# Patient Record
Sex: Male | Born: 1955
Health system: Southern US, Community
[De-identification: ages and names within clinical notes are randomized; demographics above are authoritative.]

## PROBLEM LIST (undated history)

## (undated) DIAGNOSIS — E7800 Pure hypercholesterolemia, unspecified: Secondary | ICD-10-CM

## (undated) DIAGNOSIS — R42 Dizziness and giddiness: Secondary | ICD-10-CM

## (undated) DIAGNOSIS — I10 Essential (primary) hypertension: Secondary | ICD-10-CM

---

## 2004-05-22 ENCOUNTER — Encounter: Admission: RE | Admit: 2004-05-22 | Discharge: 2004-05-22 | Payer: Self-pay | Admitting: Allergy and Immunology

## 2009-12-30 ENCOUNTER — Encounter: Admission: RE | Admit: 2009-12-30 | Discharge: 2009-12-30 | Payer: Self-pay | Admitting: Family Medicine

## 2011-06-21 ENCOUNTER — Other Ambulatory Visit: Payer: Self-pay | Admitting: Orthopaedic Surgery

## 2011-06-21 DIAGNOSIS — M25562 Pain in left knee: Secondary | ICD-10-CM

## 2011-06-26 ENCOUNTER — Ambulatory Visit
Admission: RE | Admit: 2011-06-26 | Discharge: 2011-06-26 | Disposition: A | Discharge status: Home / Self Care | Payer: Managed Care, Other (non HMO) | Source: Ambulatory Visit | Attending: Orthopaedic Surgery | Admitting: Orthopaedic Surgery

## 2011-06-26 DIAGNOSIS — M25562 Pain in left knee: Secondary | ICD-10-CM

## 2013-03-28 ENCOUNTER — Emergency Department (HOSPITAL_COMMUNITY): Payer: BC Managed Care – PPO

## 2013-03-28 ENCOUNTER — Emergency Department (HOSPITAL_COMMUNITY)
Admission: EM | Admit: 2013-03-28 | Discharge: 2013-03-28 | Disposition: A | Discharge status: Home / Self Care | Payer: BC Managed Care – PPO | Attending: Emergency Medicine | Admitting: Emergency Medicine

## 2013-03-28 ENCOUNTER — Encounter (HOSPITAL_COMMUNITY): Payer: Self-pay | Admitting: *Deleted

## 2013-03-28 DIAGNOSIS — I69998 Other sequelae following unspecified cerebrovascular disease: Secondary | ICD-10-CM | POA: Insufficient documentation

## 2013-03-28 DIAGNOSIS — Z79899 Other long term (current) drug therapy: Secondary | ICD-10-CM | POA: Insufficient documentation

## 2013-03-28 DIAGNOSIS — Z7982 Long term (current) use of aspirin: Secondary | ICD-10-CM | POA: Insufficient documentation

## 2013-03-28 DIAGNOSIS — I10 Essential (primary) hypertension: Secondary | ICD-10-CM | POA: Insufficient documentation

## 2013-03-28 DIAGNOSIS — R42 Dizziness and giddiness: Secondary | ICD-10-CM | POA: Insufficient documentation

## 2013-03-28 DIAGNOSIS — E7800 Pure hypercholesterolemia, unspecified: Secondary | ICD-10-CM | POA: Insufficient documentation

## 2013-03-28 HISTORY — DX: Essential (primary) hypertension: I10

## 2013-03-28 HISTORY — DX: Pure hypercholesterolemia, unspecified: E78.00

## 2013-03-28 LAB — URINALYSIS, ROUTINE W REFLEX MICROSCOPIC
Bilirubin Urine: NEGATIVE
Ketones, ur: NEGATIVE mg/dL
Nitrite: NEGATIVE

## 2013-03-28 LAB — CBC WITH DIFFERENTIAL/PLATELET
Basophils Absolute: 0.1 10*3/uL (ref 0.0–0.1)
Eosinophils Relative: 3 % (ref 0–5)
HCT: 37.7 % — ABNORMAL LOW (ref 39.0–52.0)
Hemoglobin: 13.5 g/dL (ref 13.0–17.0)
Lymphocytes Relative: 12 % (ref 12–46)
Lymphs Abs: 1.4 10*3/uL (ref 0.7–4.0)
MCHC: 35.8 g/dL (ref 30.0–36.0)
MCV: 85.1 fL (ref 78.0–100.0)
Monocytes Absolute: 0.8 10*3/uL (ref 0.1–1.0)
Monocytes Relative: 6 % (ref 3–12)
Neutro Abs: 9.3 10*3/uL — ABNORMAL HIGH (ref 1.7–7.7)
Neutrophils Relative %: 78 % — ABNORMAL HIGH (ref 43–77)
Platelets: 251 10*3/uL (ref 150–400)

## 2013-03-28 LAB — GLUCOSE, CAPILLARY: Glucose-Capillary: 133 mg/dL — ABNORMAL HIGH (ref 70–99)

## 2013-03-28 LAB — POCT I-STAT, CHEM 8
BUN: 21 mg/dL (ref 6–23)
Chloride: 102 mEq/L (ref 96–112)
Potassium: 3.1 mEq/L — ABNORMAL LOW (ref 3.5–5.1)
Sodium: 141 mEq/L (ref 135–145)

## 2013-03-28 LAB — URINE MICROSCOPIC-ADD ON

## 2013-03-28 NOTE — ED Notes (Signed)
Pt escorted to discharge window. Pt verbalized understanding discharge instructions. In no acute distress.  

## 2013-03-28 NOTE — Discharge Instructions (Signed)

## 2013-03-28 NOTE — ED Provider Notes (Signed)
History     CSN: [id number]  Arrival date & time 03/28/13  1245   First MD Initiated Contact with Patient 03/28/13 1516      Chief Complaint  Patient presents with  . Nausea  . not feeling well     (Consider location/radiation/quality/duration/timing/severity/associated sxs/prior treatment) The history is provided by the patient.   patient states that he was eating earlier today and began to feel "dizzy" he states he felt as if he was drunk. He states he then became nauseous and vomited. No chest pain. No lightheadedness. He states the nausea is much improved. He states he got up to walk and felt unsteady. No fevers. During his history of effusion. No difficulty seen. No numbness or weakness. He has not had episodes like this before. He states he was doing fine earlier today. He has history of hypertension high cholesterol. He has not had episodes like this before. Patient states that he vomited once.  Past Medical History  Diagnosis Date  . Hypertension   . High cholesterol     History reviewed. No pertinent past surgical history.  History reviewed. No pertinent family history.  History  Substance Use Topics  . Smoking status: Never Smoker   . Smokeless tobacco: Not on file  . Alcohol Use: Yes     Comment: socially      Review of Systems  Constitutional: Negative for activity change and appetite change.  HENT: Negative for neck stiffness.   Eyes: Negative for pain.  Respiratory: Negative for chest tightness and shortness of breath.   Cardiovascular: Negative for chest pain and leg swelling.  Gastrointestinal: Positive for nausea and vomiting. Negative for abdominal pain and diarrhea.  Genitourinary: Negative for flank pain.  Musculoskeletal: Negative for back pain.  Skin: Negative for rash.  Neurological: Positive for dizziness. Negative for weakness, numbness and headaches.  Psychiatric/Behavioral: Negative for behavioral problems.    Allergies   Penicillins  Home Medications   Current Outpatient Rx  Name  Route  Sig  Dispense  Refill  . aspirin 81 MG tablet   Oral   Take 81 mg by mouth daily.         . fenofibrate (TRICOR) 145 MG tablet   Oral   Take 145 mg by mouth daily.         Marland Kitchen lisinopril (PRINIVIL,ZESTRIL) 5 MG tablet   Oral   Take 5 mg by mouth daily.         Marland Kitchen zolpidem (AMBIEN) 5 MG tablet   Oral   Take 2.5 mg by mouth at bedtime as needed for sleep.           BP 167/95  Pulse 72  Temp(Src) 97.8 F (36.6 C) (Oral)  Resp 20  Ht 5\' 11"  (1.803 m)  Wt 238 lb (107.956 kg)  BMI 33.21 kg/m2  SpO2 100%  Physical Exam  Nursing note and vitals reviewed. Constitutional: He is oriented to person, place, and time. He appears well-developed and well-nourished.  HENT:  Head: Normocephalic and atraumatic.  Eyes: EOM are normal. Pupils are equal, round, and reactive to light.  Neck: Normal range of motion. Neck supple.  Cardiovascular: Normal rate, regular rhythm and normal heart sounds.   No murmur heard. Pulmonary/Chest: Effort normal and breath sounds normal.  Abdominal: Soft. Bowel sounds are normal. He exhibits no distension and no mass. There is no tenderness. There is no rebound and no guarding.  Musculoskeletal: Normal range of motion. He exhibits no edema.  Neurological:  He is alert and oriented to person, place, and time. No cranial nerve deficit. Coordination normal.  Extraocular movements intact. No nystagmus. No dizziness with rotating head. Finger-nose intact bilaterally. No Romberg. No pronator drift. He is able to ambulate normally.  Skin: Skin is warm and dry.  Psychiatric: He has a normal mood and affect.    ED Course  Procedures (including critical care time)  Labs Reviewed  GLUCOSE, CAPILLARY - Abnormal; Notable for the following:    Glucose-Capillary 133 (*)    All other components within normal limits  CBC WITH DIFFERENTIAL - Abnormal; Notable for the following:    WBC 11.9  (*)    HCT 37.7 (*)    Neutrophils Relative % 78 (*)    Neutro Abs 9.3 (*)    All other components within normal limits  URINALYSIS, ROUTINE W REFLEX MICROSCOPIC - Abnormal; Notable for the following:    Protein, ur 100 (*)    Leukocytes, UA TRACE (*)    All other components within normal limits  POCT I-STAT, CHEM 8 - Abnormal; Notable for the following:    Potassium 3.1 (*)    Glucose, Bld 121 (*)    All other components within normal limits  URINE MICROSCOPIC-ADD ON   Ct Head Wo Contrast  03/28/2013   *RADIOLOGY REPORT*  Clinical Data: Nausea.  Hypertension.  CT HEAD WITHOUT CONTRAST  Technique:  Contiguous axial images were obtained from the base of the skull through the vertex without contrast.  Comparison: None.  Findings: There is no evidence of acute intracranial abnormality including infarction, hemorrhage, mass lesion, mass effect, midline shift or abnormal extra-axial fluid collection.  No hydrocephalus or pneumocephalus.  The calvarium is intact.  Small mucous retention cyst or polyp in a posterior left ethmoid air cell is noted.  IMPRESSION: No acute finding.   Original Report Authenticated By: Holley Dexter, M.D.     1. Vertigo     Date: 03/28/2013  Rate: 58  Rhythm: normal sinus rhythm  QRS Axis: normal  Intervals: normal  ST/T Wave abnormalities: normal  Conduction Disutrbances:none  Narrative Interpretation: Q waves anteriorly.   Old EKG Reviewed: unchanged     MDM  Patient presents with dizziness. Normal exam now. States he felt drunk. Later states that he had a ringing in his ears. EKg and labs reassuring. Ct negative. Posterior stroke felt less likely. Will d/c with PCP follow up.   Juliet Rude. Rubin Payor, MD 03/28/13 [phone]

## 2013-03-28 NOTE — ED Notes (Signed)
Pt reports since 0800 he has been nauseas, clammy, and not feeling well. Reports he is diabetic but not at the point that he needs to take medications. Denies pain.

## 2013-08-21 ENCOUNTER — Emergency Department (HOSPITAL_COMMUNITY)
Admission: EM | Admit: 2013-08-21 | Discharge: 2013-08-21 | Disposition: A | Discharge status: Home / Self Care | Payer: BC Managed Care – PPO | Attending: Emergency Medicine | Admitting: Emergency Medicine

## 2013-08-21 DIAGNOSIS — Y9389 Activity, other specified: Secondary | ICD-10-CM | POA: Insufficient documentation

## 2013-08-21 DIAGNOSIS — I10 Essential (primary) hypertension: Secondary | ICD-10-CM | POA: Insufficient documentation

## 2013-08-21 DIAGNOSIS — E7800 Pure hypercholesterolemia, unspecified: Secondary | ICD-10-CM | POA: Insufficient documentation

## 2013-08-21 DIAGNOSIS — Z7982 Long term (current) use of aspirin: Secondary | ICD-10-CM | POA: Insufficient documentation

## 2013-08-21 DIAGNOSIS — W260XXA Contact with knife, initial encounter: Secondary | ICD-10-CM | POA: Insufficient documentation

## 2013-08-21 DIAGNOSIS — S61219A Laceration without foreign body of unspecified finger without damage to nail, initial encounter: Secondary | ICD-10-CM

## 2013-08-21 DIAGNOSIS — Z880 Allergy status to penicillin: Secondary | ICD-10-CM | POA: Insufficient documentation

## 2013-08-21 DIAGNOSIS — S61209A Unspecified open wound of unspecified finger without damage to nail, initial encounter: Secondary | ICD-10-CM | POA: Insufficient documentation

## 2013-08-21 DIAGNOSIS — Z79899 Other long term (current) drug therapy: Secondary | ICD-10-CM | POA: Insufficient documentation

## 2013-08-21 DIAGNOSIS — Y929 Unspecified place or not applicable: Secondary | ICD-10-CM | POA: Insufficient documentation

## 2013-08-21 NOTE — ED Notes (Signed)
Pt states he cut his L index finger skinning a deer. Pt states he has a lac to knuckle on L index finger. Bleeding controlled. Pt states he is up to date on his tetanus status.

## 2013-08-21 NOTE — ED Provider Notes (Signed)
CSN: [id number]     Arrival date & time 08/21/13  2058 History   First MD Initiated Contact with Patient 08/21/13 2112    This chart was scribed for Earley Favor NP, a non-physician practitioner working with Gavin Pound. Oletta Lamas, MD by Lewanda Rife, ED Scribe. This patient was seen in room WTR8/WTR8 and the patient's care was started at 9:44 PM     Chief Complaint  Patient presents with  . Extremity Laceration   (Consider location/radiation/quality/duration/timing/severity/associated sxs/prior Treatment) The history is provided by the patient. No language interpreter was used.   HPI Comments: Jonathan Fowler is a 57 y.o. male who presents to the Emergency Department complaining of posterior proximal left finger 1 cm laceration onset PTA with a skinning knife while hunting and skinning a deer. Reports associated constant mild pain to site. Denies any aggravating or alleviating factors. Denies associated other injuries. Reports tetanus is up to date.    Past Medical History  Diagnosis Date  . Hypertension   . High cholesterol    No past surgical history on file. No family history on file. History  Substance Use Topics  . Smoking status: Never Smoker   . Smokeless tobacco: Not on file  . Alcohol Use: Yes     Comment: socially    Review of Systems  Skin: Positive for wound.  All other systems reviewed and are negative.  A complete 10 system review of systems was obtained and all systems are negative except as noted in the HPI and PMHx.     Allergies  Penicillins  Home Medications   Current Outpatient Rx  Name  Route  Sig  Dispense  Refill  . aspirin 81 MG tablet   Oral   Take 81 mg by mouth every evening.          . fenofibrate (TRICOR) 145 MG tablet   Oral   Take 145 mg by mouth every evening.          Marland Kitchen lisinopril (PRINIVIL,ZESTRIL) 5 MG tablet   Oral   Take 5 mg by mouth every evening.          . potassium chloride (K-DUR) 10 MEQ tablet   Oral    Take 10 mEq by mouth 2 (two) times daily.         Marland Kitchen zolpidem (AMBIEN) 5 MG tablet   Oral   Take 2.5 mg by mouth at bedtime as needed for sleep.          BP 163/87  Pulse 80  Temp(Src) 99.1 F (37.3 C) (Oral)  Resp 16  SpO2 98% Physical Exam  Nursing note and vitals reviewed. Constitutional: He is oriented to person, place, and time. He appears well-developed and well-nourished. No distress.  HENT:  Head: Normocephalic and atraumatic.  Eyes: EOM are normal.  Neck: Neck supple. No tracheal deviation present.  Cardiovascular: Normal rate.   Pulmonary/Chest: Effort normal. No respiratory distress.  Musculoskeletal: Normal range of motion.  Neurological: He is alert and oriented to person, place, and time.  Skin: Skin is warm and dry.  Diagonal laceration along proximal left index  Psychiatric: He has a normal mood and affect. His behavior is normal.    ED Course  Procedures (including critical care time) COORDINATION OF CARE:  Nursing notes reviewed. Vital signs reviewed. Initial pt interview and examination performed.   9:44 PM-Discussed treatment plan with pt at bedside, which includes laceration repair. Pt agrees with plan.  LACERATION REPAIR Performed by: Earley Favor  NP Consent: Verbal consent obtained. Risks and benefits: risks, benefits and alternatives were discussed Patient identity confirmed: provided demographic data Time out performed prior to procedure Prepped and Draped in normal sterile fashion Wound explored Laceration Location: posterior proximal left index finger  Laceration Length: 1 cm No Foreign Bodies seen or palpated Anesthesia: local infiltration Local anesthetic: lidocaine 2% without epinephrine Anesthetic total: 1 ml Irrigation method: syringe Amount of cleaning: standard Skin closure: 5-0 Prolene Number of sutures or staples: 3 sutures Technique: simple interrupted Patient tolerance: Patient tolerated the procedure well with no  immediate complications.  Treatment plan initiated:Medications - No data to display   Initial diagnostic testing ordered.    Labs Review Labs Reviewed - No data to display Imaging Review No results found.  EKG Interpretation   None       MDM   1. Laceration of finger, initial encounter    Laceration if finger  I personally performed the services described in this documentation, which was scribed in my presence. The recorded information has been reviewed and is accurate.   Arman Filter, NP 08/21/13 2144

## 2013-08-22 NOTE — ED Provider Notes (Signed)
Medical screening examination/treatment/procedure(s) were performed by non-physician practitioner and as supervising physician I was immediately available for consultation/collaboration.  EKG Interpretation   None         Gavin Pound. Oletta Lamas, MD 08/22/13 8119

## 2015-07-04 ENCOUNTER — Other Ambulatory Visit (HOSPITAL_COMMUNITY): Payer: Self-pay | Admitting: Nephrology

## 2015-07-05 ENCOUNTER — Other Ambulatory Visit (HOSPITAL_COMMUNITY): Payer: Self-pay | Admitting: Nephrology

## 2015-07-05 DIAGNOSIS — E269 Hyperaldosteronism, unspecified: Secondary | ICD-10-CM

## 2015-07-12 ENCOUNTER — Ambulatory Visit (HOSPITAL_COMMUNITY): Payer: Self-pay

## 2015-07-21 ENCOUNTER — Ambulatory Visit (HOSPITAL_COMMUNITY): Payer: BLUE CROSS/BLUE SHIELD

## 2015-07-28 ENCOUNTER — Ambulatory Visit (HOSPITAL_COMMUNITY)
Admission: RE | Admit: 2015-07-28 | Discharge: 2015-07-28 | Disposition: A | Discharge status: Home / Self Care | Payer: BLUE CROSS/BLUE SHIELD | Source: Ambulatory Visit | Attending: Nephrology | Admitting: Nephrology

## 2015-07-28 DIAGNOSIS — K5730 Diverticulosis of large intestine without perforation or abscess without bleeding: Secondary | ICD-10-CM | POA: Diagnosis not present

## 2015-07-28 DIAGNOSIS — R109 Unspecified abdominal pain: Secondary | ICD-10-CM | POA: Insufficient documentation

## 2015-07-28 DIAGNOSIS — I70203 Unspecified atherosclerosis of native arteries of extremities, bilateral legs: Secondary | ICD-10-CM | POA: Insufficient documentation

## 2015-07-28 DIAGNOSIS — I2510 Atherosclerotic heart disease of native coronary artery without angina pectoris: Secondary | ICD-10-CM | POA: Diagnosis not present

## 2015-07-28 DIAGNOSIS — M5136 Other intervertebral disc degeneration, lumbar region: Secondary | ICD-10-CM | POA: Insufficient documentation

## 2015-07-28 DIAGNOSIS — K429 Umbilical hernia without obstruction or gangrene: Secondary | ICD-10-CM | POA: Diagnosis not present

## 2015-07-28 DIAGNOSIS — N400 Enlarged prostate without lower urinary tract symptoms: Secondary | ICD-10-CM | POA: Diagnosis not present

## 2015-07-28 DIAGNOSIS — E269 Hyperaldosteronism, unspecified: Secondary | ICD-10-CM

## 2015-07-28 DIAGNOSIS — E279 Disorder of adrenal gland, unspecified: Secondary | ICD-10-CM | POA: Diagnosis not present

## 2016-05-22 ENCOUNTER — Other Ambulatory Visit: Payer: Self-pay | Admitting: Orthopedic Surgery

## 2016-05-22 DIAGNOSIS — M25532 Pain in left wrist: Secondary | ICD-10-CM

## 2016-05-29 ENCOUNTER — Ambulatory Visit
Admission: RE | Admit: 2016-05-29 | Discharge: 2016-05-29 | Disposition: A | Discharge status: Home / Self Care | Payer: BLUE CROSS/BLUE SHIELD | Source: Ambulatory Visit | Attending: Orthopedic Surgery | Admitting: Orthopedic Surgery

## 2016-05-29 DIAGNOSIS — M25532 Pain in left wrist: Secondary | ICD-10-CM

## 2016-10-31 DIAGNOSIS — R6889 Other general symptoms and signs: Secondary | ICD-10-CM | POA: Diagnosis not present

## 2016-10-31 DIAGNOSIS — R5383 Other fatigue: Secondary | ICD-10-CM | POA: Diagnosis not present

## 2016-10-31 DIAGNOSIS — R69 Illness, unspecified: Secondary | ICD-10-CM | POA: Diagnosis not present

## 2016-11-04 DIAGNOSIS — R69 Illness, unspecified: Secondary | ICD-10-CM | POA: Diagnosis not present

## 2016-11-04 DIAGNOSIS — R030 Elevated blood-pressure reading, without diagnosis of hypertension: Secondary | ICD-10-CM | POA: Diagnosis not present

## 2016-11-18 DIAGNOSIS — I10 Essential (primary) hypertension: Secondary | ICD-10-CM | POA: Diagnosis not present

## 2016-11-18 DIAGNOSIS — E782 Mixed hyperlipidemia: Secondary | ICD-10-CM | POA: Diagnosis not present

## 2016-11-18 DIAGNOSIS — E119 Type 2 diabetes mellitus without complications: Secondary | ICD-10-CM | POA: Diagnosis not present

## 2016-11-18 DIAGNOSIS — E269 Hyperaldosteronism, unspecified: Secondary | ICD-10-CM | POA: Diagnosis not present

## 2017-01-13 DIAGNOSIS — E269 Hyperaldosteronism, unspecified: Secondary | ICD-10-CM | POA: Diagnosis not present

## 2017-01-13 DIAGNOSIS — R809 Proteinuria, unspecified: Secondary | ICD-10-CM | POA: Diagnosis not present

## 2017-01-13 DIAGNOSIS — I152 Hypertension secondary to endocrine disorders: Secondary | ICD-10-CM | POA: Diagnosis not present

## 2017-04-28 DIAGNOSIS — L237 Allergic contact dermatitis due to plants, except food: Secondary | ICD-10-CM | POA: Diagnosis not present

## 2017-04-28 DIAGNOSIS — I10 Essential (primary) hypertension: Secondary | ICD-10-CM | POA: Diagnosis not present

## 2017-05-17 DIAGNOSIS — R21 Rash and other nonspecific skin eruption: Secondary | ICD-10-CM | POA: Diagnosis not present

## 2017-05-23 DIAGNOSIS — R809 Proteinuria, unspecified: Secondary | ICD-10-CM | POA: Diagnosis not present

## 2017-05-23 DIAGNOSIS — E269 Hyperaldosteronism, unspecified: Secondary | ICD-10-CM | POA: Diagnosis not present

## 2017-05-23 DIAGNOSIS — I152 Hypertension secondary to endocrine disorders: Secondary | ICD-10-CM | POA: Diagnosis not present

## 2017-06-13 DIAGNOSIS — E269 Hyperaldosteronism, unspecified: Secondary | ICD-10-CM | POA: Diagnosis not present

## 2017-06-23 DIAGNOSIS — E782 Mixed hyperlipidemia: Secondary | ICD-10-CM | POA: Diagnosis not present

## 2017-06-23 DIAGNOSIS — E119 Type 2 diabetes mellitus without complications: Secondary | ICD-10-CM | POA: Diagnosis not present

## 2017-06-23 DIAGNOSIS — Z23 Encounter for immunization: Secondary | ICD-10-CM | POA: Diagnosis not present

## 2017-06-23 DIAGNOSIS — I10 Essential (primary) hypertension: Secondary | ICD-10-CM | POA: Diagnosis not present

## 2017-08-11 ENCOUNTER — Ambulatory Visit (INDEPENDENT_AMBULATORY_CARE_PROVIDER_SITE_OTHER): Payer: 59

## 2017-08-11 ENCOUNTER — Encounter (INDEPENDENT_AMBULATORY_CARE_PROVIDER_SITE_OTHER): Payer: Self-pay | Admitting: Physician Assistant

## 2017-08-11 ENCOUNTER — Ambulatory Visit (INDEPENDENT_AMBULATORY_CARE_PROVIDER_SITE_OTHER): Payer: 59 | Admitting: Physician Assistant

## 2017-08-11 DIAGNOSIS — M7022 Olecranon bursitis, left elbow: Secondary | ICD-10-CM

## 2017-08-11 DIAGNOSIS — M25522 Pain in left elbow: Secondary | ICD-10-CM | POA: Diagnosis not present

## 2017-08-11 MED ORDER — METHYLPREDNISOLONE ACETATE 40 MG/ML IJ SUSP
40.0000 mg | INTRAMUSCULAR | Status: AC | PRN
  Administered 2017-08-11: 40 mg

## 2017-08-11 MED ORDER — LIDOCAINE HCL 1 % IJ SOLN
0.3000 mL | INTRAMUSCULAR | Status: AC | PRN
  Administered 2017-08-11: .3 mL

## 2017-08-11 NOTE — Progress Notes (Signed)
Office Visit Note   Patient: Jonathan Fowler           Date of Birth: [birth year]           MRN: [id number] Visit Date: 08/11/2017              Requested by: No referring provider defined for this encounter. PCP: No primary care provider on file.   Assessment & Plan: Visit Diagnoses:  1. Pain in left elbow     Plan: Left elbow is wrapped with an Ace bandage post aspiration injection.  We will leave this in fact until this evening and then remove it.  I talked with him about keeping pressure off the elbow.  He will follow-up with us in 2 weeks to check his progress lack of.  However he states that he will cancel appointment if he is doing well.  Questions were encouraged and answered by myself and Dr. Magnus IvanBlackman.  Follow-Up Instructions: No Follow-up on file.   Orders:  Orders Placed This Encounter  Procedures  . Hand/Upper Extremity Injection/Arthrocentesis  . XR Elbow 2 Views Left   No orders of the defined types were placed in this encounter.     Procedures: Hand/UE Inj Date/Time: 08/11/2017 1:53 PM Performed by: Kirtland BouchardLARK, Vernessa Likes W Authorized by: Kirtland BouchardLARK, Neleh Muldoon W   Consent Given by:  Patient Site marked: the procedure site was marked   Indications:  Joint swelling Condition comment:  Olecranon bursitis Needle Size:  25 G Approach:  Dorsal Medications:  40 mg methylPREDNISolone acetate 40 MG/ML; 0.3 mL lidocaine 1 % Aspirate amount (mL):  35 Aspirate:  Blood-tinged Patient tolerance:  Patient tolerated the procedure well with no immediate complications     Clinical Data: No additional findings.   Subjective: Elbow swelling  HPI Patient is a 61 year old male who comes in today with left elbow swelling for the past 2-3 weeks.  He is having no pain.  Had no fevers chills.  He does state that he hit the elbow on a dumpster.  No other injury.  Had no treatment for this. Review of Systems No fevers, chills, shortness of breath OR chest pain.  Objective: Vital  Signs: There were no vitals taken for this visit.  Physical Exam  Constitutional: He is oriented to person, place, and time. He appears well-developed and well-nourished. No distress.  Pulmonary/Chest: Effort normal.  Neurological: He is alert and oriented to person, place, and time.  Skin: He is not diaphoretic.  Psychiatric: He has a normal mood and affect.    Ortho Exam Left elbow olecranon bursitis.  No significant erythema positive edema.  Good range of motion the elbow and forearm without pain.  No rashes skin lesions ulcerations erythema.  No expressible purulence. Specialty Comments:  No specialty comments available.  Imaging: Xr Elbow 2 Views Left  Result Date: 08/11/2017 Left elbow AP lateral views: Elbow is well located.  No arthritic changes.  No evidence of avulsion or acute fracture.  No bony abnormalities otherwise.    PMFS History: There are no active problems to display for this patient.  Past Medical History:  Diagnosis Date  . High cholesterol   . Hypertension     No family history on file.  No past surgical history on file. Social History   Occupational History  . Not on file.   Social History Main Topics  . Smoking status: Never Smoker  . Smokeless tobacco: Not on file  . Alcohol use Yes  Comment: socially  . Drug use: No  . Sexual activity: Not on file

## 2017-08-25 ENCOUNTER — Ambulatory Visit (INDEPENDENT_AMBULATORY_CARE_PROVIDER_SITE_OTHER): Payer: 59 | Admitting: Orthopaedic Surgery

## 2017-08-25 ENCOUNTER — Encounter (INDEPENDENT_AMBULATORY_CARE_PROVIDER_SITE_OTHER): Payer: Self-pay | Admitting: Orthopaedic Surgery

## 2017-08-25 DIAGNOSIS — M7022 Olecranon bursitis, left elbow: Secondary | ICD-10-CM | POA: Insufficient documentation

## 2017-08-25 NOTE — Progress Notes (Signed)
The patient is following up for his left elbow olecranon bursitis.  At his last visit we drained about 35 cc of fluid off the left elbow area and place a steroid in it.  He has had some reaccumulation but overall is doing well.  On examination of his elbow there is no evidence of infection.  His elbow range of motion is full.  There is still some fluid in the olecranon bursa area but is not is much as before.  I was able to place an 18-gauge needle and aspirate only about 5 cc of bloody fluid from the olecranon area and express more from it as well.  This completely decompressed this area.  He will follow-up as needed however if he has a reaccumulation of fluid in the left elbow we can see him back and drain it one more time with placing 1 more steroid injection if needed.  All questions concerns were answered and addressed.

## 2017-10-29 ENCOUNTER — Ambulatory Visit (INDEPENDENT_AMBULATORY_CARE_PROVIDER_SITE_OTHER): Payer: 59 | Admitting: Physician Assistant

## 2017-10-29 ENCOUNTER — Ambulatory Visit (INDEPENDENT_AMBULATORY_CARE_PROVIDER_SITE_OTHER): Payer: 59

## 2017-10-29 ENCOUNTER — Encounter (INDEPENDENT_AMBULATORY_CARE_PROVIDER_SITE_OTHER): Payer: Self-pay | Admitting: Physician Assistant

## 2017-10-29 DIAGNOSIS — M25562 Pain in left knee: Secondary | ICD-10-CM | POA: Diagnosis not present

## 2017-10-29 DIAGNOSIS — G8929 Other chronic pain: Secondary | ICD-10-CM | POA: Diagnosis not present

## 2017-10-29 MED ORDER — METHYLPREDNISOLONE ACETATE 40 MG/ML IJ SUSP
40.0000 mg | INTRAMUSCULAR | Status: AC | PRN
  Administered 2017-10-29: 40 mg via INTRA_ARTICULAR

## 2017-10-29 MED ORDER — LIDOCAINE HCL 1 % IJ SOLN
5.0000 mL | INTRAMUSCULAR | Status: AC | PRN
  Administered 2017-10-29: 5 mL

## 2017-10-29 NOTE — Progress Notes (Signed)
Office Visit Note   Patient: Jonathan Fowler           Date of Birth: 1956/07/08           MRN: [id number] Visit Date: 10/29/2017              Requested by: No referring provider defined for this encounter. PCP: Jonathan Fowler, [place], MD   Assessment & Plan: Visit Diagnoses:  1. Chronic pain of left knee     Plan: Discussed with him knee friendly exercises.  Demonstrated some quad strengthening exercises and had him redemonstrate days.  Discussed over-the-counter anti-inflammatories and natural anti-inflammatories.  He will continue his Aleve and glucosamine.  Also discussed with him supplemental injections and handout was given on supplemental injections.  He will follow-up on as-needed basis pain persist or becomes worse.  Questions encouraged and answered at length.  Follow-Up Instructions: Return if symptoms worsen or fail to improve.   Orders:  Orders Placed This Encounter  Procedures  . Large Joint Inj  . XR KNEE 3 VIEW LEFT   No orders of the defined types were placed in this encounter.     Procedures: Large Joint Inj: L knee on 10/29/2017 8:08 PM Indications: pain Details: 22 G 1.5 in needle, anterolateral approach  Arthrogram: No  Medications: 40 mg methylPREDNISolone acetate 40 MG/ML; 5 mL lidocaine 1 % Aspirate: 12 mL yellow Outcome: tolerated well, no immediate complications Procedure, treatment alternatives, risks and benefits explained, specific risks discussed. Consent was given by the patient. Immediately prior to procedure a time out was called to verify the correct patient, procedure, equipment, support staff and site/side marked as required. Patient was prepped and draped in the usual sterile fashion.       Clinical Data: No additional findings.   Subjective: Chief Complaint  Patient presents with  . Left Knee - Pain    HPI Jonathan Fowler 62 year old male is well-known to Dr. Magnus Fowler service comes in today due to left knee pain secondary to  fall onto the knee.  Now he is having knee pain especially with kneeling down.  He notes some giving way .  Denies painful popping, locking catching.  He is tried Aleve with some relief.  States the knee feels stiff. Review of Systems No fevers chills chest pain shortness of breath.  Objective: Vital Signs: There were no vitals taken for this visit.  Physical Exam  Constitutional: He is oriented to person, place, and time. He appears well-developed and well-nourished. No distress.  Pulmonary/Chest: Effort normal.  Neurological: He is alert and oriented to person, place, and time.  Skin: He is not diaphoretic.  Psychiatric: He has a normal mood and affect.    Ortho Exam Bilateral knees no instability valgus varus stressing.  Is able to do straight leg raise bilaterally.  McMurray's is negative bilaterally.  Left knee with tenderness along medial joint line.  Slight effusion left knee.  Right knee with slight tenderness along medial joint line. Specialty Comments:  No specialty comments available.  Imaging: Xr Knee 3 View Left  Result Date: 10/29/2017 3 views left knee: No acute fractures.  Near bone-on-bone medial compartment.  Mild lateral compartmental changes.  Moderate patellofemoral changes.  No other bony abnormalities.  Slight lateralization of the patella on the sunrise view.    PMFS History: Patient Active Problem List   Diagnosis Date Noted  . Olecranon bursitis of left elbow 08/25/2017   Past Medical History:  Diagnosis Date  . High cholesterol   .  Hypertension     History reviewed. No pertinent family history.  History reviewed. No pertinent surgical history. Social History   Occupational History  . Not on file  Tobacco Use  . Smoking status: Never Smoker  . Smokeless tobacco: Never Used  Substance and Sexual Activity  . Alcohol use: Yes    Comment: socially  . Drug use: No  . Sexual activity: Not on file

## 2017-11-01 DIAGNOSIS — L237 Allergic contact dermatitis due to plants, except food: Secondary | ICD-10-CM | POA: Diagnosis not present

## 2017-11-06 DIAGNOSIS — I152 Hypertension secondary to endocrine disorders: Secondary | ICD-10-CM | POA: Diagnosis not present

## 2017-11-06 DIAGNOSIS — E1121 Type 2 diabetes mellitus with diabetic nephropathy: Secondary | ICD-10-CM | POA: Diagnosis not present

## 2017-11-06 DIAGNOSIS — N2581 Secondary hyperparathyroidism of renal origin: Secondary | ICD-10-CM | POA: Diagnosis not present

## 2017-11-12 DIAGNOSIS — I152 Hypertension secondary to endocrine disorders: Secondary | ICD-10-CM | POA: Diagnosis not present

## 2017-11-12 DIAGNOSIS — N2581 Secondary hyperparathyroidism of renal origin: Secondary | ICD-10-CM | POA: Diagnosis not present

## 2017-11-12 DIAGNOSIS — R809 Proteinuria, unspecified: Secondary | ICD-10-CM | POA: Diagnosis not present

## 2018-01-16 DIAGNOSIS — L255 Unspecified contact dermatitis due to plants, except food: Secondary | ICD-10-CM | POA: Diagnosis not present

## 2018-04-02 DIAGNOSIS — R809 Proteinuria, unspecified: Secondary | ICD-10-CM | POA: Diagnosis not present

## 2018-04-02 DIAGNOSIS — I152 Hypertension secondary to endocrine disorders: Secondary | ICD-10-CM | POA: Diagnosis not present

## 2018-04-06 DIAGNOSIS — R809 Proteinuria, unspecified: Secondary | ICD-10-CM | POA: Diagnosis not present

## 2018-04-06 DIAGNOSIS — N2581 Secondary hyperparathyroidism of renal origin: Secondary | ICD-10-CM | POA: Diagnosis not present

## 2018-04-06 DIAGNOSIS — I152 Hypertension secondary to endocrine disorders: Secondary | ICD-10-CM | POA: Diagnosis not present

## 2018-05-06 DIAGNOSIS — L255 Unspecified contact dermatitis due to plants, except food: Secondary | ICD-10-CM | POA: Diagnosis not present

## 2018-05-06 DIAGNOSIS — I10 Essential (primary) hypertension: Secondary | ICD-10-CM | POA: Diagnosis not present

## 2018-05-14 ENCOUNTER — Emergency Department (HOSPITAL_COMMUNITY)
Admission: EM | Admit: 2018-05-14 | Discharge: 2018-05-14 | Disposition: A | Discharge status: Home / Self Care | Payer: 59 | Attending: Emergency Medicine | Admitting: Emergency Medicine

## 2018-05-14 ENCOUNTER — Emergency Department (HOSPITAL_COMMUNITY): Payer: 59

## 2018-05-14 ENCOUNTER — Encounter (HOSPITAL_COMMUNITY): Payer: Self-pay | Admitting: Emergency Medicine

## 2018-05-14 DIAGNOSIS — I10 Essential (primary) hypertension: Secondary | ICD-10-CM | POA: Insufficient documentation

## 2018-05-14 DIAGNOSIS — Z7982 Long term (current) use of aspirin: Secondary | ICD-10-CM | POA: Insufficient documentation

## 2018-05-14 DIAGNOSIS — R42 Dizziness and giddiness: Secondary | ICD-10-CM | POA: Diagnosis present

## 2018-05-14 DIAGNOSIS — R51 Headache: Secondary | ICD-10-CM | POA: Insufficient documentation

## 2018-05-14 DIAGNOSIS — Z79899 Other long term (current) drug therapy: Secondary | ICD-10-CM | POA: Insufficient documentation

## 2018-05-14 HISTORY — DX: Dizziness and giddiness: R42

## 2018-05-14 LAB — CBC
HCT: 43.6 % (ref 39.0–52.0)
Hemoglobin: 14.6 g/dL (ref 13.0–17.0)
MCH: 30.2 pg (ref 26.0–34.0)
MCHC: 33.5 g/dL (ref 30.0–36.0)
MCV: 90.3 fL (ref 78.0–100.0)
PLATELETS: 218 10*3/uL (ref 150–400)
RBC: 4.83 MIL/uL (ref 4.22–5.81)
RDW: 12.1 % (ref 11.5–15.5)
WBC: 7.7 10*3/uL (ref 4.0–10.5)

## 2018-05-14 LAB — BASIC METABOLIC PANEL
Anion gap: 9 (ref 5–15)
BUN: 16 mg/dL (ref 8–23)
CALCIUM: 9.3 mg/dL (ref 8.9–10.3)
CO2: 26 mmol/L (ref 22–32)
CREATININE: 1 mg/dL (ref 0.61–1.24)
Chloride: 104 mmol/L (ref 98–111)
GFR calc non Af Amer: >60 mL/min (ref >60)
GLUCOSE: 131 mg/dL — AB (ref 70–99)
Potassium: 3.6 mmol/L (ref 3.5–5.1)
Sodium: 139 mmol/L (ref 135–145)

## 2018-05-14 LAB — URINALYSIS, ROUTINE W REFLEX MICROSCOPIC
Bacteria, UA: NONE SEEN
Bilirubin Urine: NEGATIVE
GLUCOSE, UA: NEGATIVE mg/dL
Hgb urine dipstick: NEGATIVE
Ketones, ur: NEGATIVE mg/dL
Nitrite: NEGATIVE
PROTEIN: 30 mg/dL — AB
Specific Gravity, Urine: 1.011 (ref 1.005–1.030)
pH: 5 (ref 5.0–8.0)

## 2018-05-14 LAB — TROPONIN I

## 2018-05-14 LAB — CBG MONITORING, ED: Glucose-Capillary: 99 mg/dL (ref 70–99)

## 2018-05-14 MED ORDER — ACETAMINOPHEN 325 MG PO TABS
650.0000 mg | ORAL_TABLET | Freq: Once | ORAL | Status: AC
  Administered 2018-05-14: 650 mg via ORAL
  Filled 2018-05-14: qty 2

## 2018-05-14 MED ORDER — METOCLOPRAMIDE HCL 5 MG/ML IJ SOLN
5.0000 mg | Freq: Once | INTRAMUSCULAR | Status: AC
  Administered 2018-05-14: 5 mg via INTRAMUSCULAR
  Filled 2018-05-14: qty 2

## 2018-05-14 MED ORDER — ONDANSETRON 4 MG PO TBDP: 4.0000 mg | ORAL_TABLET | Freq: Three times a day (TID) | ORAL | 0 refills | Status: DC | PRN

## 2018-05-14 MED ORDER — MECLIZINE HCL 25 MG PO TABS: 25.0000 mg | ORAL_TABLET | Freq: Three times a day (TID) | ORAL | 0 refills | Status: DC | PRN

## 2018-05-14 MED ORDER — DIPHENHYDRAMINE HCL 50 MG/ML IJ SOLN
25.0000 mg | Freq: Once | INTRAMUSCULAR | Status: AC
  Administered 2018-05-14: 25 mg via INTRAMUSCULAR
  Filled 2018-05-14: qty 1

## 2018-05-14 NOTE — ED Triage Notes (Signed)
Pt reports for the last week he has felt like he had no energy. Pt states when he woke up this morning and stood up he felt dizzy and off balance. Pt took bp at home and was 170's sp came to ED. Pt ambulatory no trouble walking and no weakness or numbness. Pt also has headache 3/10.

## 2018-05-14 NOTE — ED Notes (Signed)
Pt ambulated with no issues. Pt reports no dizziness,.

## 2018-05-14 NOTE — ED Notes (Signed)
Pt alert and oriented in NAD. Pt verbalized understanding of discharge instructions. 

## 2018-05-14 NOTE — Discharge Instructions (Signed)
Take the prescriptions as directed. Continue to take your usual medications as previously directed.  Call your regular medical doctor tomorrow to schedule a follow up appointment within the next 3 days. Call the ENT doctor tomorrow to schedule a follow up appointment within the next week.  Return to the Emergency Department immediately sooner if worsening.

## 2018-05-14 NOTE — ED Notes (Signed)
Requested pain medication for pt headache from MD. Awaiting orders.

## 2018-05-14 NOTE — ED Notes (Signed)
Patient transported to CT 

## 2018-05-14 NOTE — ED Provider Notes (Signed)
[facility] EMERGENCY DEPARTMENT Provider Note   CSN: [id number] Arrival date & time: 05/14/18  0945     History   Chief Complaint Chief Complaint  Patient presents with  . Dizziness    HPI Jonathan Fowler is a 62 y.o. male.  HPI  Pt was seen at 1330. Per pt and his wife, c/o sudden onset and resolution of one episode of "dizziness" that occurred when he woke up this morning approximately 7am. Pt states he woke up, stood up and felt "dizzy" and "off balance." Pt describes the dizziness as "everything was moving."  Pt states his symptoms have resolved by arrival to ED. Pt states he only has a dull aching vertex headache. Endorses hx of vertigo.  Denies headache was sudden or maximal in onset or at any time.  Denies CP/palpitations, no SOB/cough, no abd pain, no N/V/D, no focal motor weakness, no tingling/numbness in extremities, no visual changes, no facial droop, no slurred speech, no fevers, no rash, no neck or back pain.     Past Medical History:  Diagnosis Date  . High cholesterol   . Hypertension   . Vertigo     Patient Active Problem List   Diagnosis Date Noted  . Olecranon bursitis of left elbow 08/25/2017    No past surgical history on file.      Home Medications    Prior to Admission medications   Medication Sig Start Date End Date Taking? Authorizing Provider  amLODipine (NORVASC) 5 MG tablet Take 5 mg by mouth daily.    [provider]  aspirin 81 MG tablet Take 81 mg by mouth every evening.     [provider]  fenofibrate (TRICOR) 145 MG tablet Take 145 mg by mouth every evening.     [provider]  hydrALAZINE (APRESOLINE) 50 MG tablet Take 50 mg by mouth 3 (three) times daily.    [provider]  irbesartan (AVAPRO) 300 MG tablet Take 300 mg by mouth daily.    [provider]  lisinopril (PRINIVIL,ZESTRIL) 5 MG tablet Take 5 mg by mouth every evening.     [provider]    valsartan (DIOVAN) 160 MG tablet Take 160 mg by mouth daily.    [provider]  zolpidem (AMBIEN) 5 MG tablet Take 2.5 mg by mouth at bedtime as needed for sleep.    [provider]    Family History No family history on file.  Social History Social History   Tobacco Use  . Smoking status: Never Smoker  . Smokeless tobacco: Never Used  Substance Use Topics  . Alcohol use: Yes    Comment: socially  . Drug use: No     Allergies   Penicillins   Review of Systems Review of Systems ROS: Statement: All systems negative except as marked or noted in the HPI; Constitutional: Negative for fever and chills. ; ; Eyes: Negative for eye pain, redness and discharge. ; ; ENMT: Negative for ear pain, hoarseness, nasal congestion, sinus pressure and sore throat. ; ; Cardiovascular: Negative for chest pain, palpitations, diaphoresis, dyspnea and peripheral edema. ; ; Respiratory: Negative for cough, wheezing and stridor. ; ; Gastrointestinal: Negative for nausea, vomiting, diarrhea, abdominal pain, blood in stool, hematemesis, jaundice and rectal bleeding. . ; ; Genitourinary: Negative for dysuria, flank pain and hematuria. ; ; Musculoskeletal: Negative for back pain and neck pain. Negative for swelling and trauma.; ; Skin: Negative for pruritus, rash, abrasions, blisters, bruising and skin  lesion.; ; Neuro: +vertigo, headache. Negative for lightheadedness and neck stiffness. Negative for weakness, altered level of consciousness, altered mental status, extremity weakness, paresthesias, involuntary movement, seizure and syncope.       Physical Exam Updated Vital Signs BP 138/89   Pulse 66   Temp 98 F (36.7 C) (Oral)   Resp (!) 21   SpO2 97%    Patient Vitals for the past 24 hrs:  BP Temp Temp src Pulse Resp SpO2  05/14/18 1630 138/83 - - (!) 55 15 95 %  05/14/18 1615 (!) 146/86 - - 61 15 95 %  05/14/18 1600 (!) 150/79 - - (!) 54 17 96 %  05/14/18 1545 (!) 147/86 - - 64  17 95 %  05/14/18 1530 138/85 - - 60 15 96 %  05/14/18 1515 136/83 - - (!) 59 16 97 %  05/14/18 1400 (!) 152/82 - - (!) 58 20 96 %  05/14/18 1345 138/89 - - 66 (!) 21 97 %  05/14/18 1330 128/88 - - 63 14 98 %  05/14/18 1315 (!) 144/82 - - (!) 53 15 96 %  05/14/18 1300 (!) 150/89 - - (!) 55 18 100 %  05/14/18 1230 (!) 146/91 - - 66 18 99 %  05/14/18 1215 (!) 163/96 - - - 17 -  05/14/18 1200 (!) 151/91 - - 68 20 98 %  05/14/18 1151 (!) 143/84 - - 60 (!) 21 98 %  05/14/18 1012 (!) 146/96 - - - - -  05/14/18 1011 - 98 F (36.7 C) Oral 79 16 98 %    Physical Exam 1335: Physical examination:  Nursing notes reviewed; Vital signs and O2 SAT reviewed;  Constitutional: Well developed, Well nourished, Well hydrated, In no acute distress; Head:  Normocephalic, atraumatic; Eyes: EOMI, PERRL, No scleral icterus; ENMT: TM's clear bilat. +edemetous nasal turbinates bilat with clear rhinorrhea. Mouth and pharynx normal, Mucous membranes moist; Neck: Supple, Full range of motion, No lymphadenopathy. No meningeal signs.; Cardiovascular: Regular rate and rhythm, No gallop; Respiratory: Breath sounds clear & equal bilaterally, No wheezes.  Speaking full sentences with ease, Normal respiratory effort/excursion; Chest: Nontender, Movement normal; Abdomen: Soft, Nontender, Nondistended, Normal bowel sounds; Genitourinary: No CVA tenderness; Extremities: Peripheral pulses normal, No tenderness, No edema, No calf edema or asymmetry.; Neuro: AA&Ox3, Major CN grossly intact. Speech clear.  No facial droop. +right horizontal end gaze fatigable nystagmus. Grips equal. Strength 5/5 equal bilat UE's and LE's.  DTR 2/4 equal bilat UE's and LE's.  No gross sensory deficits.  Normal cerebellar testing bilat UE's (finger-nose) and LE's (heel-shin)..; Skin: Color normal, Warm, Dry.   ED Treatments / Results  Labs (all labs ordered are listed, but only abnormal results are displayed)   EKG EKG  Interpretation  Date/Time:  Thursday May 14 2018 09:50:46 EDT Ventricular Rate:  78 PR Interval:    QRS Duration: 144 QT Interval:  422 QTC Calculation: 481 R Axis:   -24 Text Interpretation:  Wide QRS rhythm with occasional Premature ventricular complexes Left ventricular hypertrophy with QRS widening and repolarization abnormality Cannot rule out Septal infarct , age undetermined Abnormal ECG Confirmed by Melene PlanFloyd, Dan [phone]), editor Josephine IgoBelcher, Jessica ([phone]) on 05/14/2018 1:36:29 PM     EKG Interpretation  Date/Time:  Thursday May 14 2018 14:27:55 EDT Ventricular Rate:  59 PR Interval:    QRS Duration: 149 QT Interval:  458 QTC Calculation: 454 R Axis:   -44 Text Interpretation:  Sinus rhythm Left bundle branch block When compared  with ECG of 03/28/2013 Non-specific intra-ventricular conduction delay is now Left bundle branch block Otherwise no significant change Confirmed by [name] [phone]) on 05/14/2018 2:34:01 PM         Radiology   Procedures Procedures (including critical care time)  Medications Ordered in ED Medications  acetaminophen (TYLENOL) tablet 650 mg (650 mg Oral Given 05/14/18 1350)  metoCLOPramide (REGLAN) injection 5 mg (5 mg Intramuscular Given 05/14/18 1353)  diphenhydrAMINE (BENADRYL) injection 25 mg (25 mg Intramuscular Given 05/14/18 1351)     Initial Impression / Assessment and Plan / ED Course  I have reviewed the triage vital signs and the nursing notes.  Pertinent labs & imaging results that were available during my care of the patient were reviewed by me and considered in my medical decision making (see chart for details).  MDM Reviewed: previous chart, nursing note and vitals Reviewed previous: labs and ECG Interpretation: labs, ECG and CT scan   Results for orders placed or performed during the hospital encounter of 05/14/18  Basic metabolic panel  Result Value Ref Range   Sodium 139 135 - 145 mmol/L   Potassium 3.6 3.5 - 5.1  mmol/L   Chloride 104 98 - 111 mmol/L   CO2 26 22 - 32 mmol/L   Glucose, Bld 131 (H) 70 - 99 mg/dL   BUN 16 8 - 23 mg/dL   Creatinine, Ser 6.04 0.61 - 1.24 mg/dL   Calcium 9.3 8.9 - 54.0 mg/dL   GFR calc non Af Amer >60 >60 mL/min   GFR calc Af Amer >60 >60 mL/min   Anion gap 9 5 - 15  CBC  Result Value Ref Range   WBC 7.7 4.0 - 10.5 K/uL   RBC 4.83 4.22 - 5.81 MIL/uL   Hemoglobin 14.6 13.0 - 17.0 g/dL   HCT 98.1 19.1 - 47.8 %   MCV 90.3 78.0 - 100.0 fL   MCH 30.2 26.0 - 34.0 pg   MCHC 33.5 30.0 - 36.0 g/dL   RDW 29.5 62.1 - 30.8 %   Platelets 218 150 - 400 K/uL  Urinalysis, Routine w reflex microscopic  Result Value Ref Range   Color, Urine YELLOW YELLOW   APPearance CLEAR CLEAR   Specific Gravity, Urine 1.011 1.005 - 1.030   pH 5.0 5.0 - 8.0   Glucose, UA NEGATIVE NEGATIVE mg/dL   Hgb urine dipstick NEGATIVE NEGATIVE   Bilirubin Urine NEGATIVE NEGATIVE   Ketones, ur NEGATIVE NEGATIVE mg/dL   Protein, ur 30 (A) NEGATIVE mg/dL   Nitrite NEGATIVE NEGATIVE   Leukocytes, UA SMALL (A) NEGATIVE   RBC / HPF 0-5 0 - 5 RBC/hpf   WBC, UA 0-5 0 - 5 WBC/hpf   Bacteria, UA NONE SEEN NONE SEEN   Squamous Epithelial / LPF 0-5 0 - 5   Mucus PRESENT   Troponin I  Result Value Ref Range   Troponin I <0.03 <0.03 ng/mL  CBG monitoring, ED  Result Value Ref Range   Glucose-Capillary 99 70 - 99 mg/dL   Ct Head Wo Contrast Result Date: 05/14/2018 CLINICAL DATA:  Vertigo and hypertension EXAM: CT HEAD WITHOUT CONTRAST TECHNIQUE: Contiguous axial images were obtained from the base of the skull through the vertex without intravenous contrast. COMPARISON:  03/28/2013 FINDINGS: Brain: There is no mass, hemorrhage or extra-axial collection. The size and configuration of the ventricles and extra-axial CSF spaces are normal. There is no acute or chronic infarction. The brain parenchyma is normal. Vascular: No abnormal hyperdensity of the major  intracranial arteries or dural venous sinuses. No  intracranial atherosclerosis. Skull: The visualized skull base, calvarium and extracranial soft tissues are normal. Sinuses/Orbits: No fluid levels or advanced mucosal thickening of the visualized paranasal sinuses. Unchanged left ethmoid retention cyst. No mastoid or middle ear effusion. The orbits are normal. IMPRESSION: Normal aging brain. Electronically Signed   By: Deatra Robinson M.D.   On: 05/14/2018 16:01    1655:  Pt has tol PO well while in the ED without N/V.  Abd remains benign, resps easy, neuro exam remains intact/unchanged, VSS. Pt has ambulated with steady gait, easy resps. States he feels better and wants to go home now.  EKG with previous IVCD, now LBBB. Troponin is negative. Pt continues to deny CP/palpitations/SOB now or at any time recently. Pt does have hx HTN; likely needs echo.  Informed pt and wife; will f/u with PMD vs Cards MD. Pt would like to go home now. Dx and testing d/w pt and family.  Questions answered.  Verb understanding, agreeable to d/c home with outpt f/u.     Final Clinical Impressions(s) / ED Diagnoses   Final diagnoses:  None    ED Discharge Orders    None       [name], DO 05/17/18 1520

## 2018-06-22 DIAGNOSIS — Z125 Encounter for screening for malignant neoplasm of prostate: Secondary | ICD-10-CM | POA: Diagnosis not present

## 2018-06-22 DIAGNOSIS — E119 Type 2 diabetes mellitus without complications: Secondary | ICD-10-CM | POA: Diagnosis not present

## 2018-06-22 DIAGNOSIS — Z0000 Encounter for general adult medical examination without abnormal findings: Secondary | ICD-10-CM | POA: Diagnosis not present

## 2018-06-22 DIAGNOSIS — Z23 Encounter for immunization: Secondary | ICD-10-CM | POA: Diagnosis not present

## 2018-08-24 DIAGNOSIS — Z23 Encounter for immunization: Secondary | ICD-10-CM | POA: Diagnosis not present

## 2018-10-02 DIAGNOSIS — Z125 Encounter for screening for malignant neoplasm of prostate: Secondary | ICD-10-CM | POA: Diagnosis not present

## 2018-10-02 DIAGNOSIS — R7303 Prediabetes: Secondary | ICD-10-CM | POA: Diagnosis not present

## 2018-11-11 DIAGNOSIS — N2581 Secondary hyperparathyroidism of renal origin: Secondary | ICD-10-CM | POA: Diagnosis not present

## 2018-11-11 DIAGNOSIS — E1121 Type 2 diabetes mellitus with diabetic nephropathy: Secondary | ICD-10-CM | POA: Diagnosis not present

## 2018-11-11 DIAGNOSIS — I152 Hypertension secondary to endocrine disorders: Secondary | ICD-10-CM | POA: Diagnosis not present

## 2018-11-16 DIAGNOSIS — I152 Hypertension secondary to endocrine disorders: Secondary | ICD-10-CM | POA: Diagnosis not present

## 2018-11-16 DIAGNOSIS — G4733 Obstructive sleep apnea (adult) (pediatric): Secondary | ICD-10-CM | POA: Diagnosis not present

## 2018-11-24 DIAGNOSIS — L718 Other rosacea: Secondary | ICD-10-CM | POA: Diagnosis not present

## 2018-11-24 DIAGNOSIS — L812 Freckles: Secondary | ICD-10-CM | POA: Diagnosis not present

## 2018-11-24 DIAGNOSIS — L821 Other seborrheic keratosis: Secondary | ICD-10-CM | POA: Diagnosis not present

## 2018-11-26 DIAGNOSIS — I152 Hypertension secondary to endocrine disorders: Secondary | ICD-10-CM | POA: Diagnosis not present

## 2018-12-21 DIAGNOSIS — I10 Essential (primary) hypertension: Secondary | ICD-10-CM | POA: Diagnosis not present

## 2018-12-21 DIAGNOSIS — E119 Type 2 diabetes mellitus without complications: Secondary | ICD-10-CM | POA: Diagnosis not present

## 2020-04-19 ENCOUNTER — Ambulatory Visit: Payer: 59 | Admitting: Orthopaedic Surgery

## 2020-07-11 IMAGING — CT CT HEAD W/O CM
4 series · 16 of 47 positions shown, 18 images · non-contrast
Comparison: 03/28/2013

CLINICAL DATA: Vertigo and hypertension

EXAM:
CT HEAD WITHOUT CONTRAST
TECHNIQUE: Contiguous axial images were obtained from the base of the skull
through the vertex without intravenous contrast.

[Series 3: head without · axial · non-contrast · 0.44mm/px · z∈[-38,+82]mm · 7 of 33 slices shown, 9 images]
[im 5/33  brain]
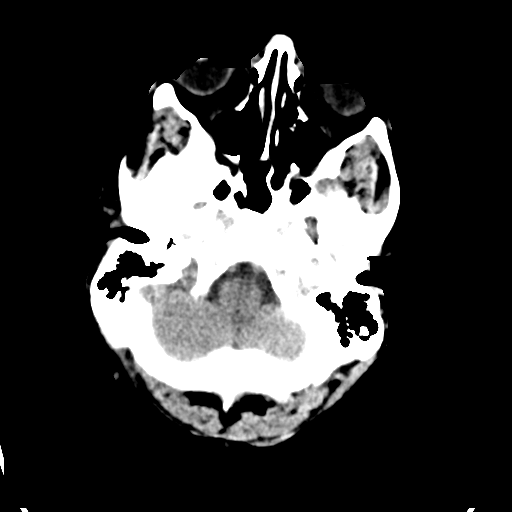
[im 5/33  bone]
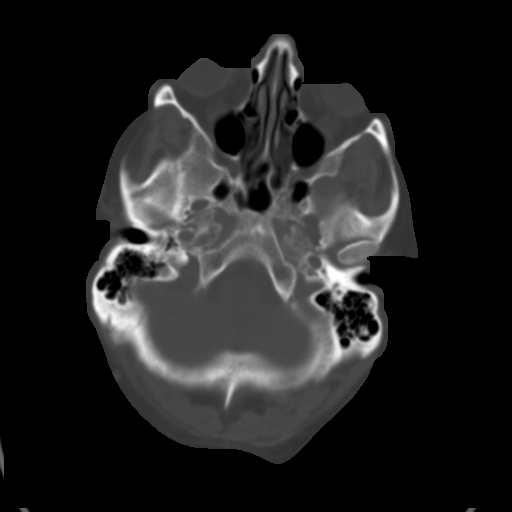
[im 9/33  brain]
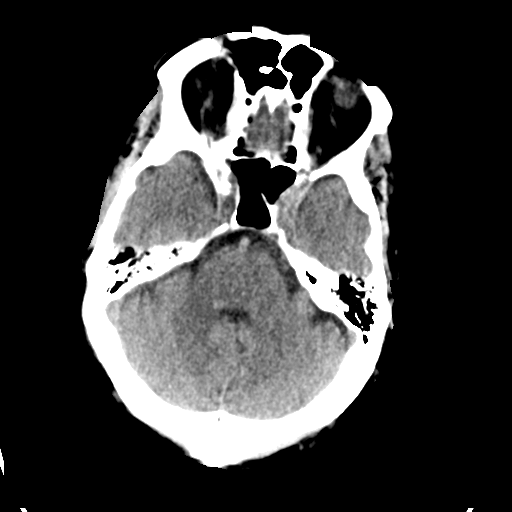
[im 13/33  brain]
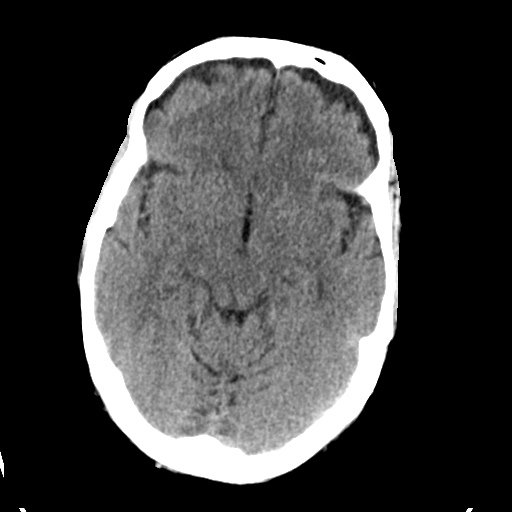
[im 17/33  brain]
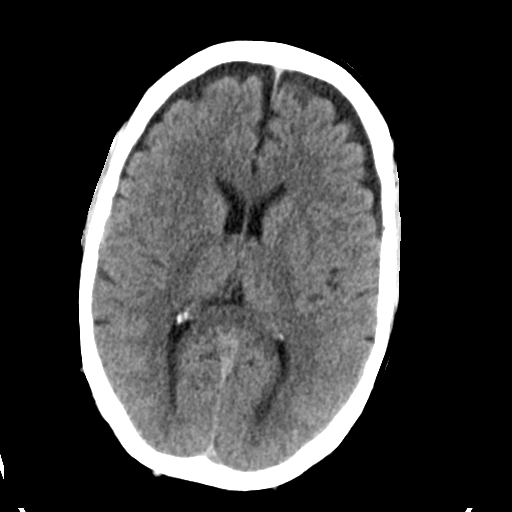
[im 21/33  brain]
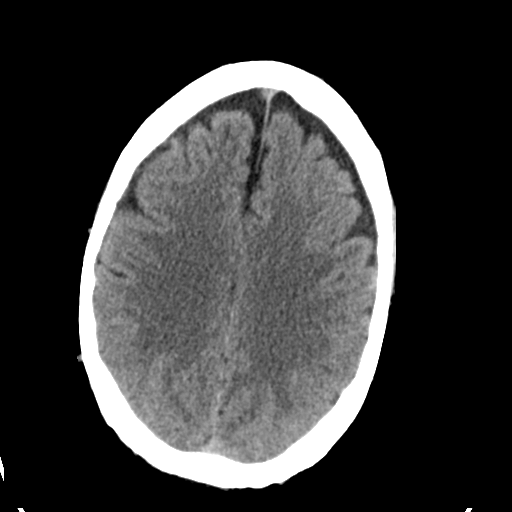
[im 21/33  bone]
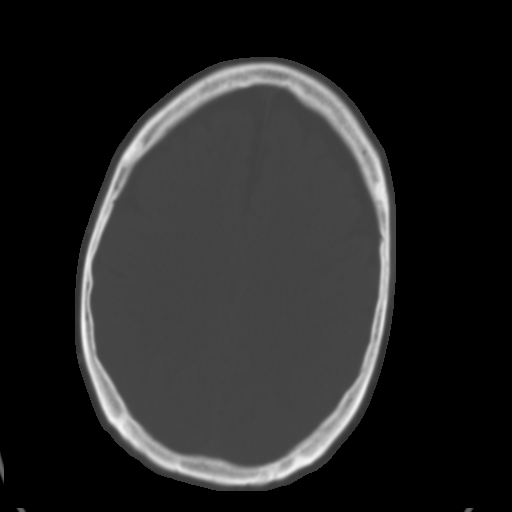
[im 25/33  brain]
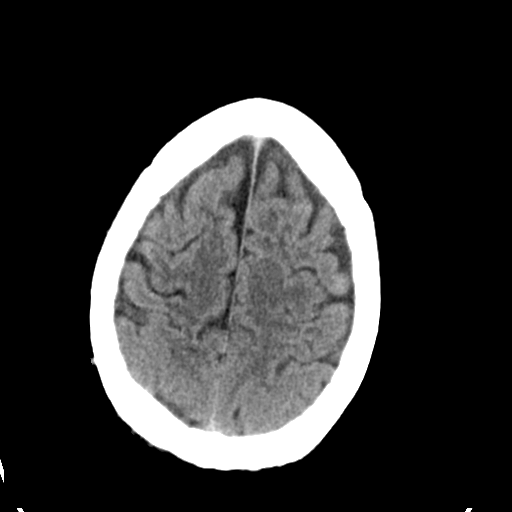
[im 29/33  brain]
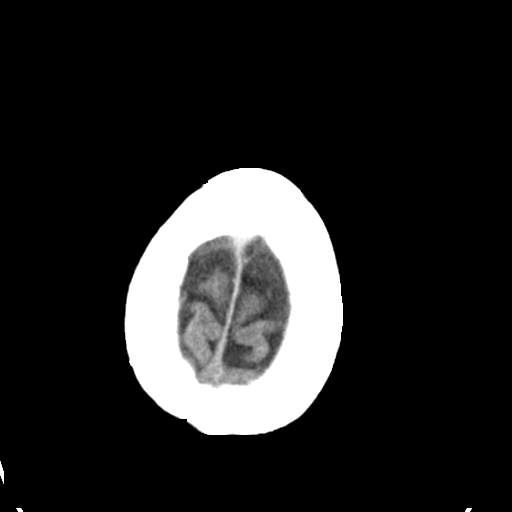

[Series 4: head bone · axial · 0.44mm/px · z∈[-42,-10]mm · 3 of 81 slices shown]
[im 9/81  bone]
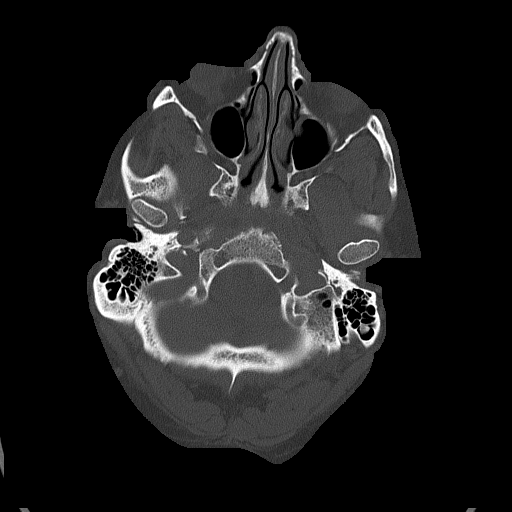
[im 17/81  bone]
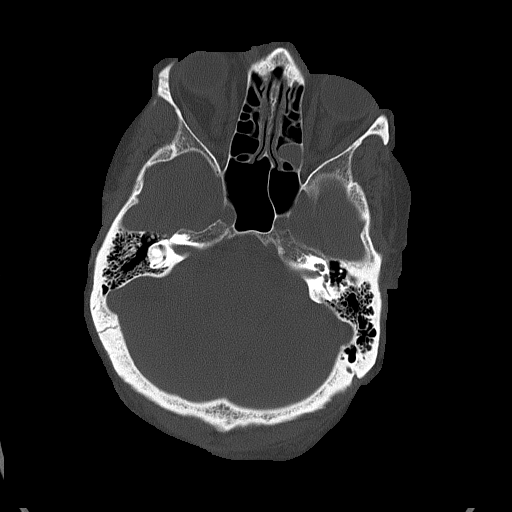
[im 25/81  bone]
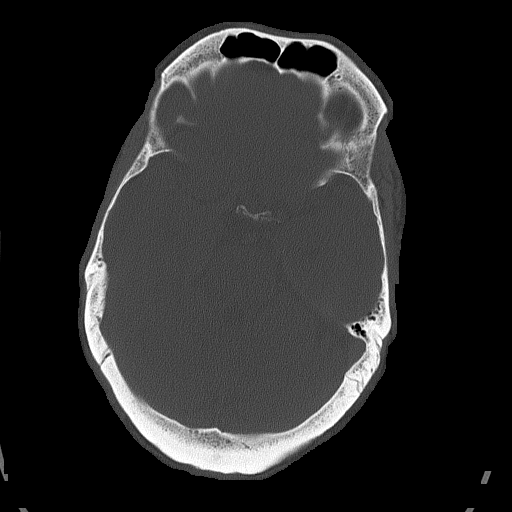

[Series 5: head without cor · coronal · non-contrast · 0.34mm/px · 3 of 70 slices shown]
[im 24/70  brain]
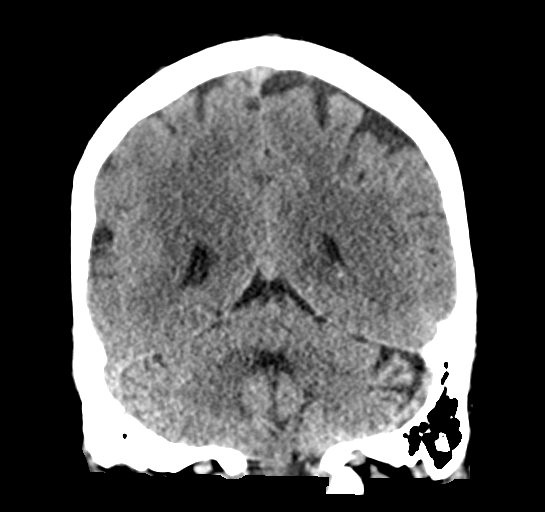
[im 31/70  brain]
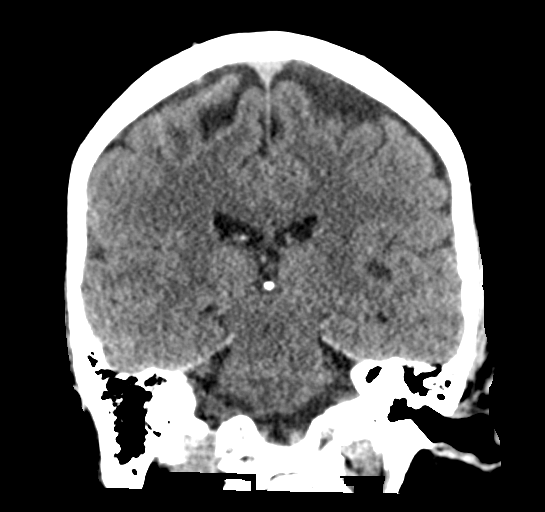
[im 39/70  brain]
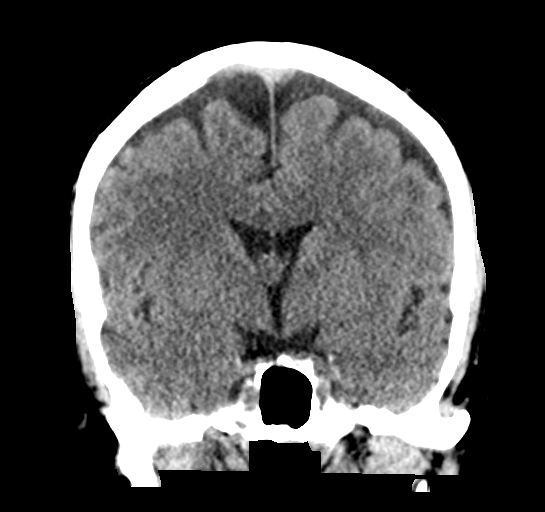

[Series 6: head without sag · sagittal · non-contrast · 0.33mm/px · 3 of 67 slices shown]
[im 23/67  brain]
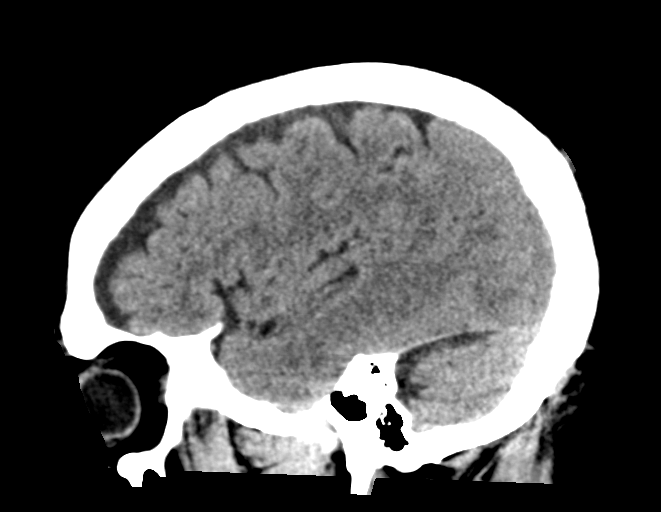
[im 34/67  brain]
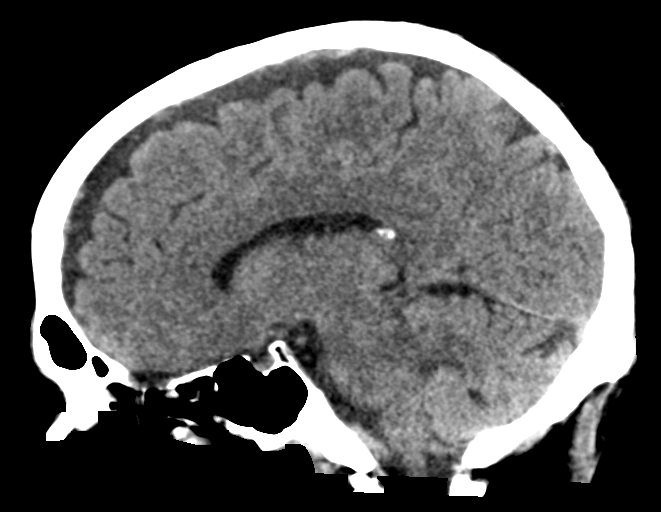
[im 45/67  brain]
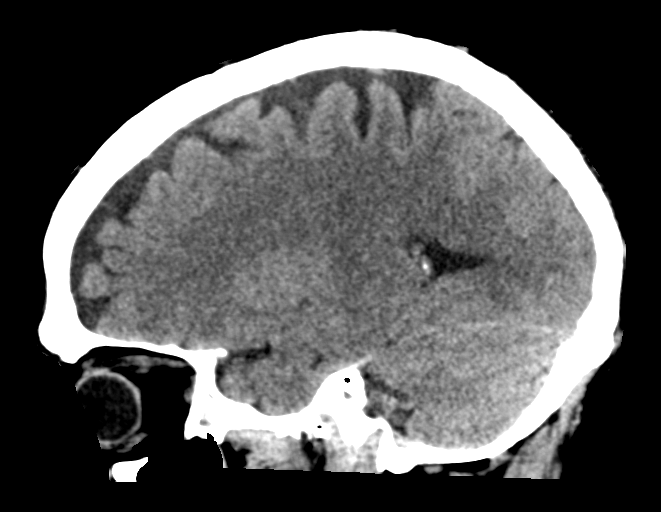

[16 of 47 positions shown; findings below may reference images not displayed]

FINDINGS: Brain: There is no mass, hemorrhage or extra-axial collection. The
size and configuration of the ventricles and extra-axial CSF spaces
are normal. There is no acute or chronic infarction. The brain
parenchyma is normal.

Vascular: No abnormal hyperdensity of the major intracranial
arteries or dural venous sinuses. No intracranial atherosclerosis.

Skull: The visualized skull base, calvarium and extracranial soft
tissues are normal.

Sinuses/Orbits: No fluid levels or advanced mucosal thickening of
the visualized paranasal sinuses. Unchanged left ethmoid retention
cyst. No mastoid or middle ear effusion. The orbits are normal.
IMPRESSION: Normal aging brain.

## 2020-09-06 ENCOUNTER — Other Ambulatory Visit: Payer: Self-pay | Admitting: Adult Health

## 2020-09-06 ENCOUNTER — Other Ambulatory Visit (HOSPITAL_COMMUNITY): Payer: Self-pay

## 2020-09-06 ENCOUNTER — Encounter: Payer: Self-pay | Admitting: Adult Health

## 2020-09-06 DIAGNOSIS — U071 COVID-19: Secondary | ICD-10-CM

## 2020-09-06 NOTE — Progress Notes (Signed)
I connected by phone with [name] on 09/06/2020 at 3:48 PM to discuss the potential use of a new treatment for mild to moderate COVID-19 viral infection in non-hospitalized patients.  This patient is a 64 y.o. male that meets the FDA criteria for Emergency Use Authorization of COVID monoclonal antibody casirivimab/imdevimab, bamlanivimab/eteseviamb, or sotrovimab.  Has a (+) direct SARS-CoV-2 viral test result  Has mild or moderate COVID-19   Is NOT hospitalized due to COVID-19  Is within 10 days of symptom onset  Has at least one of the high risk factor(s) for progression to severe COVID-19 and/or hospitalization as defined in EUA.  Specific high risk criteria : BMI > 25   Sx onset 09/02/2020   I have spoken and communicated the following to the patient or parent/caregiver regarding COVID monoclonal antibody treatment:  1. FDA has authorized the emergency use for the treatment of mild to moderate COVID-19 in adults and pediatric patients with positive results of direct SARS-CoV-2 viral testing who are 86 years of age and older weighing at least 40 kg, and who are at high risk for progressing to severe COVID-19 and/or hospitalization.  2. The significant known and potential risks and benefits of COVID monoclonal antibody, and the extent to which such potential risks and benefits are unknown.  3. Information on available alternative treatments and the risks and benefits of those alternatives, including clinical trials.  4. Patients treated with COVID monoclonal antibody should continue to self-isolate and use infection control measures (e.g., wear mask, isolate, social distance, avoid sharing personal items, clean and disinfect "high touch" surfaces, and frequent handwashing) according to CDC guidelines.   5. The patient or parent/caregiver has the option to accept or refuse COVID monoclonal antibody treatment. 6. Cost reviewed  After reviewing this information with the  patient, the patient has agreed to receive one of the available covid 19 monoclonal antibodies and will be provided an appropriate fact sheet prior to infusion. Noreene Filbert, NP 09/06/2020 3:48 PM

## 2020-09-08 ENCOUNTER — Ambulatory Visit (HOSPITAL_COMMUNITY)
Admission: RE | Admit: 2020-09-08 | Discharge: 2020-09-08 | Disposition: A | Discharge status: Home / Self Care | Payer: 59 | Source: Ambulatory Visit | Attending: Pulmonary Disease | Admitting: Pulmonary Disease

## 2020-09-08 DIAGNOSIS — U071 COVID-19: Secondary | ICD-10-CM

## 2020-09-08 MED ORDER — FAMOTIDINE IN NACL 20-0.9 MG/50ML-% IV SOLN: 20.0000 mg | Freq: Once | INTRAVENOUS | Status: DC | PRN

## 2020-09-08 MED ORDER — EPINEPHRINE 0.3 MG/0.3ML IJ SOAJ: 0.3000 mg | Freq: Once | INTRAMUSCULAR | Status: DC | PRN

## 2020-09-08 MED ORDER — ALBUTEROL SULFATE HFA 108 (90 BASE) MCG/ACT IN AERS: 2.0000 | INHALATION_SPRAY | Freq: Once | RESPIRATORY_TRACT | Status: DC | PRN

## 2020-09-08 MED ORDER — DIPHENHYDRAMINE HCL 50 MG/ML IJ SOLN: 50.0000 mg | Freq: Once | INTRAMUSCULAR | Status: DC | PRN

## 2020-09-08 MED ORDER — METHYLPREDNISOLONE SODIUM SUCC 125 MG IJ SOLR: 125.0000 mg | Freq: Once | INTRAMUSCULAR | Status: DC | PRN

## 2020-09-08 MED ORDER — SODIUM CHLORIDE 0.9 % IV SOLN: INTRAVENOUS | Status: DC | PRN

## 2020-09-08 MED ORDER — SOTROVIMAB 500 MG/8ML IV SOLN
500.0000 mg | Freq: Once | INTRAVENOUS | Status: AC
  Administered 2020-09-08: 500 mg via INTRAVENOUS

## 2020-09-08 NOTE — Progress Notes (Signed)
Patient reviewed Fact Sheet for Patients, Parents, and Caregivers for Emergency Use Authorization (EUA) of Sotrovimab for the Treatment of Coronavirus. Patient also reviewed and is agreeable to the estimated cost of treatment. Patient is agreeable to proceed.   

## 2020-09-08 NOTE — Progress Notes (Signed)
Diagnosis: COVID-19  Physician: Dr. Patrick Wright  Procedure: Covid Infusion Clinic Med: Sotrovimab infusion - Provided patient with sotrovimab fact sheet for patients, parents, and caregivers prior to infusion.   Complications: No immediate complications noted  Discharge: Discharged home    

## 2020-09-08 NOTE — Discharge Instructions (Signed)

## 2021-08-27 ENCOUNTER — Encounter: Payer: Self-pay | Admitting: Physician Assistant

## 2021-08-27 ENCOUNTER — Ambulatory Visit: Payer: 59 | Admitting: Physician Assistant

## 2021-08-27 ENCOUNTER — Ambulatory Visit: Payer: Self-pay

## 2021-08-27 DIAGNOSIS — M1712 Unilateral primary osteoarthritis, left knee: Secondary | ICD-10-CM

## 2021-08-27 DIAGNOSIS — M1711 Unilateral primary osteoarthritis, right knee: Secondary | ICD-10-CM

## 2021-08-27 NOTE — Progress Notes (Signed)
Office Visit Note   Patient: Jonathan Fowler           Date of Birth: 03-Jun-1956           MRN: [id number] Visit Date: 08/27/2021              Requested by: Darrow Bussing, MD [address] [place],  [place] [address] PCP: Darrow Bussing, MD   Assessment & Plan: Visit Diagnoses:  1. Primary osteoarthritis of right knee   2. Primary osteoarthritis of left knee     Plan: Given the fact the patient had recent flulike symptoms and pneumonia would not recommend cortisone injection today.  After the next 1 to 2 weeks would be willing to give him a cortisone injection in his left knee if he wants to.  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  Continue his naproxen.  Also he can begin using Voltaren gel up to 4 g 4 times daily on both knees.  Follow-Up Instructions: Return if symptoms worsen or fail to improve.   Orders:  Orders Placed This Encounter  Procedures   XR Knee 1-2 Views Left   XR Knee 1-2 Views Right   No orders of the defined types were placed in this encounter.     Procedures: No procedures performed   Clinical Data: No additional findings.   Subjective: Chief Complaint  Patient presents with   Left Knee - Pain   Right Knee - Pain    HPI Mr. Bourne returns today for bilateral knee pain.  His last seen in 2019 was given a cortisone injection for his left arthritic knee.  He is having increased worsening pain in the left knee over the last 3 to 4 months.  Feels like is gotten a little bit better since he began taking naproxen.  He has had no new falls or injuries.  He has no significant pain in the right knee.  Review of Systems Denies any fever or chills currently.  However reports flulike symptoms within the last week and pneumonia.   Objective: Vital Signs: There were no vitals taken for this visit.  Physical Exam General: Well-developed well-nourished male in no acute distress. Psych: Alert and oriented x3 Ortho Exam Left knee  good range of motion left knee nontender along medial lateral joint line.  No instability valgus varus stressing.  No abnormal warmth erythema or effusion left knee.  Slight patellofemoral crepitus. Specialty Comments:  No specialty comments available.  Imaging: XR Knee 1-2 Views Left  Result Date: 08/27/2021 Left knee 3 views: Bone-on-bone medial compartment with moderate to moderately severe patellofemoral arthritic changes.  Mild lateral compartmental changes with periarticular spurring off the lateral compartment.  Knee is well located.  No acute fractures.  XR Knee 1-2 Views Right  Result Date: 08/27/2021 Right knee: 2 view shows mild to moderate patellofemoral and medial compartmental narrowing.  No acute fractures.  Knee is well located.    PMFS History: Patient Active Problem List   Diagnosis Date Noted   Olecranon bursitis of left elbow 08/25/2017   Past Medical History:  Diagnosis Date   High cholesterol    Hypertension    Vertigo     History reviewed. No pertinent family history.  History reviewed. No pertinent surgical history. Social History   Occupational History   Not on file  Tobacco Use   Smoking status: Never   Smokeless tobacco: Never  Substance and Sexual Activity   Alcohol use: Yes  Comment: socially   Drug use: No   Sexual activity: Not on file

## 2021-09-10 ENCOUNTER — Ambulatory Visit: Payer: 59 | Admitting: Physician Assistant

## 2022-06-13 ENCOUNTER — Emergency Department (HOSPITAL_COMMUNITY)
Admission: EM | Admit: 2022-06-13 | Discharge: 2022-06-14 | Disposition: A | Discharge status: Home / Self Care | Payer: 59 | Attending: Emergency Medicine | Admitting: Emergency Medicine

## 2022-06-13 ENCOUNTER — Other Ambulatory Visit: Payer: Self-pay

## 2022-06-13 ENCOUNTER — Encounter (HOSPITAL_COMMUNITY): Payer: Self-pay | Admitting: Emergency Medicine

## 2022-06-13 DIAGNOSIS — I10 Essential (primary) hypertension: Secondary | ICD-10-CM | POA: Diagnosis not present

## 2022-06-13 DIAGNOSIS — R339 Retention of urine, unspecified: Secondary | ICD-10-CM | POA: Insufficient documentation

## 2022-06-13 DIAGNOSIS — Z7982 Long term (current) use of aspirin: Secondary | ICD-10-CM | POA: Diagnosis not present

## 2022-06-13 DIAGNOSIS — Z79899 Other long term (current) drug therapy: Secondary | ICD-10-CM | POA: Diagnosis not present

## 2022-06-13 LAB — URINALYSIS, ROUTINE W REFLEX MICROSCOPIC
Bilirubin Urine: NEGATIVE
Glucose, UA: NEGATIVE mg/dL
Ketones, ur: NEGATIVE mg/dL
Leukocytes,Ua: NEGATIVE
Nitrite: NEGATIVE
Protein, ur: 100 mg/dL — AB
Specific Gravity, Urine: 1.012 (ref 1.005–1.030)
pH: 5 (ref 5.0–8.0)

## 2022-06-13 NOTE — Discharge Instructions (Signed)
Follow up with urology-- I would call the office in the morning and let them know foley had to be put back in.  They will let you know the next steps.

## 2022-06-13 NOTE — ED Triage Notes (Signed)
Patient reports bladder pressure/urinary retention today after removing in-dwelling urinary catheter today at urologist clinic .

## 2022-06-13 NOTE — ED Provider Notes (Signed)
[facility] EMERGENCY DEPARTMENT Provider Note   CSN: [id number] Arrival date & time: 06/13/22  2143     History  Chief Complaint  Patient presents with   Urinary Retention     Jonathan Fowler is a Jonathan y.o. male.  The history is provided by the patient and medical records.   Jonathan y.o. M with hx of HTN, HLP, presenting to the ED with urinary retention.  States he was seen at ER in [place] over the weekend for same, had catheter placed and started on flomax.  Was seen in urology clinic today here locally for follow-up, had catheter removed around 11am.  Some dribbling of urine since that time but no real urinary stream.  States he is "in agony" right now.  Denies fever, chills, sweats, nausea, vomiting.  He denies known hx of BPH or other prostate issues.  Home Medications Prior to Admission medications   Medication Sig Start Date End Date Taking? Authorizing Provider  aspirin 81 MG tablet Take 81 mg by mouth every evening.     [provider]  atorvastatin (LIPITOR) 10 MG tablet Take 10 mg by mouth daily. 05/07/18   [provider]  eplerenone (INSPRA) 50 MG tablet Take 50 mg by mouth 2 (two) times daily. 05/07/18   [provider]  glucosamine-chondroitin 500-400 MG tablet Take 1 tablet by mouth 3 (three) times daily.    [provider]  hydrALAZINE (APRESOLINE) 50 MG tablet Take 50 mg by mouth 3 (three) times daily.    [provider]  irbesartan (AVAPRO) 300 MG tablet Take 300 mg by mouth daily.    [provider]  meclizine (ANTIVERT) 25 MG tablet Take 1 tablet (25 mg total) by mouth 3 (three) times daily as needed for dizziness. 05/14/18   [name], DO  Multiple Vitamins-Minerals (CENTRUM SILVER 50+MEN) TABS Take 1 tablet by mouth daily.    [provider]  ondansetron (ZOFRAN ODT) 4 MG disintegrating tablet Take 1 tablet (4 mg total) by mouth every 8 (eight) hours as needed for nausea or vomiting.  05/14/18   [name], DO      Allergies    Penicillins    Review of Systems   Review of Systems  Genitourinary:  Positive for difficulty urinating.  All other systems reviewed and are negative.   Physical Exam Updated Vital Signs BP (!) 176/82   Pulse 79   Temp 98.3 F (36.8 C) (Oral)   Resp (!) 22   SpO2 94%   Physical Exam Vitals and nursing note reviewed.  Constitutional:      Appearance: He is well-developed.     Comments: Uncomfortable appearing  HENT:     Head: Normocephalic and atraumatic.  Eyes:     Conjunctiva/sclera: Conjunctivae normal.     Pupils: Pupils are equal, round, and reactive to light.  Cardiovascular:     Rate and Rhythm: Normal rate and regular rhythm.     Heart sounds: Normal heart sounds.  Pulmonary:     Effort: Pulmonary effort is normal.     Breath sounds: Normal breath sounds.  Abdominal:     General: Bowel sounds are normal.     Palpations: Abdomen is soft.     Comments: Fullness over bladder  Musculoskeletal:        General: Normal range of motion.     Cervical back: Normal range of motion.  Skin:    General: Skin is warm and dry.  Neurological:  Mental Status: He is alert and oriented to person, place, and time.     ED Results / Procedures / Treatments   Labs (all labs ordered are listed, but only abnormal results are displayed) Labs Reviewed  URINALYSIS, ROUTINE W REFLEX MICROSCOPIC - Abnormal; Notable for the following components:      Result Value   Hgb urine dipstick SMALL (*)    Protein, ur 100 (*)    Bacteria, UA RARE (*)    All other components within normal limits  URINE CULTURE    EKG None  Radiology No results found.  Procedures Procedures    Medications Ordered in ED Medications - No data to display  ED Course/ Medical Decision Making/ A&P                           Medical Decision Making Amount and/or Complexity of Data Reviewed Labs: ordered.   Jonathan y.o. M here with urinary  retention.  Seen for same in [place] over the weekend and had foley placed which was removed today at 11am in urology office.  No meaningful urine output since that time, some small dribbles.  Patient appears uncomfortable, does have fullness over the bladder on exam.  Foley re-inserted, drained out >1100cc urine.  Patient much more comfortable at this time.  UA without signs of infection (rare bacteria), culture has been sent.  Will be d/c to follow-up with urology once again-- I have asked Jonathan Fowler to call in the morning and advise them that foley had to be re-inserted.  Can return here for new concerns.  Final Clinical Impression(s) / ED Diagnoses Final diagnoses:  Urinary retention    Rx / DC Orders ED Discharge Orders     None         Garlon Hatchet, PA-C 06/13/22 2352    Gerhard Munch, MD 06/14/22 0008

## 2022-06-15 LAB — URINE CULTURE: Culture: NO GROWTH

## 2022-10-18 DIAGNOSIS — C61 Malignant neoplasm of prostate: Secondary | ICD-10-CM | POA: Diagnosis not present

## 2022-10-18 DIAGNOSIS — N401 Enlarged prostate with lower urinary tract symptoms: Secondary | ICD-10-CM | POA: Diagnosis not present

## 2022-10-18 DIAGNOSIS — R338 Other retention of urine: Secondary | ICD-10-CM | POA: Diagnosis not present

## 2022-11-06 DIAGNOSIS — R338 Other retention of urine: Secondary | ICD-10-CM | POA: Diagnosis not present

## 2022-11-06 DIAGNOSIS — N401 Enlarged prostate with lower urinary tract symptoms: Secondary | ICD-10-CM | POA: Diagnosis not present

## 2022-12-10 DIAGNOSIS — B961 Klebsiella pneumoniae [K. pneumoniae] as the cause of diseases classified elsewhere: Secondary | ICD-10-CM | POA: Diagnosis not present

## 2022-12-10 DIAGNOSIS — N390 Urinary tract infection, site not specified: Secondary | ICD-10-CM | POA: Diagnosis not present

## 2022-12-10 DIAGNOSIS — R338 Other retention of urine: Secondary | ICD-10-CM | POA: Diagnosis not present

## 2022-12-16 DIAGNOSIS — I10 Essential (primary) hypertension: Secondary | ICD-10-CM | POA: Diagnosis not present

## 2022-12-16 DIAGNOSIS — Z0000 Encounter for general adult medical examination without abnormal findings: Secondary | ICD-10-CM | POA: Diagnosis not present

## 2022-12-16 DIAGNOSIS — R7309 Other abnormal glucose: Secondary | ICD-10-CM | POA: Diagnosis not present

## 2022-12-16 DIAGNOSIS — E269 Hyperaldosteronism, unspecified: Secondary | ICD-10-CM | POA: Diagnosis not present

## 2022-12-16 DIAGNOSIS — N138 Other obstructive and reflux uropathy: Secondary | ICD-10-CM | POA: Diagnosis not present

## 2022-12-16 DIAGNOSIS — E876 Hypokalemia: Secondary | ICD-10-CM | POA: Diagnosis not present

## 2022-12-24 ENCOUNTER — Ambulatory Visit (INDEPENDENT_AMBULATORY_CARE_PROVIDER_SITE_OTHER): Payer: BLUE CROSS/BLUE SHIELD | Admitting: Physician Assistant

## 2022-12-24 ENCOUNTER — Encounter: Payer: Self-pay | Admitting: Physician Assistant

## 2022-12-24 VITALS — Ht 71.5 in | Wt 248.6 lb

## 2022-12-24 DIAGNOSIS — M1712 Unilateral primary osteoarthritis, left knee: Secondary | ICD-10-CM | POA: Diagnosis not present

## 2022-12-24 DIAGNOSIS — M1711 Unilateral primary osteoarthritis, right knee: Secondary | ICD-10-CM

## 2022-12-24 DIAGNOSIS — M170 Bilateral primary osteoarthritis of knee: Secondary | ICD-10-CM | POA: Diagnosis not present

## 2022-12-24 MED ORDER — LIDOCAINE HCL 1 % IJ SOLN
3.0000 mL | INTRAMUSCULAR | Status: AC | PRN
  Administered 2022-12-24: 3 mL

## 2022-12-24 MED ORDER — METHYLPREDNISOLONE ACETATE 40 MG/ML IJ SUSP
40.0000 mg | INTRAMUSCULAR | Status: AC | PRN
  Administered 2022-12-24: 40 mg via INTRA_ARTICULAR

## 2022-12-24 NOTE — Progress Notes (Signed)
HPI: Jonathan Fowler comes in today for bilateral knee pain left greater than right.  He was last seen November 2022.  Since that time he has had prostate surgery in January states recovered from that and is doing well.  He has bilateral knee pain that he tolerates with the use of Voltaren gel and over-the-counter oral NSAIDs.  He is thinking about possibly having surgery at some point in time but it is not sure that he is ready for surgery at this point in time.  He has had no injuries to either knee.  Prior radiographs of his knees show bone-on-bone medial compartment left knee with moderate to moderately severe patellofemoral arthritic changes.  Mild lateral compartmental changes with periarticular spurring.  The right knee moderate patellofemoral and medial compartmental narrowing.  He has had cortisone injections in the past.  Negative for fevers.  Chills.  He is nondiabetic.  Review of systems: See HPI otherwise negative or noncontributory.  Physical exam: General well-developed well-nourished male no acute distress mood and affect appropriate. Bilateral knees: No abnormal warmth erythema or effusion.  Good range of motion both knees.  Both knees with patellofemoral crepitus.  No instability valgus varus stressing of either knee.  Impression: Bilateral knee osteoarthritis  Plan: After talking with him for a long period of time he is really not interested in knee replacement at this point in time it is not to the point that it is affecting his quality of life.  He would like to try cortisone injection in left knee today.  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  Knee friendly exercises discussed.  Will see him back in 2 weeks to see what type of relief he got with the knee injection on the left.  Questions were encouraged and answered at length.      Procedure Note  Patient: Jonathan Fowler             Date of Birth: 1955-12-19           MRN: [id number]             Visit Date:  12/24/2022  Procedures: Visit Diagnoses:  1. Primary osteoarthritis of left knee   2. Primary osteoarthritis of right knee     Large Joint Inj: L knee on 12/24/2022 6:58 PM Indications: pain Details: 22 G 1.5 in needle, anterolateral approach  Arthrogram: No  Medications: 3 mL lidocaine 1 %; 40 mg methylPREDNISolone acetate 40 MG/ML Outcome: tolerated well, no immediate complications Procedure, treatment alternatives, risks and benefits explained, specific risks discussed. Consent was given by the patient. Immediately prior to procedure a time out was called to verify the correct patient, procedure, equipment, support staff and site/side marked as required. Patient was prepped and draped in the usual sterile fashion.

## 2022-12-30 DIAGNOSIS — E876 Hypokalemia: Secondary | ICD-10-CM | POA: Diagnosis not present

## 2023-01-08 ENCOUNTER — Ambulatory Visit: Payer: BC Managed Care – PPO | Admitting: Orthopaedic Surgery

## 2023-01-08 ENCOUNTER — Encounter: Payer: Self-pay | Admitting: Orthopaedic Surgery

## 2023-01-08 DIAGNOSIS — M1712 Unilateral primary osteoarthritis, left knee: Secondary | ICD-10-CM | POA: Diagnosis not present

## 2023-01-08 DIAGNOSIS — G8929 Other chronic pain: Secondary | ICD-10-CM

## 2023-01-08 DIAGNOSIS — M25562 Pain in left knee: Secondary | ICD-10-CM

## 2023-01-08 NOTE — Progress Notes (Signed)
The patient has known and well-documented osteoarthritis of both his knees with the left much worse than the right.  He is 67 years old and he has a history of both his knees having arthroscopic surgery with meniscal debridements and partial meniscectomies.  He came in 2 weeks ago due to acute flareup of left knee pain and was considering potentially proceeding with knee replacement.  However he had a steroid injection in his knee for the first time in a very long time and that is helped him greatly he has no concerns right now.  He is a young and active 67 year old.  He has had prostate surgery in January which helped greatly.  On exam both knees have slight varus malalignment but excellent range of motion overall.  We talked about the possibility of hyaluronic acid in the future and I gave him a handout about this.  Right now we will stick with conservative treatment since his symptoms are minimal and he is not having any significant issues.  If his knees do worsen he knows to let us know.  All questions concerns were answered and addressed.

## 2023-01-21 DIAGNOSIS — E876 Hypokalemia: Secondary | ICD-10-CM | POA: Diagnosis not present

## 2023-02-05 DIAGNOSIS — E559 Vitamin D deficiency, unspecified: Secondary | ICD-10-CM | POA: Diagnosis not present

## 2023-02-05 DIAGNOSIS — R809 Proteinuria, unspecified: Secondary | ICD-10-CM | POA: Diagnosis not present

## 2023-02-18 ENCOUNTER — Other Ambulatory Visit: Payer: Self-pay | Admitting: Orthopaedic Surgery

## 2023-02-18 ENCOUNTER — Ambulatory Visit
Admission: RE | Admit: 2023-02-18 | Discharge: 2023-02-18 | Disposition: A | Discharge status: Home / Self Care | Payer: BC Managed Care – PPO | Source: Ambulatory Visit | Attending: Orthopaedic Surgery | Admitting: Orthopaedic Surgery

## 2023-02-18 DIAGNOSIS — W19XXXA Unspecified fall, initial encounter: Secondary | ICD-10-CM | POA: Diagnosis not present

## 2023-02-18 DIAGNOSIS — Z01818 Encounter for other preprocedural examination: Secondary | ICD-10-CM

## 2023-02-18 DIAGNOSIS — S46011A Strain of muscle(s) and tendon(s) of the rotator cuff of right shoulder, initial encounter: Secondary | ICD-10-CM | POA: Diagnosis not present

## 2023-02-18 DIAGNOSIS — M25511 Pain in right shoulder: Secondary | ICD-10-CM | POA: Diagnosis not present

## 2023-02-24 ENCOUNTER — Other Ambulatory Visit: Payer: Self-pay | Admitting: Urology

## 2023-02-24 DIAGNOSIS — C61 Malignant neoplasm of prostate: Secondary | ICD-10-CM

## 2023-02-26 DIAGNOSIS — E269 Hyperaldosteronism, unspecified: Secondary | ICD-10-CM | POA: Diagnosis not present

## 2023-02-26 DIAGNOSIS — R7301 Impaired fasting glucose: Secondary | ICD-10-CM | POA: Diagnosis not present

## 2023-02-26 DIAGNOSIS — E876 Hypokalemia: Secondary | ICD-10-CM | POA: Diagnosis not present

## 2023-02-26 DIAGNOSIS — Z01818 Encounter for other preprocedural examination: Secondary | ICD-10-CM | POA: Diagnosis not present

## 2023-02-26 DIAGNOSIS — I10 Essential (primary) hypertension: Secondary | ICD-10-CM | POA: Diagnosis not present

## 2023-03-25 ENCOUNTER — Encounter: Payer: Self-pay | Admitting: Urology

## 2023-03-26 ENCOUNTER — Ambulatory Visit
Admission: RE | Admit: 2023-03-26 | Discharge: 2023-03-26 | Disposition: A | Discharge status: Home / Self Care | Payer: BC Managed Care – PPO | Source: Ambulatory Visit | Attending: Urology | Admitting: Urology

## 2023-03-26 DIAGNOSIS — C61 Malignant neoplasm of prostate: Secondary | ICD-10-CM | POA: Diagnosis not present

## 2023-03-26 MED ORDER — GADOPICLENOL 0.5 MMOL/ML IV SOLN
10.0000 mL | Freq: Once | INTRAVENOUS | Status: AC | PRN
  Administered 2023-03-26: 10 mL via INTRAVENOUS

## 2023-04-01 DIAGNOSIS — N400 Enlarged prostate without lower urinary tract symptoms: Secondary | ICD-10-CM | POA: Diagnosis not present

## 2023-04-08 DIAGNOSIS — C61 Malignant neoplasm of prostate: Secondary | ICD-10-CM | POA: Diagnosis not present

## 2023-04-08 DIAGNOSIS — N400 Enlarged prostate without lower urinary tract symptoms: Secondary | ICD-10-CM | POA: Diagnosis not present

## 2023-04-09 ENCOUNTER — Other Ambulatory Visit: Payer: BC Managed Care – PPO

## 2023-04-16 DIAGNOSIS — M659 Synovitis and tenosynovitis, unspecified: Secondary | ICD-10-CM | POA: Diagnosis not present

## 2023-04-16 DIAGNOSIS — M19011 Primary osteoarthritis, right shoulder: Secondary | ICD-10-CM | POA: Diagnosis not present

## 2023-04-16 DIAGNOSIS — G8918 Other acute postprocedural pain: Secondary | ICD-10-CM | POA: Diagnosis not present

## 2023-04-16 DIAGNOSIS — M75121 Complete rotator cuff tear or rupture of right shoulder, not specified as traumatic: Secondary | ICD-10-CM | POA: Diagnosis not present

## 2023-04-16 DIAGNOSIS — M25711 Osteophyte, right shoulder: Secondary | ICD-10-CM | POA: Diagnosis not present

## 2023-04-18 DIAGNOSIS — M19011 Primary osteoarthritis, right shoulder: Secondary | ICD-10-CM | POA: Diagnosis not present

## 2023-04-18 DIAGNOSIS — M6281 Muscle weakness (generalized): Secondary | ICD-10-CM | POA: Diagnosis not present

## 2023-04-18 DIAGNOSIS — M25611 Stiffness of right shoulder, not elsewhere classified: Secondary | ICD-10-CM | POA: Diagnosis not present

## 2023-04-22 DIAGNOSIS — M19011 Primary osteoarthritis, right shoulder: Secondary | ICD-10-CM | POA: Diagnosis not present

## 2023-04-22 DIAGNOSIS — M25611 Stiffness of right shoulder, not elsewhere classified: Secondary | ICD-10-CM | POA: Diagnosis not present

## 2023-04-22 DIAGNOSIS — M6281 Muscle weakness (generalized): Secondary | ICD-10-CM | POA: Diagnosis not present

## 2023-04-24 DIAGNOSIS — M25611 Stiffness of right shoulder, not elsewhere classified: Secondary | ICD-10-CM | POA: Diagnosis not present

## 2023-04-24 DIAGNOSIS — M6281 Muscle weakness (generalized): Secondary | ICD-10-CM | POA: Diagnosis not present

## 2023-04-24 DIAGNOSIS — M19011 Primary osteoarthritis, right shoulder: Secondary | ICD-10-CM | POA: Diagnosis not present

## 2023-04-29 DIAGNOSIS — M19011 Primary osteoarthritis, right shoulder: Secondary | ICD-10-CM | POA: Diagnosis not present

## 2023-04-29 DIAGNOSIS — M25611 Stiffness of right shoulder, not elsewhere classified: Secondary | ICD-10-CM | POA: Diagnosis not present

## 2023-04-29 DIAGNOSIS — M6281 Muscle weakness (generalized): Secondary | ICD-10-CM | POA: Diagnosis not present

## 2023-05-05 DIAGNOSIS — M19011 Primary osteoarthritis, right shoulder: Secondary | ICD-10-CM | POA: Diagnosis not present

## 2023-05-05 DIAGNOSIS — M6281 Muscle weakness (generalized): Secondary | ICD-10-CM | POA: Diagnosis not present

## 2023-05-05 DIAGNOSIS — M25611 Stiffness of right shoulder, not elsewhere classified: Secondary | ICD-10-CM | POA: Diagnosis not present

## 2023-05-16 DIAGNOSIS — M19011 Primary osteoarthritis, right shoulder: Secondary | ICD-10-CM | POA: Diagnosis not present

## 2023-05-16 DIAGNOSIS — M6281 Muscle weakness (generalized): Secondary | ICD-10-CM | POA: Diagnosis not present

## 2023-05-16 DIAGNOSIS — M25611 Stiffness of right shoulder, not elsewhere classified: Secondary | ICD-10-CM | POA: Diagnosis not present

## 2023-05-20 DIAGNOSIS — M6281 Muscle weakness (generalized): Secondary | ICD-10-CM | POA: Diagnosis not present

## 2023-05-20 DIAGNOSIS — M19011 Primary osteoarthritis, right shoulder: Secondary | ICD-10-CM | POA: Diagnosis not present

## 2023-05-20 DIAGNOSIS — M25611 Stiffness of right shoulder, not elsewhere classified: Secondary | ICD-10-CM | POA: Diagnosis not present

## 2023-05-23 DIAGNOSIS — M19011 Primary osteoarthritis, right shoulder: Secondary | ICD-10-CM | POA: Diagnosis not present

## 2023-05-23 DIAGNOSIS — M25611 Stiffness of right shoulder, not elsewhere classified: Secondary | ICD-10-CM | POA: Diagnosis not present

## 2023-05-23 DIAGNOSIS — M6281 Muscle weakness (generalized): Secondary | ICD-10-CM | POA: Diagnosis not present

## 2023-05-27 DIAGNOSIS — M25611 Stiffness of right shoulder, not elsewhere classified: Secondary | ICD-10-CM | POA: Diagnosis not present

## 2023-05-27 DIAGNOSIS — M19011 Primary osteoarthritis, right shoulder: Secondary | ICD-10-CM | POA: Diagnosis not present

## 2023-05-27 DIAGNOSIS — M6281 Muscle weakness (generalized): Secondary | ICD-10-CM | POA: Diagnosis not present

## 2023-05-30 DIAGNOSIS — M19011 Primary osteoarthritis, right shoulder: Secondary | ICD-10-CM | POA: Diagnosis not present

## 2023-05-30 DIAGNOSIS — M25611 Stiffness of right shoulder, not elsewhere classified: Secondary | ICD-10-CM | POA: Diagnosis not present

## 2023-05-30 DIAGNOSIS — M6281 Muscle weakness (generalized): Secondary | ICD-10-CM | POA: Diagnosis not present

## 2023-06-02 DIAGNOSIS — M25611 Stiffness of right shoulder, not elsewhere classified: Secondary | ICD-10-CM | POA: Diagnosis not present

## 2023-06-02 DIAGNOSIS — M19011 Primary osteoarthritis, right shoulder: Secondary | ICD-10-CM | POA: Diagnosis not present

## 2023-06-02 DIAGNOSIS — M6281 Muscle weakness (generalized): Secondary | ICD-10-CM | POA: Diagnosis not present

## 2023-06-06 DIAGNOSIS — M6281 Muscle weakness (generalized): Secondary | ICD-10-CM | POA: Diagnosis not present

## 2023-06-06 DIAGNOSIS — M25611 Stiffness of right shoulder, not elsewhere classified: Secondary | ICD-10-CM | POA: Diagnosis not present

## 2023-06-06 DIAGNOSIS — M19011 Primary osteoarthritis, right shoulder: Secondary | ICD-10-CM | POA: Diagnosis not present

## 2023-06-10 DIAGNOSIS — M19011 Primary osteoarthritis, right shoulder: Secondary | ICD-10-CM | POA: Diagnosis not present

## 2023-06-13 DIAGNOSIS — M6281 Muscle weakness (generalized): Secondary | ICD-10-CM | POA: Diagnosis not present

## 2023-06-13 DIAGNOSIS — M19011 Primary osteoarthritis, right shoulder: Secondary | ICD-10-CM | POA: Diagnosis not present

## 2023-06-13 DIAGNOSIS — M25611 Stiffness of right shoulder, not elsewhere classified: Secondary | ICD-10-CM | POA: Diagnosis not present

## 2023-06-18 DIAGNOSIS — M25611 Stiffness of right shoulder, not elsewhere classified: Secondary | ICD-10-CM | POA: Diagnosis not present

## 2023-06-18 DIAGNOSIS — M6281 Muscle weakness (generalized): Secondary | ICD-10-CM | POA: Diagnosis not present

## 2023-06-18 DIAGNOSIS — M19011 Primary osteoarthritis, right shoulder: Secondary | ICD-10-CM | POA: Diagnosis not present

## 2023-06-24 DIAGNOSIS — M6281 Muscle weakness (generalized): Secondary | ICD-10-CM | POA: Diagnosis not present

## 2023-06-24 DIAGNOSIS — M19011 Primary osteoarthritis, right shoulder: Secondary | ICD-10-CM | POA: Diagnosis not present

## 2023-06-24 DIAGNOSIS — M25611 Stiffness of right shoulder, not elsewhere classified: Secondary | ICD-10-CM | POA: Diagnosis not present

## 2023-06-30 DIAGNOSIS — M25611 Stiffness of right shoulder, not elsewhere classified: Secondary | ICD-10-CM | POA: Diagnosis not present

## 2023-06-30 DIAGNOSIS — M6281 Muscle weakness (generalized): Secondary | ICD-10-CM | POA: Diagnosis not present

## 2023-06-30 DIAGNOSIS — M19011 Primary osteoarthritis, right shoulder: Secondary | ICD-10-CM | POA: Diagnosis not present

## 2023-07-07 DIAGNOSIS — M19011 Primary osteoarthritis, right shoulder: Secondary | ICD-10-CM | POA: Diagnosis not present

## 2023-07-07 DIAGNOSIS — M6281 Muscle weakness (generalized): Secondary | ICD-10-CM | POA: Diagnosis not present

## 2023-07-07 DIAGNOSIS — M25611 Stiffness of right shoulder, not elsewhere classified: Secondary | ICD-10-CM | POA: Diagnosis not present

## 2023-07-11 DIAGNOSIS — M19011 Primary osteoarthritis, right shoulder: Secondary | ICD-10-CM | POA: Diagnosis not present

## 2023-07-14 DIAGNOSIS — R7303 Prediabetes: Secondary | ICD-10-CM | POA: Diagnosis not present

## 2023-07-14 DIAGNOSIS — E876 Hypokalemia: Secondary | ICD-10-CM | POA: Diagnosis not present

## 2023-07-14 DIAGNOSIS — Z23 Encounter for immunization: Secondary | ICD-10-CM | POA: Diagnosis not present

## 2023-07-14 DIAGNOSIS — E269 Hyperaldosteronism, unspecified: Secondary | ICD-10-CM | POA: Diagnosis not present

## 2023-07-14 DIAGNOSIS — I10 Essential (primary) hypertension: Secondary | ICD-10-CM | POA: Diagnosis not present

## 2023-07-18 DIAGNOSIS — M25611 Stiffness of right shoulder, not elsewhere classified: Secondary | ICD-10-CM | POA: Diagnosis not present

## 2023-07-18 DIAGNOSIS — M19011 Primary osteoarthritis, right shoulder: Secondary | ICD-10-CM | POA: Diagnosis not present

## 2023-07-18 DIAGNOSIS — M6281 Muscle weakness (generalized): Secondary | ICD-10-CM | POA: Diagnosis not present

## 2023-07-21 ENCOUNTER — Encounter: Payer: Self-pay | Admitting: Podiatry

## 2023-07-21 ENCOUNTER — Ambulatory Visit: Payer: BC Managed Care – PPO | Admitting: Podiatry

## 2023-07-21 DIAGNOSIS — S90221A Contusion of right lesser toe(s) with damage to nail, initial encounter: Secondary | ICD-10-CM

## 2023-07-21 DIAGNOSIS — B351 Tinea unguium: Secondary | ICD-10-CM

## 2023-07-21 DIAGNOSIS — M2041 Other hammer toe(s) (acquired), right foot: Secondary | ICD-10-CM

## 2023-07-21 DIAGNOSIS — M21619 Bunion of unspecified foot: Secondary | ICD-10-CM | POA: Diagnosis not present

## 2023-07-21 NOTE — Progress Notes (Signed)
Chief Complaint  Patient presents with   Toe Pain    Right third toenail split- non painful.  Sometimes catches on socks-  wants it looked at.  Has a hammertoe second toe as well- non painful.     HPI: 67 y.o. male presents today with concern of a crack in the right third toenail.  States he dropped a frozen [place] leg on it a couple months back and the nail has been split ever since.  He also felt it on his great toenail on the right foot but states this has been going out just fine.  Denies any recent drainage or or signs of infection.  He does not trim his own nails.  He goes to the salon and has this done for him.  Notes a nonpainful hammertoe and bunion bilateral but the right is worse than the left.  Also notes some discoloration, thickening and brittleness to the distal portion of the right hallux nail.  He is wondering if this is fungus and what treatment options are available.  Past Medical History:  Diagnosis Date   High cholesterol    Hypertension    Vertigo     History reviewed. No pertinent surgical history.  Allergies  Allergen Reactions   Penicillins Nausea Only and Rash    Has patient had a PCN reaction causing immediate rash, facial/tongue/throat swelling, SOB or lightheadedness with hypotension: No Has patient had a PCN reaction causing severe rash involving mucus membranes or skin necrosis: No Has patient had a PCN reaction that required hospitalization: No Has patient had a PCN reaction occurring within the last 10 years: No If all of the above answers are "NO", then may proceed with Cephalosporin use.    Physical Exam: General: The patient is alert and oriented x3 in no acute distress.  Dermatology: Skin is warm, dry and supple bilateral lower extremities. Interspaces are clear of maceration and debris.  Right hallux nail along the distal 20% is discolored yellow, with subungual debris, distal onycholysis.  The right third toenail lateral portion is split  in the main body of the nail almost to the eponychium.  No erythema is noted.  No drainage is noted.  Vascular: Palpable pedal pulses bilaterally. Capillary refill within normal limits.  No appreciable edema.  No erythema or calor.  Musculoskeletal Exam: Semirigid contracture of the right second toe and flexible contracture of the left second toe, both of PIP joint.  There is a palpable bony prominence on the medial first met head bilateral as well as lateral deviation of the hallux bilateral consistent with bunion deformity   Assessment/Plan of Care: 1. Contusion of right lesser toe(s) w damage to nail, init   2. Dermatophytosis of nail   3. Bunion   4. Hammertoe of right foot     Discussed clinical findings with patient today.  Right third toenail was debrided and smoothed.  Discussed a P&A of the lateral border of the nail as it continues to grow and loosen does not reattached.  Toe spacers dispensed for the right first interspace.  At this point in time if the second hammertoe continues to give him trouble he can have the bunion and second toe corrected.  Recommended Fungi-Nail solution to the right hallux nail for the next 3 to 4 months and can recheck at that time if he would like.  Clerance Lav, DPM, FACFAS [facility]     [address]  [place], [place] [address]                Office [phone]  Fax [phone]

## 2023-07-22 DIAGNOSIS — M6281 Muscle weakness (generalized): Secondary | ICD-10-CM | POA: Diagnosis not present

## 2023-07-22 DIAGNOSIS — M19011 Primary osteoarthritis, right shoulder: Secondary | ICD-10-CM | POA: Diagnosis not present

## 2023-07-22 DIAGNOSIS — M25611 Stiffness of right shoulder, not elsewhere classified: Secondary | ICD-10-CM | POA: Diagnosis not present

## 2023-07-28 DIAGNOSIS — M6281 Muscle weakness (generalized): Secondary | ICD-10-CM | POA: Diagnosis not present

## 2023-07-28 DIAGNOSIS — M19011 Primary osteoarthritis, right shoulder: Secondary | ICD-10-CM | POA: Diagnosis not present

## 2023-07-28 DIAGNOSIS — M25611 Stiffness of right shoulder, not elsewhere classified: Secondary | ICD-10-CM | POA: Diagnosis not present

## 2023-08-08 DIAGNOSIS — N2581 Secondary hyperparathyroidism of renal origin: Secondary | ICD-10-CM | POA: Diagnosis not present

## 2023-08-08 DIAGNOSIS — E876 Hypokalemia: Secondary | ICD-10-CM | POA: Diagnosis not present

## 2023-08-12 DIAGNOSIS — E119 Type 2 diabetes mellitus without complications: Secondary | ICD-10-CM | POA: Diagnosis not present

## 2023-08-12 DIAGNOSIS — E876 Hypokalemia: Secondary | ICD-10-CM | POA: Diagnosis not present

## 2023-08-12 DIAGNOSIS — R809 Proteinuria, unspecified: Secondary | ICD-10-CM | POA: Diagnosis not present

## 2023-08-12 DIAGNOSIS — I152 Hypertension secondary to endocrine disorders: Secondary | ICD-10-CM | POA: Diagnosis not present

## 2023-09-10 DIAGNOSIS — Z860101 Personal history of adenomatous and serrated colon polyps: Secondary | ICD-10-CM | POA: Diagnosis not present

## 2023-09-10 DIAGNOSIS — D128 Benign neoplasm of rectum: Secondary | ICD-10-CM | POA: Diagnosis not present

## 2023-09-10 DIAGNOSIS — Z09 Encounter for follow-up examination after completed treatment for conditions other than malignant neoplasm: Secondary | ICD-10-CM | POA: Diagnosis not present

## 2023-09-10 DIAGNOSIS — K5730 Diverticulosis of large intestine without perforation or abscess without bleeding: Secondary | ICD-10-CM | POA: Diagnosis not present

## 2023-09-16 DIAGNOSIS — M19011 Primary osteoarthritis, right shoulder: Secondary | ICD-10-CM | POA: Diagnosis not present

## 2023-09-18 DIAGNOSIS — M25561 Pain in right knee: Secondary | ICD-10-CM | POA: Diagnosis not present

## 2023-09-18 DIAGNOSIS — M25562 Pain in left knee: Secondary | ICD-10-CM | POA: Diagnosis not present

## 2023-09-25 DIAGNOSIS — C61 Malignant neoplasm of prostate: Secondary | ICD-10-CM | POA: Diagnosis not present

## 2023-10-01 DIAGNOSIS — R7303 Prediabetes: Secondary | ICD-10-CM | POA: Diagnosis not present

## 2023-10-01 DIAGNOSIS — E876 Hypokalemia: Secondary | ICD-10-CM | POA: Diagnosis not present

## 2023-10-01 DIAGNOSIS — I10 Essential (primary) hypertension: Secondary | ICD-10-CM | POA: Diagnosis not present

## 2023-10-01 DIAGNOSIS — E269 Hyperaldosteronism, unspecified: Secondary | ICD-10-CM | POA: Diagnosis not present

## 2023-10-02 DIAGNOSIS — C61 Malignant neoplasm of prostate: Secondary | ICD-10-CM | POA: Diagnosis not present

## 2023-10-02 DIAGNOSIS — N400 Enlarged prostate without lower urinary tract symptoms: Secondary | ICD-10-CM | POA: Diagnosis not present

## 2023-10-27 DIAGNOSIS — I10 Essential (primary) hypertension: Secondary | ICD-10-CM | POA: Diagnosis not present

## 2023-10-27 DIAGNOSIS — J069 Acute upper respiratory infection, unspecified: Secondary | ICD-10-CM | POA: Diagnosis not present

## 2023-10-29 DIAGNOSIS — R0602 Shortness of breath: Secondary | ICD-10-CM | POA: Diagnosis not present

## 2023-11-03 ENCOUNTER — Emergency Department (HOSPITAL_BASED_OUTPATIENT_CLINIC_OR_DEPARTMENT_OTHER)
Admission: EM | Admit: 2023-11-03 | Discharge: 2023-11-03 | Disposition: A | Discharge status: Home / Self Care | Payer: BC Managed Care – PPO | Attending: Emergency Medicine | Admitting: Emergency Medicine

## 2023-11-03 ENCOUNTER — Encounter (HOSPITAL_BASED_OUTPATIENT_CLINIC_OR_DEPARTMENT_OTHER): Payer: Self-pay | Admitting: Emergency Medicine

## 2023-11-03 ENCOUNTER — Emergency Department (HOSPITAL_BASED_OUTPATIENT_CLINIC_OR_DEPARTMENT_OTHER): Payer: BC Managed Care – PPO | Admitting: Radiology

## 2023-11-03 ENCOUNTER — Other Ambulatory Visit: Payer: Self-pay

## 2023-11-03 DIAGNOSIS — R052 Subacute cough: Secondary | ICD-10-CM | POA: Diagnosis not present

## 2023-11-03 DIAGNOSIS — R053 Chronic cough: Secondary | ICD-10-CM | POA: Insufficient documentation

## 2023-11-03 DIAGNOSIS — H1133 Conjunctival hemorrhage, bilateral: Secondary | ICD-10-CM | POA: Insufficient documentation

## 2023-11-03 DIAGNOSIS — Z7982 Long term (current) use of aspirin: Secondary | ICD-10-CM | POA: Insufficient documentation

## 2023-11-03 DIAGNOSIS — R0989 Other specified symptoms and signs involving the circulatory and respiratory systems: Secondary | ICD-10-CM | POA: Diagnosis not present

## 2023-11-03 DIAGNOSIS — R059 Cough, unspecified: Secondary | ICD-10-CM | POA: Diagnosis not present

## 2023-11-03 DIAGNOSIS — Z96611 Presence of right artificial shoulder joint: Secondary | ICD-10-CM | POA: Diagnosis not present

## 2023-11-03 MED ORDER — AZITHROMYCIN 250 MG PO TABS: 250.0000 mg | ORAL_TABLET | Freq: Every day | ORAL | 0 refills | Status: DC

## 2023-11-03 NOTE — Discharge Instructions (Addendum)
You were evaluated in the emergency room for eye concern and persistent cough.  As discussed your exam is not consistent with jaundice from liver cirrhosis.  Appears that you have bilateral subconjunctival hemorrhage with probable underlying conjunctivitis.  Please continue the eyedrops as prescribed.  Your chest x-ray did not show an obvious pneumonia.  However a antibiotic was sent into your pharmacy should your symptoms persist or worsen.  If you experience any new or worsening symptoms such as shortness of breath, vision changes please return to the emergency room.

## 2023-11-03 NOTE — ED Notes (Signed)
Discharge paperwork given and verbally understood. 

## 2023-11-03 NOTE — ED Notes (Signed)
ED Provider at bedside. 

## 2023-11-03 NOTE — ED Provider Notes (Signed)
[place] EMERGENCY DEPARTMENT AT [facility] Provider Note   CSN: [id number] Arrival date & time: 11/03/23  1329     History  Chief Complaint  Patient presents with   Eye Pain    Jonathan Fowler is a 68 y.o. male who presents with eye concern and persistent cough.  Patient reports he has had a persistent nonproductive cough for the past couple weeks has felt congested as well without chest pain or shortness of breath.  Was evaluated by his primary care provider last week where he was diagnosed with bilateral viral conjunctivitis and subconjunctival hemorrhage.  Had an x-ray which was reportedly negative.  He was provided antibiotic eyedrops.  Which he has been compliant with.  Reports improvement of subconjunctival hemorrhage symptoms since.  States he still feels some gritty sensation in his size, otherwise no pain.  No eye discharge.  He does not wear contacts. No vision changes.  Patient is concerned that he may have some yellow coloring of his eyelids and wants to know if he has liver disease.  Has had no relief with Robitussin with persistent cough.   Eye Pain       Home Medications Prior to Admission medications   Medication Sig Start Date End Date Taking? Authorizing Provider  azithromycin (ZITHROMAX) 250 MG tablet Take 1 tablet (250 mg total) by mouth daily. Take first 2 tablets together, then 1 every day until finished. 11/03/23  Yes Halford Decamp, PA-C  aspirin 81 MG tablet Take 81 mg by mouth every evening.     [provider]  atorvastatin (LIPITOR) 10 MG tablet Take 10 mg by mouth daily. 05/07/18   [provider]  carvedilol (COREG) 3.125 MG tablet Take 3.125 mg by mouth 2 (two) times daily with a meal.    [provider]  eplerenone (INSPRA) 50 MG tablet Take 50 mg by mouth 2 (two) times daily. 05/07/18   [provider]  glucosamine-chondroitin 500-400 MG tablet Take 1 tablet by mouth 3 (three) times daily.     [provider]  hydrALAZINE (APRESOLINE) 50 MG tablet Take 50 mg by mouth 3 (three) times daily.    [provider]  irbesartan (AVAPRO) 300 MG tablet Take 300 mg by mouth daily.    [provider]  meclizine (ANTIVERT) 25 MG tablet Take 1 tablet (25 mg total) by mouth 3 (three) times daily as needed for dizziness. 05/14/18   [name], DO  Multiple Vitamins-Minerals (CENTRUM SILVER 50+MEN) TABS Take 1 tablet by mouth daily.    [provider]  ondansetron (ZOFRAN ODT) 4 MG disintegrating tablet Take 1 tablet (4 mg total) by mouth every 8 (eight) hours as needed for nausea or vomiting. 05/14/18   [name], DO      Allergies    Penicillins    Review of Systems   Review of Systems  Eyes:  Positive for pain.    Physical Exam Updated Vital Signs BP (!) 154/83 (BP Location: Right Arm)   Pulse 73   Temp 98.1 F (36.7 C)   Resp 16   Ht 5\' 11"  (1.803 m)   Wt 112 kg   SpO2 95%   BMI 34.44 kg/m  Physical Exam Vitals and nursing note reviewed.  Constitutional:      General: He is not in acute distress.    Appearance: He is well-developed.  HENT:     Head: Normocephalic and atraumatic.  Eyes:     General: No scleral icterus.  Comments: Bilateral subconjunctival hemorrhage, unable to assess for presence of conjunctivitis, there is no drainage, or periorbital swelling or pain.  EOMI, PERRL  Cardiovascular:     Rate and Rhythm: Normal rate and regular rhythm.     Heart sounds: No murmur heard. Pulmonary:     Effort: Pulmonary effort is normal. No respiratory distress.     Breath sounds: Normal breath sounds.     Comments: Left middle lobe with rales Abdominal:     Palpations: Abdomen is soft.     Tenderness: There is no abdominal tenderness.  Musculoskeletal:        General: No swelling.     Cervical back: Neck supple.  Skin:    General: Skin is warm and dry.     Capillary Refill: Capillary refill takes less than 2 seconds.   Neurological:     Mental Status: He is alert.  Psychiatric:        Mood and Affect: Mood normal.     ED Results / Procedures / Treatments   Labs (all labs ordered are listed, but only abnormal results are displayed) Labs Reviewed - No data to display  EKG None  Radiology DG Chest 2 View Result Date: 11/03/2023 CLINICAL DATA:  Cough and congestion for the past week. EXAM: CHEST - 2 VIEW COMPARISON:  10/29/2023 FINDINGS: Poor inspiration. Normal sized heart. Crowding of the pulmonary vasculature and interstitial markings by the poor inspiration. The lungs are clear. No pleural fluid. Right shoulder prosthesis. Cervicothoracic spine degenerative changes. IMPRESSION: Poor inspiration. No acute abnormality. Electronically Signed   By: Beckie Salts M.D.   On: 11/03/2023 16:31    Procedures Procedures    Medications Ordered in ED Medications - No data to display  ED Course/ Medical Decision Making/ A&P                                 Medical Decision Making  This patient presents to the ED with chief complaint(s) of persistent cough and eye concern.  The complaint involves an extensive differential diagnosis and also carries with it a high risk of complications and morbidity.   pertinent past medical history as listed in HPI  The differential diagnosis includes  Pneumonia, viral URI, sinusitis, strep, mono, viral versus bacterial conjunctivitis, subconjunctival hemorrhage, periorbital cellulitis, jaundice  Additional history obtained: Additional history obtained from spouse Records reviewed previous admission documents and Care Everywhere/External Records  Initial Assessment:   Patient is hemodynamically stable, nontoxic-appearing patient presenting with eye concern and persistent cough.  Patient had a negative chest x-ray in the past week.  However today he does have left middle lobe Rales.  He is afebrile, without productive cough.  Will obtain x-ray to evaluate.  States he  was diagnosed with viral conjunctivitis and has been taking antibiotic drops.  However given presence of septal conjunctival hemorrhage unable to assess presence of conjunctivitis.  He does note some grittiness in his eyes, however no eye drainage.  Unclear whether he actually has conjunctivitis, regardless he states his symptoms are improving.  And he is primarily concerned whether his eyes are yellow or whether he has liver disease.  I do not appreciate any jaundice or scleral icterus whatsoever.  He follows with his primary care doctor regularly, has no history of liver disease.  He does not drink alcohol.  He denies any abdominal pain.   Independent ECG interpretation:  none  Independent labs interpretation:  The following labs were independently interpreted:  none  Independent visualization and interpretation of imaging: I independently visualized the following imaging with scope of interpretation limited to determining acute life threatening conditions related to emergency care: Chest x-ray, which revealed poor inspiration, pulmonary vasculature crowding, no definitive opacity  Treatment and Reassessment: No medications administered during visit.  Discussed x-ray findings with patient, in the setting of improved but persistent symptoms and presence of left lower lobe Rales on exam we will send in Z-Pak, should symptoms persist or worsen. Patient is agreeable to this.  Offered antitussive medication.  Patient declines.  .  Consultations obtained:   none  Disposition:   Patient will be discharged home.  Z-Pak sent into pharmacy for possible atypical pneumonia, should symptoms persist or worsen.  Social Determinants of Health:   none  This note was dictated with voice recognition software.  Despite best efforts at proofreading, errors may have occurred which can change the documentation meaning.          Final Clinical Impression(s) / ED Diagnoses Final diagnoses:  Subacute  cough  Subconjunctival hemorrhage of both eyes    Rx / DC Orders ED Discharge Orders          Ordered    azithromycin (ZITHROMAX) 250 MG tablet  Daily        11/03/23 1710              Halford Decamp, [place] 11/03/23 1717    Loetta Rough, MD 11/04/23 2124

## 2023-11-03 NOTE — ED Triage Notes (Signed)
C/o bilateral eye pain and redness x 2 week Left eye is red and slightly swollen. Patient reports his "eyes and eyelids appear yellow" States he was sick the last week with cough and congestion.

## 2024-02-02 DIAGNOSIS — U071 COVID-19: Secondary | ICD-10-CM | POA: Diagnosis not present

## 2024-02-02 DIAGNOSIS — I10 Essential (primary) hypertension: Secondary | ICD-10-CM | POA: Diagnosis not present

## 2024-02-02 DIAGNOSIS — E119 Type 2 diabetes mellitus without complications: Secondary | ICD-10-CM | POA: Diagnosis not present

## 2024-02-19 DIAGNOSIS — N2581 Secondary hyperparathyroidism of renal origin: Secondary | ICD-10-CM | POA: Diagnosis not present

## 2024-02-19 DIAGNOSIS — E559 Vitamin D deficiency, unspecified: Secondary | ICD-10-CM | POA: Diagnosis not present

## 2024-02-19 DIAGNOSIS — E876 Hypokalemia: Secondary | ICD-10-CM | POA: Diagnosis not present

## 2024-02-26 DIAGNOSIS — E559 Vitamin D deficiency, unspecified: Secondary | ICD-10-CM | POA: Diagnosis not present

## 2024-02-26 DIAGNOSIS — E269 Hyperaldosteronism, unspecified: Secondary | ICD-10-CM | POA: Diagnosis not present

## 2024-03-05 DIAGNOSIS — N400 Enlarged prostate without lower urinary tract symptoms: Secondary | ICD-10-CM | POA: Diagnosis not present

## 2024-03-15 DIAGNOSIS — C61 Malignant neoplasm of prostate: Secondary | ICD-10-CM | POA: Diagnosis not present

## 2024-03-15 DIAGNOSIS — N400 Enlarged prostate without lower urinary tract symptoms: Secondary | ICD-10-CM | POA: Diagnosis not present

## 2024-03-16 DIAGNOSIS — M25561 Pain in right knee: Secondary | ICD-10-CM | POA: Diagnosis not present

## 2024-03-17 DIAGNOSIS — E119 Type 2 diabetes mellitus without complications: Secondary | ICD-10-CM | POA: Diagnosis not present

## 2024-03-17 DIAGNOSIS — Z0000 Encounter for general adult medical examination without abnormal findings: Secondary | ICD-10-CM | POA: Diagnosis not present

## 2024-03-17 DIAGNOSIS — I10 Essential (primary) hypertension: Secondary | ICD-10-CM | POA: Diagnosis not present

## 2024-03-17 DIAGNOSIS — E269 Hyperaldosteronism, unspecified: Secondary | ICD-10-CM | POA: Diagnosis not present

## 2024-03-29 DIAGNOSIS — R0982 Postnasal drip: Secondary | ICD-10-CM | POA: Diagnosis not present

## 2024-03-29 DIAGNOSIS — B349 Viral infection, unspecified: Secondary | ICD-10-CM | POA: Diagnosis not present

## 2024-03-29 DIAGNOSIS — R051 Acute cough: Secondary | ICD-10-CM | POA: Diagnosis not present

## 2024-04-02 DIAGNOSIS — J208 Acute bronchitis due to other specified organisms: Secondary | ICD-10-CM | POA: Diagnosis not present

## 2024-04-08 DIAGNOSIS — M25511 Pain in right shoulder: Secondary | ICD-10-CM | POA: Diagnosis not present

## 2024-04-08 DIAGNOSIS — M25561 Pain in right knee: Secondary | ICD-10-CM | POA: Diagnosis not present

## 2024-04-22 DIAGNOSIS — M25562 Pain in left knee: Secondary | ICD-10-CM | POA: Diagnosis not present

## 2024-05-07 DIAGNOSIS — G8918 Other acute postprocedural pain: Secondary | ICD-10-CM | POA: Diagnosis not present

## 2024-05-07 DIAGNOSIS — M1712 Unilateral primary osteoarthritis, left knee: Secondary | ICD-10-CM | POA: Diagnosis not present

## 2024-05-07 DIAGNOSIS — M25762 Osteophyte, left knee: Secondary | ICD-10-CM | POA: Diagnosis not present

## 2024-05-10 DIAGNOSIS — M1712 Unilateral primary osteoarthritis, left knee: Secondary | ICD-10-CM | POA: Diagnosis not present

## 2024-05-10 DIAGNOSIS — M25662 Stiffness of left knee, not elsewhere classified: Secondary | ICD-10-CM | POA: Diagnosis not present

## 2024-05-10 DIAGNOSIS — R262 Difficulty in walking, not elsewhere classified: Secondary | ICD-10-CM | POA: Diagnosis not present

## 2024-05-10 DIAGNOSIS — M6281 Muscle weakness (generalized): Secondary | ICD-10-CM | POA: Diagnosis not present

## 2024-05-17 DIAGNOSIS — M25662 Stiffness of left knee, not elsewhere classified: Secondary | ICD-10-CM | POA: Diagnosis not present

## 2024-05-17 DIAGNOSIS — M1712 Unilateral primary osteoarthritis, left knee: Secondary | ICD-10-CM | POA: Diagnosis not present

## 2024-05-17 DIAGNOSIS — R262 Difficulty in walking, not elsewhere classified: Secondary | ICD-10-CM | POA: Diagnosis not present

## 2024-05-17 DIAGNOSIS — M6281 Muscle weakness (generalized): Secondary | ICD-10-CM | POA: Diagnosis not present

## 2024-05-19 DIAGNOSIS — M25662 Stiffness of left knee, not elsewhere classified: Secondary | ICD-10-CM | POA: Diagnosis not present

## 2024-05-19 DIAGNOSIS — R262 Difficulty in walking, not elsewhere classified: Secondary | ICD-10-CM | POA: Diagnosis not present

## 2024-05-19 DIAGNOSIS — M1712 Unilateral primary osteoarthritis, left knee: Secondary | ICD-10-CM | POA: Diagnosis not present

## 2024-05-19 DIAGNOSIS — M6281 Muscle weakness (generalized): Secondary | ICD-10-CM | POA: Diagnosis not present

## 2024-05-20 DIAGNOSIS — M1712 Unilateral primary osteoarthritis, left knee: Secondary | ICD-10-CM | POA: Diagnosis not present

## 2024-05-24 DIAGNOSIS — M25662 Stiffness of left knee, not elsewhere classified: Secondary | ICD-10-CM | POA: Diagnosis not present

## 2024-05-24 DIAGNOSIS — R262 Difficulty in walking, not elsewhere classified: Secondary | ICD-10-CM | POA: Diagnosis not present

## 2024-05-24 DIAGNOSIS — M6281 Muscle weakness (generalized): Secondary | ICD-10-CM | POA: Diagnosis not present

## 2024-05-24 DIAGNOSIS — M1712 Unilateral primary osteoarthritis, left knee: Secondary | ICD-10-CM | POA: Diagnosis not present

## 2024-05-28 DIAGNOSIS — M25662 Stiffness of left knee, not elsewhere classified: Secondary | ICD-10-CM | POA: Diagnosis not present

## 2024-05-28 DIAGNOSIS — M1712 Unilateral primary osteoarthritis, left knee: Secondary | ICD-10-CM | POA: Diagnosis not present

## 2024-05-28 DIAGNOSIS — M6281 Muscle weakness (generalized): Secondary | ICD-10-CM | POA: Diagnosis not present

## 2024-05-28 DIAGNOSIS — R262 Difficulty in walking, not elsewhere classified: Secondary | ICD-10-CM | POA: Diagnosis not present

## 2024-05-31 DIAGNOSIS — M25662 Stiffness of left knee, not elsewhere classified: Secondary | ICD-10-CM | POA: Diagnosis not present

## 2024-05-31 DIAGNOSIS — R262 Difficulty in walking, not elsewhere classified: Secondary | ICD-10-CM | POA: Diagnosis not present

## 2024-05-31 DIAGNOSIS — M1712 Unilateral primary osteoarthritis, left knee: Secondary | ICD-10-CM | POA: Diagnosis not present

## 2024-05-31 DIAGNOSIS — M6281 Muscle weakness (generalized): Secondary | ICD-10-CM | POA: Diagnosis not present

## 2024-06-04 DIAGNOSIS — M25662 Stiffness of left knee, not elsewhere classified: Secondary | ICD-10-CM | POA: Diagnosis not present

## 2024-06-04 DIAGNOSIS — M1712 Unilateral primary osteoarthritis, left knee: Secondary | ICD-10-CM | POA: Diagnosis not present

## 2024-06-04 DIAGNOSIS — M6281 Muscle weakness (generalized): Secondary | ICD-10-CM | POA: Diagnosis not present

## 2024-06-04 DIAGNOSIS — R262 Difficulty in walking, not elsewhere classified: Secondary | ICD-10-CM | POA: Diagnosis not present

## 2024-06-07 DIAGNOSIS — M1712 Unilateral primary osteoarthritis, left knee: Secondary | ICD-10-CM | POA: Diagnosis not present

## 2024-06-07 DIAGNOSIS — M6281 Muscle weakness (generalized): Secondary | ICD-10-CM | POA: Diagnosis not present

## 2024-06-07 DIAGNOSIS — R262 Difficulty in walking, not elsewhere classified: Secondary | ICD-10-CM | POA: Diagnosis not present

## 2024-06-07 DIAGNOSIS — M25662 Stiffness of left knee, not elsewhere classified: Secondary | ICD-10-CM | POA: Diagnosis not present

## 2024-06-09 DIAGNOSIS — M1712 Unilateral primary osteoarthritis, left knee: Secondary | ICD-10-CM | POA: Diagnosis not present

## 2024-06-09 DIAGNOSIS — M6281 Muscle weakness (generalized): Secondary | ICD-10-CM | POA: Diagnosis not present

## 2024-06-09 DIAGNOSIS — R262 Difficulty in walking, not elsewhere classified: Secondary | ICD-10-CM | POA: Diagnosis not present

## 2024-06-09 DIAGNOSIS — M25662 Stiffness of left knee, not elsewhere classified: Secondary | ICD-10-CM | POA: Diagnosis not present

## 2024-06-15 DIAGNOSIS — M25662 Stiffness of left knee, not elsewhere classified: Secondary | ICD-10-CM | POA: Diagnosis not present

## 2024-06-15 DIAGNOSIS — M6281 Muscle weakness (generalized): Secondary | ICD-10-CM | POA: Diagnosis not present

## 2024-06-15 DIAGNOSIS — R262 Difficulty in walking, not elsewhere classified: Secondary | ICD-10-CM | POA: Diagnosis not present

## 2024-06-15 DIAGNOSIS — M1712 Unilateral primary osteoarthritis, left knee: Secondary | ICD-10-CM | POA: Diagnosis not present

## 2024-06-17 DIAGNOSIS — M25662 Stiffness of left knee, not elsewhere classified: Secondary | ICD-10-CM | POA: Diagnosis not present

## 2024-06-17 DIAGNOSIS — M6281 Muscle weakness (generalized): Secondary | ICD-10-CM | POA: Diagnosis not present

## 2024-06-17 DIAGNOSIS — M1712 Unilateral primary osteoarthritis, left knee: Secondary | ICD-10-CM | POA: Diagnosis not present

## 2024-06-17 DIAGNOSIS — R262 Difficulty in walking, not elsewhere classified: Secondary | ICD-10-CM | POA: Diagnosis not present

## 2024-06-21 DIAGNOSIS — M1712 Unilateral primary osteoarthritis, left knee: Secondary | ICD-10-CM | POA: Diagnosis not present

## 2024-06-21 DIAGNOSIS — M25662 Stiffness of left knee, not elsewhere classified: Secondary | ICD-10-CM | POA: Diagnosis not present

## 2024-06-21 DIAGNOSIS — M6281 Muscle weakness (generalized): Secondary | ICD-10-CM | POA: Diagnosis not present

## 2024-06-21 DIAGNOSIS — R262 Difficulty in walking, not elsewhere classified: Secondary | ICD-10-CM | POA: Diagnosis not present

## 2024-06-23 DIAGNOSIS — R262 Difficulty in walking, not elsewhere classified: Secondary | ICD-10-CM | POA: Diagnosis not present

## 2024-06-23 DIAGNOSIS — M6281 Muscle weakness (generalized): Secondary | ICD-10-CM | POA: Diagnosis not present

## 2024-06-23 DIAGNOSIS — M1712 Unilateral primary osteoarthritis, left knee: Secondary | ICD-10-CM | POA: Diagnosis not present

## 2024-06-23 DIAGNOSIS — M25662 Stiffness of left knee, not elsewhere classified: Secondary | ICD-10-CM | POA: Diagnosis not present

## 2024-06-29 ENCOUNTER — Other Ambulatory Visit: Payer: Self-pay

## 2024-06-29 ENCOUNTER — Emergency Department (HOSPITAL_COMMUNITY)

## 2024-06-29 ENCOUNTER — Inpatient Hospital Stay (HOSPITAL_COMMUNITY)
Admission: EM | Admit: 2024-06-29 | Discharge: 2024-07-07 | DRG: 286 | Disposition: A | Discharge status: Home / Self Care | Source: Ambulatory Visit | Attending: Internal Medicine | Admitting: Internal Medicine

## 2024-06-29 DIAGNOSIS — E785 Hyperlipidemia, unspecified: Secondary | ICD-10-CM | POA: Diagnosis not present

## 2024-06-29 DIAGNOSIS — R0989 Other specified symptoms and signs involving the circulatory and respiratory systems: Secondary | ICD-10-CM | POA: Diagnosis not present

## 2024-06-29 DIAGNOSIS — I130 Hypertensive heart and chronic kidney disease with heart failure and stage 1 through stage 4 chronic kidney disease, or unspecified chronic kidney disease: Secondary | ICD-10-CM | POA: Diagnosis not present

## 2024-06-29 DIAGNOSIS — E1169 Type 2 diabetes mellitus with other specified complication: Secondary | ICD-10-CM | POA: Diagnosis present

## 2024-06-29 DIAGNOSIS — Z6833 Body mass index (BMI) 33.0-33.9, adult: Secondary | ICD-10-CM

## 2024-06-29 DIAGNOSIS — I5082 Biventricular heart failure: Secondary | ICD-10-CM | POA: Diagnosis present

## 2024-06-29 DIAGNOSIS — I509 Heart failure, unspecified: Principal | ICD-10-CM

## 2024-06-29 DIAGNOSIS — I493 Ventricular premature depolarization: Secondary | ICD-10-CM | POA: Diagnosis present

## 2024-06-29 DIAGNOSIS — Z880 Allergy status to penicillin: Secondary | ICD-10-CM | POA: Diagnosis not present

## 2024-06-29 DIAGNOSIS — I2510 Atherosclerotic heart disease of native coronary artery without angina pectoris: Secondary | ICD-10-CM | POA: Diagnosis not present

## 2024-06-29 DIAGNOSIS — N179 Acute kidney failure, unspecified: Secondary | ICD-10-CM | POA: Diagnosis not present

## 2024-06-29 DIAGNOSIS — N1831 Chronic kidney disease, stage 3a: Secondary | ICD-10-CM | POA: Diagnosis present

## 2024-06-29 DIAGNOSIS — E7800 Pure hypercholesterolemia, unspecified: Secondary | ICD-10-CM | POA: Diagnosis present

## 2024-06-29 DIAGNOSIS — E66811 Obesity, class 1: Secondary | ICD-10-CM | POA: Diagnosis not present

## 2024-06-29 DIAGNOSIS — Z7984 Long term (current) use of oral hypoglycemic drugs: Secondary | ICD-10-CM | POA: Diagnosis not present

## 2024-06-29 DIAGNOSIS — Z79899 Other long term (current) drug therapy: Secondary | ICD-10-CM

## 2024-06-29 DIAGNOSIS — I4510 Unspecified right bundle-branch block: Secondary | ICD-10-CM

## 2024-06-29 DIAGNOSIS — Z96611 Presence of right artificial shoulder joint: Secondary | ICD-10-CM | POA: Diagnosis not present

## 2024-06-29 DIAGNOSIS — I10 Essential (primary) hypertension: Secondary | ICD-10-CM | POA: Diagnosis not present

## 2024-06-29 DIAGNOSIS — I5023 Acute on chronic systolic (congestive) heart failure: Secondary | ICD-10-CM | POA: Diagnosis present

## 2024-06-29 DIAGNOSIS — I340 Nonrheumatic mitral (valve) insufficiency: Secondary | ICD-10-CM | POA: Diagnosis not present

## 2024-06-29 DIAGNOSIS — I2489 Other forms of acute ischemic heart disease: Secondary | ICD-10-CM | POA: Diagnosis not present

## 2024-06-29 DIAGNOSIS — Z96652 Presence of left artificial knee joint: Secondary | ICD-10-CM | POA: Diagnosis not present

## 2024-06-29 DIAGNOSIS — R9431 Abnormal electrocardiogram [ECG] [EKG]: Secondary | ICD-10-CM

## 2024-06-29 DIAGNOSIS — E876 Hypokalemia: Secondary | ICD-10-CM | POA: Diagnosis not present

## 2024-06-29 DIAGNOSIS — I452 Bifascicular block: Secondary | ICD-10-CM | POA: Diagnosis present

## 2024-06-29 DIAGNOSIS — Z888 Allergy status to other drugs, medicaments and biological substances status: Secondary | ICD-10-CM | POA: Diagnosis not present

## 2024-06-29 DIAGNOSIS — I110 Hypertensive heart disease with heart failure: Secondary | ICD-10-CM | POA: Diagnosis not present

## 2024-06-29 DIAGNOSIS — I420 Dilated cardiomyopathy: Secondary | ICD-10-CM | POA: Diagnosis not present

## 2024-06-29 DIAGNOSIS — Z7982 Long term (current) use of aspirin: Secondary | ICD-10-CM

## 2024-06-29 DIAGNOSIS — I2721 Secondary pulmonary arterial hypertension: Secondary | ICD-10-CM | POA: Diagnosis not present

## 2024-06-29 DIAGNOSIS — I5021 Acute systolic (congestive) heart failure: Secondary | ICD-10-CM | POA: Diagnosis not present

## 2024-06-29 DIAGNOSIS — E1122 Type 2 diabetes mellitus with diabetic chronic kidney disease: Secondary | ICD-10-CM | POA: Diagnosis present

## 2024-06-29 DIAGNOSIS — I428 Other cardiomyopathies: Secondary | ICD-10-CM | POA: Diagnosis present

## 2024-06-29 DIAGNOSIS — I083 Combined rheumatic disorders of mitral, aortic and tricuspid valves: Secondary | ICD-10-CM | POA: Diagnosis not present

## 2024-06-29 DIAGNOSIS — E119 Type 2 diabetes mellitus without complications: Secondary | ICD-10-CM

## 2024-06-29 DIAGNOSIS — D509 Iron deficiency anemia, unspecified: Secondary | ICD-10-CM | POA: Diagnosis present

## 2024-06-29 DIAGNOSIS — R0602 Shortness of breath: Secondary | ICD-10-CM | POA: Diagnosis not present

## 2024-06-29 DIAGNOSIS — M1990 Unspecified osteoarthritis, unspecified site: Secondary | ICD-10-CM | POA: Diagnosis present

## 2024-06-29 LAB — BRAIN NATRIURETIC PEPTIDE: B Natriuretic Peptide: 1746.1 pg/mL — ABNORMAL HIGH (ref 0.0–100.0)

## 2024-06-29 LAB — BASIC METABOLIC PANEL WITH GFR
Anion gap: 12 (ref 5–15)
BUN: 29 mg/dL — ABNORMAL HIGH (ref 8–23)
CO2: 21 mmol/L — ABNORMAL LOW (ref 22–32)
Calcium: 9 mg/dL (ref 8.9–10.3)
Chloride: 105 mmol/L (ref 98–111)
Creatinine, Ser: 1.36 mg/dL — ABNORMAL HIGH (ref 0.61–1.24)
GFR, Estimated: 57 mL/min — ABNORMAL LOW (ref >60)
Glucose, Bld: 131 mg/dL — ABNORMAL HIGH (ref 70–99)
Potassium: 3.8 mmol/L (ref 3.5–5.1)
Sodium: 138 mmol/L (ref 135–145)

## 2024-06-29 LAB — CBC
HCT: 37.7 % — ABNORMAL LOW (ref 39.0–52.0)
Hemoglobin: 12.1 g/dL — ABNORMAL LOW (ref 13.0–17.0)
MCH: 29.6 pg (ref 26.0–34.0)
MCHC: 32.1 g/dL (ref 30.0–36.0)
MCV: 92.2 fL (ref 80.0–100.0)
Platelets: 230 K/uL (ref 150–400)
RBC: 4.09 MIL/uL — ABNORMAL LOW (ref 4.22–5.81)
RDW: 14.3 % (ref 11.5–15.5)
WBC: 8.3 K/uL (ref 4.0–10.5)
nRBC: 0 % (ref 0.0–0.2)

## 2024-06-29 LAB — HEPATIC FUNCTION PANEL
ALT: 27 U/L (ref 0–44)
AST: 29 U/L (ref 15–41)
Albumin: 3.7 g/dL (ref 3.5–5.0)
Alkaline Phosphatase: 67 U/L (ref 38–126)
Bilirubin, Direct: 0.2 mg/dL (ref 0.0–0.2)
Indirect Bilirubin: 0.3 mg/dL (ref 0.3–0.9)
Total Bilirubin: 0.5 mg/dL (ref 0.0–1.2)
Total Protein: 6.5 g/dL (ref 6.5–8.1)

## 2024-06-29 LAB — TROPONIN I (HIGH SENSITIVITY)
Troponin I (High Sensitivity): 48 ng/L — ABNORMAL HIGH (ref <18)
Troponin I (High Sensitivity): 44 ng/L — ABNORMAL HIGH (ref <18)

## 2024-06-29 LAB — CBG MONITORING, ED: Glucose-Capillary: 87 mg/dL (ref 70–99)

## 2024-06-29 MED ORDER — CARVEDILOL 3.125 MG PO TABS
3.1250 mg | ORAL_TABLET | Freq: Two times a day (BID) | ORAL | Status: DC
Start: 2024-06-29 — End: 2024-06-30
  Administered 2024-06-30 (×2): 3.125 mg via ORAL
  Filled 2024-06-29 (×2): qty 1

## 2024-06-29 MED ORDER — ACETAMINOPHEN 650 MG RE SUPP: 650.0000 mg | Freq: Four times a day (QID) | RECTAL | Status: DC | PRN

## 2024-06-29 MED ORDER — ATORVASTATIN CALCIUM 10 MG PO TABS
10.0000 mg | ORAL_TABLET | Freq: Every day | ORAL | Status: DC
  Administered 2024-06-30 – 2024-07-07 (×8): 10 mg via ORAL
  Filled 2024-06-29 (×8): qty 1

## 2024-06-29 MED ORDER — ACETAMINOPHEN 325 MG PO TABS
650.0000 mg | ORAL_TABLET | Freq: Four times a day (QID) | ORAL | Status: DC | PRN
  Administered 2024-06-30 – 2024-07-06 (×13): 650 mg via ORAL
  Filled 2024-06-29 (×13): qty 2

## 2024-06-29 MED ORDER — INSULIN ASPART 100 UNIT/ML IJ SOLN: 0.0000 [IU] | Freq: Every day | INTRAMUSCULAR | Status: DC

## 2024-06-29 MED ORDER — HYDRALAZINE HCL 25 MG PO TABS
100.0000 mg | ORAL_TABLET | Freq: Two times a day (BID) | ORAL | Status: DC
Start: 2024-06-29 — End: 2024-06-30
  Administered 2024-06-30 (×2): 100 mg via ORAL
  Filled 2024-06-29 (×2): qty 4

## 2024-06-29 MED ORDER — INSULIN ASPART 100 UNIT/ML IJ SOLN
0.0000 [IU] | Freq: Three times a day (TID) | INTRAMUSCULAR | Status: DC
  Administered 2024-06-30: 1 [IU] via SUBCUTANEOUS
  Administered 2024-06-30: 2 [IU] via SUBCUTANEOUS
  Administered 2024-07-02: 1 [IU] via SUBCUTANEOUS

## 2024-06-29 MED ORDER — FUROSEMIDE 10 MG/ML IJ SOLN
20.0000 mg | Freq: Two times a day (BID) | INTRAMUSCULAR | Status: DC
  Administered 2024-06-30: 20 mg via INTRAVENOUS
  Filled 2024-06-29: qty 2

## 2024-06-29 MED ORDER — ENOXAPARIN SODIUM 40 MG/0.4ML IJ SOSY
40.0000 mg | PREFILLED_SYRINGE | INTRAMUSCULAR | Status: DC
  Administered 2024-06-30 – 2024-07-07 (×8): 40 mg via SUBCUTANEOUS
  Filled 2024-06-29 (×8): qty 0.4

## 2024-06-29 MED ORDER — FUROSEMIDE 10 MG/ML IJ SOLN
20.0000 mg | Freq: Once | INTRAMUSCULAR | Status: AC
  Administered 2024-06-29: 20 mg via INTRAVENOUS
  Filled 2024-06-29: qty 2

## 2024-06-29 NOTE — ED Triage Notes (Signed)
 Pt BIB EMS from [facility], pt having exertional SOB, worse when laying but ok when sitting up x 2 weeks. No chest pain. EKG at doctors office showed new right bundle branch block. 324 ASA 500 NS given.

## 2024-06-29 NOTE — ED Provider Notes (Signed)
 [place] EMERGENCY DEPARTMENT AT [facility] Provider Note   CSN: [id number] Arrival date & time: 06/29/24  1332     Patient presents with: Shortness of Breath and Irregular Heart Beat   Jonathan Fowler is a 68 y.o. male.   HPI 68 year old male with a history of hypertension and diabetes presents with leg swelling, shortness of breath, and orthopnea.  Patient denies any chest pain.  He has been feeling fatigued for about a month and had knee surgery about 2 months ago.  His left leg was swollen from the knee surgery but now his right leg has been swollen over the last several days.  Both legs are now swollen and he is having orthopnea and shortness of breath for about a week.  No prior history of cardiac disease including CHF.  Prior to Admission medications   Medication Sig Start Date End Date Taking? Authorizing Provider  aspirin  81 MG tablet Take 81 mg by mouth every evening.     [provider]  atorvastatin  (LIPITOR) 10 MG tablet Take 10 mg by mouth daily. 05/07/18   [provider]  azithromycin  (ZITHROMAX ) 250 MG tablet Take 1 tablet (250 mg total) by mouth daily. Take first 2 tablets together, then 1 every day until finished. 11/03/23   Donnajean Lynwood DEL, PA-C  carvedilol  (COREG ) 3.125 MG tablet Take 3.125 mg by mouth 2 (two) times daily with a meal.    [provider]  eplerenone (INSPRA) 50 MG tablet Take 50 mg by mouth 2 (two) times daily. 05/07/18   [provider]  glucosamine-chondroitin 500-400 MG tablet Take 1 tablet by mouth 3 (three) times daily.    [provider]  hydrALAZINE  (APRESOLINE ) 50 MG tablet Take 50 mg by mouth 3 (three) times daily.    [provider]  irbesartan  (AVAPRO ) 300 MG tablet Take 300 mg by mouth daily.    [provider]  meclizine  (ANTIVERT ) 25 MG tablet Take 1 tablet (25 mg total) by mouth 3 (three) times daily as needed for dizziness. 05/14/18   Joyice Sauer, DO   Multiple Vitamins-Minerals (CENTRUM SILVER 50+MEN) TABS Take 1 tablet by mouth daily.    [provider]  ondansetron  (ZOFRAN  ODT) 4 MG disintegrating tablet Take 1 tablet (4 mg total) by mouth every 8 (eight) hours as needed for nausea or vomiting. 05/14/18   Joyice Sauer, DO    Allergies: Penicillins    Review of Systems  Respiratory:  Positive for shortness of breath.   Cardiovascular:  Positive for leg swelling. Negative for chest pain.  Gastrointestinal:  Negative for abdominal distention and abdominal pain.    Updated Vital Signs BP (!) 164/117   Pulse 82   Temp 98 F (36.7 C)   Resp 18   Ht 5' 11 (1.803 m)   Wt 109.3 kg   SpO2 97%   BMI 33.61 kg/m   Physical Exam Vitals and nursing note reviewed.  Constitutional:      Appearance: He is well-developed.  HENT:     Head: Normocephalic and atraumatic.  Cardiovascular:     Rate and Rhythm: Normal rate and regular rhythm.     Heart sounds: Normal heart sounds.  Pulmonary:     Effort: Pulmonary effort is normal. No tachypnea, accessory muscle usage or respiratory distress.     Breath sounds: Examination of the right-lower field reveals decreased breath sounds. Examination of the left-lower field reveals decreased breath sounds. Decreased breath sounds present.  Abdominal:  Palpations: Abdomen is soft.     Tenderness: There is no abdominal tenderness.  Musculoskeletal:     Right lower leg: Edema present.     Left lower leg: Edema present.  Skin:    General: Skin is warm and dry.  Neurological:     Mental Status: He is alert.     (all labs ordered are listed, but only abnormal results are displayed) Labs Reviewed  BASIC METABOLIC PANEL WITH GFR - Abnormal; Notable for the following components:      Result Value   CO2 21 (*)    Glucose, Bld 131 (*)    BUN 29 (*)    Creatinine, Ser 1.36 (*)    GFR, Estimated 57 (*)    All other components within normal limits  CBC - Abnormal; Notable for the  following components:   RBC 4.09 (*)    Hemoglobin 12.1 (*)    HCT 37.7 (*)    All other components within normal limits  BRAIN NATRIURETIC PEPTIDE - Abnormal; Notable for the following components:   B Natriuretic Peptide 1,746.1 (*)    All other components within normal limits  TROPONIN I (HIGH SENSITIVITY) - Abnormal; Notable for the following components:   Troponin I (High Sensitivity) 48 (*)    All other components within normal limits  TROPONIN I (HIGH SENSITIVITY) - Abnormal; Notable for the following components:   Troponin I (High Sensitivity) 44 (*)    All other components within normal limits  HEPATIC FUNCTION PANEL    EKG: EKG Interpretation Date/Time:  Tuesday June 29 2024 18:42:42 EDT Ventricular Rate:  88 PR Interval:  160 QRS Duration:  194 QT Interval:  448 QTC Calculation: 543 R Axis:   251  Text Interpretation: Sinus rhythm Probable left atrial enlargement RBBB and LAFB LVH by voltage similar to earlier in the day Confirmed by Freddi Hamilton [phone]) on 06/29/2024 6:52:34 PM  Radiology: [facility] Chest 2 View Result Date: 06/29/2024 EXAM: 2 VIEW(S) XRAY OF THE CHEST 06/29/2024 02:47:00 PM COMPARISON: 04/02/2024 CLINICAL HISTORY: SOB. Triage notes:Pt BIB EMS from [facility], pt having exertional SOB, worse when laying but ok when sitting up x 2 weeks. No chest pain. EKG at doctors office showed new right bundle branch block. 324 ASA 500 NS given. FINDINGS: LUNGS AND PLEURA: Mild central pulmonary vascular congestion without overt pulmonary edema. No focal pulmonary opacity. No pleural effusion. No pneumothorax. HEART AND MEDIASTINUM: No acute abnormality of the cardiac and mediastinal silhouettes. Aortic atherosclerosis. BONES AND SOFT TISSUES: Right shoulder arthroplasty noted. No acute osseous abnormality. IMPRESSION: 1. Mild central pulmonary vascular congestion without overt pulmonary edema. Electronically signed by: Donnice Mania MD 06/29/2024 03:08 PM EDT RP  Workstation: [id number]     Procedures   Medications Ordered in the ED  furosemide  (LASIX ) injection 20 mg (20 mg Intravenous Given 06/29/24 1955)                                    Medical Decision Making Amount and/or Complexity of Data Reviewed Labs: ordered.    Details: BNP 1700.  Troponins mildly elevated but flat. Radiology: ordered and independent interpretation performed.    Details: No pleural effusions ECG/medicine tests: ordered and independent interpretation performed.    Details: Right bundle branch block  Risk Prescription drug management. Decision regarding hospitalization.   Patient presents with what seems like new onset heart failure.  No distress or hypoxia.  However he will need echo and I have started diuresis with some IV furosemide .  He otherwise is not having any chest pain, very low suspicion for ACS as the cause of his symptoms.  Will need admission, discussed with Dr. Alfornia.     Final diagnoses:  New onset of congestive heart failure [facility])    ED Discharge Orders     None          Freddi Hamilton, MD 06/29/24 2014

## 2024-06-29 NOTE — H&P (Signed)
 History and Physical    Jonathan Fowler FMW:[id number] DOB: August 21, 1956 DOA: 06/29/2024  PCP: Regino Slater, MD  Patient coming from: Home  Chief Complaint: Shortness of breath  HPI: Jonathan Fowler is a 68 y.o. male with medical history significant of hypertension, hyperlipidemia, type 2 diabetes, vertigo, knee replacement surgery in July 2025 presented to ED from PCPs office for evaluation of dyspnea, orthopnea, and bilateral lower extremity edema.  EKG done at their office showed new right bundle blanch block.  Patient was given aspirin  324 mg and 500 mL IV fluids by EMS.  Vital signs on arrival to the ED: Temperature 97.7 F, pulse 86, respiratory rate 18, blood pressure 165/114, and SpO2 100% on room air.  Labs showing no leukocytosis, hemoglobin 12.1 with MCV 92.2 (hemoglobin previously 14.7 on labs 2 years ago), BUN 29, creatinine 1.36 (previously 1.0 on labs 2 years ago), BNP 1746, troponin 48> 44.  Chest x-ray showing mild central pulmonary vascular congestion without overt pulmonary edema.  EKG showing sinus rhythm, LAFB, RBBB, LVH, QTc 546.  RBBB is new and QT interval increased compared to last EKG in the chart from 6 years ago.  Patient was given IV Lasix  20 mg.  TRH called to admit.   Patient is reporting 1 week history of shortness of breath, orthopnea, cough, and bilateral lower extremity edema.  He is not aware of having history of heart failure but does report experiencing similar symptoms a few months ago which resolved on their own so he did not seek medical attention at that time.  Denies chest pain.  He reports history of left knee replacement surgery in July 2025.  Denies hematemesis, hematochezia, melena, hematuria, or any other bleeding.  No other complaints.  Denies nausea, vomiting, abdominal pain, or diarrhea.  Review of Systems:  Review of Systems  All other systems reviewed and are negative.   Past Medical History:  Diagnosis Date   High cholesterol     Hypertension    Vertigo     No past surgical history on file.   reports that he has never smoked. He has never used smokeless tobacco. He reports current alcohol use. He reports that he does not use drugs.  Allergies  Allergen Reactions   Penicillins Nausea Only and Rash    Has patient had a PCN reaction causing immediate rash, facial/tongue/throat swelling, SOB or lightheadedness with hypotension: No Has patient had a PCN reaction causing severe rash involving mucus membranes or skin necrosis: No Has patient had a PCN reaction that required hospitalization: No Has patient had a PCN reaction occurring within the last 10 years: No If all of the above answers are NO, then may proceed with Cephalosporin use.    No family history on file.  Prior to Admission medications   Medication Sig Start Date End Date Taking? Authorizing Provider  aspirin  81 MG tablet Take 81 mg by mouth every evening.     [provider]  atorvastatin  (LIPITOR) 10 MG tablet Take 10 mg by mouth daily. 05/07/18   [provider]  azithromycin  (ZITHROMAX ) 250 MG tablet Take 1 tablet (250 mg total) by mouth daily. Take first 2 tablets together, then 1 every day until finished. 11/03/23   Donnajean Lynwood DEL, PA-C  carvedilol  (COREG ) 3.125 MG tablet Take 3.125 mg by mouth 2 (two) times daily with a meal.    [provider]  eplerenone (INSPRA) 50 MG tablet Take 50 mg by mouth 2 (two) times daily. 05/07/18  [provider]  glucosamine-chondroitin 500-400 MG tablet Take 1 tablet by mouth 3 (three) times daily.    [provider]  hydrALAZINE  (APRESOLINE ) 50 MG tablet Take 50 mg by mouth 3 (three) times daily.    [provider]  irbesartan  (AVAPRO ) 300 MG tablet Take 300 mg by mouth daily.    [provider]  meclizine  (ANTIVERT ) 25 MG tablet Take 1 tablet (25 mg total) by mouth 3 (three) times daily as needed for dizziness. 05/14/18   Joyice Sauer, DO   Multiple Vitamins-Minerals (CENTRUM SILVER 50+MEN) TABS Take 1 tablet by mouth daily.    [provider]  ondansetron  (ZOFRAN  ODT) 4 MG disintegrating tablet Take 1 tablet (4 mg total) by mouth every 8 (eight) hours as needed for nausea or vomiting. 05/14/18   Joyice Sauer, DO    Physical Exam: Vitals:   06/29/24 1339 06/29/24 1340 06/29/24 1724 06/29/24 2154  BP: (!) 165/114  (!) 164/117   Pulse: 86  82   Resp: 18  18   Temp: 97.7 F (36.5 C)  98 F (36.7 C) 98.2 F (36.8 C)  TempSrc: Oral   Oral  SpO2: 100%  97%   Weight:  109.3 kg    Height:  5' 11 (1.803 m)      Physical Exam Vitals reviewed.  Constitutional:      General: He is not in acute distress. HENT:     Head: Normocephalic and atraumatic.  Eyes:     Extraocular Movements: Extraocular movements intact.  Cardiovascular:     Rate and Rhythm: Normal rate and regular rhythm.     Pulses: Normal pulses.  Pulmonary:     Effort: Pulmonary effort is normal. No respiratory distress.     Breath sounds: No stridor. No wheezing, rhonchi or rales.  Abdominal:     General: Bowel sounds are normal. There is no distension.     Palpations: Abdomen is soft.     Tenderness: There is no abdominal tenderness. There is no guarding.  Musculoskeletal:     Cervical back: Normal range of motion.     Right lower leg: Edema present.     Left lower leg: Edema present.     Comments: 3+ pitting edema of bilateral lower extremities  Skin:    General: Skin is warm and dry.  Neurological:     General: No focal deficit present.     Mental Status: He is alert and oriented to person, place, and time.     Labs on Admission: I have personally reviewed following labs and imaging studies  CBC: Recent Labs  Lab 06/29/24 1412  WBC 8.3  HGB 12.1*  HCT 37.7*  MCV 92.2  PLT 230   Basic Metabolic Panel: Recent Labs  Lab 06/29/24 1412  NA 138  K 3.8  CL 105  CO2 21*  GLUCOSE 131*  BUN 29*  CREATININE 1.36*  CALCIUM   9.0   GFR: Estimated Creatinine Clearance: 65.4 mL/min (A) (by C-G formula based on SCr of 1.36 mg/dL (H)). Liver Function Tests: Recent Labs  Lab 06/29/24 1412  AST 29  ALT 27  ALKPHOS 67  BILITOT 0.5  PROT 6.5  ALBUMIN 3.7   No results for input(s): LIPASE, AMYLASE in the last 168 hours. No results for input(s): AMMONIA in the last 168 hours. Coagulation Profile: No results for input(s): INR, PROTIME in the last 168 hours. Cardiac Enzymes: No results for input(s): CKTOTAL, CKMB, CKMBINDEX, TROPONINI in the last 168 hours. BNP (  last 3 results) No results for input(s): PROBNP in the last 8760 hours. HbA1C: No results for input(s): HGBA1C in the last 72 hours. CBG: No results for input(s): GLUCAP in the last 168 hours. Lipid Profile: No results for input(s): CHOL, HDL, LDLCALC, TRIG, CHOLHDL, LDLDIRECT in the last 72 hours. Thyroid  Function Tests: No results for input(s): TSH, T4TOTAL, FREET4, T3FREE, THYROIDAB in the last 72 hours. Anemia Panel: No results for input(s): VITAMINB12, FOLATE, FERRITIN, TIBC, IRON , RETICCTPCT in the last 72 hours. Urine analysis:    Component Value Date/Time   COLORURINE YELLOW 06/13/2022 2246   APPEARANCEUR CLEAR 06/13/2022 2246   LABSPEC 1.012 06/13/2022 2246   PHURINE 5.0 06/13/2022 2246   GLUCOSEU NEGATIVE 06/13/2022 2246   HGBUR SMALL (A) 06/13/2022 2246   BILIRUBINUR NEGATIVE 06/13/2022 2246   KETONESUR NEGATIVE 06/13/2022 2246   PROTEINUR 100 (A) 06/13/2022 2246   UROBILINOGEN 0.2 03/28/2013 1507   NITRITE NEGATIVE 06/13/2022 2246   LEUKOCYTESUR NEGATIVE 06/13/2022 2246    Radiological Exams on Admission: DG Chest 2 View Result Date: 06/29/2024 EXAM: 2 VIEW(S) XRAY OF THE CHEST 06/29/2024 02:47:00 PM COMPARISON: 04/02/2024 CLINICAL HISTORY: SOB. Triage notes:Pt BIB EMS from [facility], pt having exertional SOB, worse when laying but ok when sitting up x 2 weeks. No  chest pain. EKG at doctors office showed new right bundle branch block. 324 ASA 500 NS given. FINDINGS: LUNGS AND PLEURA: Mild central pulmonary vascular congestion without overt pulmonary edema. No focal pulmonary opacity. No pleural effusion. No pneumothorax. HEART AND MEDIASTINUM: No acute abnormality of the cardiac and mediastinal silhouettes. Aortic atherosclerosis. BONES AND SOFT TISSUES: Right shoulder arthroplasty noted. No acute osseous abnormality. IMPRESSION: 1. Mild central pulmonary vascular congestion without overt pulmonary edema. Electronically signed by: Donnice Mania MD 06/29/2024 03:08 PM EDT RP Workstation: [id number]    Assessment and Plan  Acute CHF Patient with no documented history of CHF or previous echo results in the chart presenting with 1 week history of dyspnea, orthopnea, cough, and bilateral lower extremity edema.  Appears volume overloaded on exam.  BNP 1746.  Chest x-ray showing mild central pulmonary vascular congestion without overt pulmonary edema.  Not hypoxic. -Cardiac monitoring -Patient was given IV Lasix  20 mg in the ED.  Continue diuresis with IV Lasix  20 mg twice daily starting in the morning. -Monitor intake and output, daily weights -Low-sodium diet with fluid restriction -Monitor renal function and electrolytes -Echocardiogram ordered  Mild AKI Likely cardiorenal in etiology.  Continue IV Lasix  for diuresis and monitor renal function.  Hold home irbesartan  and eplerenone at this time.  New RBBB Mild troponin elevation Troponin mildly elevated but stable, pattern not consistent with ACS.  Patient is not endorsing chest pain.  Echo ordered as above.  QT prolongation Monitor potassium and magnesium  levels, replace as needed.  Avoid QT prolonging drugs.  Type 2 diabetes Patient states he takes metformin at home which has been held at this time.  Check A1c and placed on sensitive sliding scale insulin  ACHS.  Hypertension Slightly hypertensive.   Continue home Coreg  3.125 mg twice daily and hydralazine  100 mg twice daily.  Continue IV Lasix  for diuresis as above.  Hyperlipidemia Continue atorvastatin  10 mg daily.  Mild normocytic anemia No signs or symptoms of bleeding.  Monitor labs.  Home medications initiated per patient's reported history.  Awaiting pharmacy med rec for verification.   DVT prophylaxis: Lovenox  Code Status: Full Code (discussed with the patient) Family Communication: No family at bedside.  Diagnostic findings and  treatment plan discussed with the patient. Level of care: Telemetry bed Admission status: It is my clinical opinion that admission to INPATIENT is reasonable and necessary because of the expectation that this patient will require hospital care that crosses at least 2 midnights to treat this condition based on the medical complexity of the problems presented.  Given the aforementioned information, the predictability of an adverse outcome is felt to be significant.  Editha Ram MD [facility]  If 7PM-7AM, please contact night-coverage [email or web address]  06/29/2024, 10:07 PM

## 2024-06-29 NOTE — ED Notes (Signed)
 Pt had knee replacement 05/07/24

## 2024-06-29 NOTE — ED Provider Triage Note (Signed)
 Emergency Medicine Provider Triage Evaluation Note  Jonathan Fowler , a 68 y.o. male  was evaluated in triage.  Pt complains of 3 days of bilateral lower extremity swelling and orthopnea.  Sent by urgent care for abnormal EKG.  He is denying chest pain, cough or chest tightness  Review of Systems  Positive: Shortness of breath Negative: Chest pain  Physical Exam  BP (!) 165/114 (BP Location: Right Arm)   Pulse 86   Temp 97.7 F (36.5 C) (Oral)   Resp 18   Ht 5' 11 (1.803 m)   Wt 109.3 kg   SpO2 100%   BMI 33.61 kg/m  Gen:   Awake, no distress   Resp:  Normal effort  MSK:   Moves extremities without difficulty  Other:  Bilateral pitting edema.  Medical Decision Making  Medically screening exam initiated at 3:11 PM.  Appropriate orders placed.  [place] CHRISTELLA [place] was informed that the remainder of the evaluation will be completed by another provider, this initial triage assessment does not replace that evaluation, and the importance of remaining in the ED until their evaluation is complete.  EKG reviewed by attending, no significant change since prior tracing. Will check BNP, troponin and Cxr    Shermon Warren SAILOR, PA-C 06/29/24 1512

## 2024-06-29 NOTE — ED Notes (Signed)
Lab called to add on additional labs.  

## 2024-06-30 ENCOUNTER — Inpatient Hospital Stay (HOSPITAL_COMMUNITY)

## 2024-06-30 ENCOUNTER — Encounter (HOSPITAL_COMMUNITY): Payer: Self-pay | Admitting: Internal Medicine

## 2024-06-30 DIAGNOSIS — E1169 Type 2 diabetes mellitus with other specified complication: Secondary | ICD-10-CM | POA: Diagnosis not present

## 2024-06-30 DIAGNOSIS — N1831 Chronic kidney disease, stage 3a: Secondary | ICD-10-CM | POA: Diagnosis not present

## 2024-06-30 DIAGNOSIS — I509 Heart failure, unspecified: Secondary | ICD-10-CM | POA: Diagnosis not present

## 2024-06-30 DIAGNOSIS — I5021 Acute systolic (congestive) heart failure: Secondary | ICD-10-CM

## 2024-06-30 DIAGNOSIS — I10 Essential (primary) hypertension: Secondary | ICD-10-CM

## 2024-06-30 DIAGNOSIS — E66811 Obesity, class 1: Secondary | ICD-10-CM

## 2024-06-30 DIAGNOSIS — I5023 Acute on chronic systolic (congestive) heart failure: Secondary | ICD-10-CM | POA: Diagnosis not present

## 2024-06-30 DIAGNOSIS — E785 Hyperlipidemia, unspecified: Secondary | ICD-10-CM

## 2024-06-30 DIAGNOSIS — R9431 Abnormal electrocardiogram [ECG] [EKG]: Secondary | ICD-10-CM | POA: Diagnosis not present

## 2024-06-30 LAB — BASIC METABOLIC PANEL WITH GFR
Anion gap: 14 (ref 5–15)
BUN: 26 mg/dL — ABNORMAL HIGH (ref 8–23)
CO2: 22 mmol/L (ref 22–32)
Calcium: 8.8 mg/dL — ABNORMAL LOW (ref 8.9–10.3)
Chloride: 103 mmol/L (ref 98–111)
Creatinine, Ser: 1.35 mg/dL — ABNORMAL HIGH (ref 0.61–1.24)
GFR, Estimated: 57 mL/min — ABNORMAL LOW (ref >60)
Glucose, Bld: 121 mg/dL — ABNORMAL HIGH (ref 70–99)
Potassium: 3.1 mmol/L — ABNORMAL LOW (ref 3.5–5.1)
Sodium: 139 mmol/L (ref 135–145)

## 2024-06-30 LAB — CBC
HCT: 36.9 % — ABNORMAL LOW (ref 39.0–52.0)
Hemoglobin: 11.6 g/dL — ABNORMAL LOW (ref 13.0–17.0)
MCH: 29.6 pg (ref 26.0–34.0)
MCHC: 31.4 g/dL (ref 30.0–36.0)
MCV: 94.1 fL (ref 80.0–100.0)
Platelets: 180 K/uL (ref 150–400)
RBC: 3.92 MIL/uL — ABNORMAL LOW (ref 4.22–5.81)
RDW: 14.2 % (ref 11.5–15.5)
WBC: 6.5 K/uL (ref 4.0–10.5)
nRBC: 0 % (ref 0.0–0.2)

## 2024-06-30 LAB — CBG MONITORING, ED
Glucose-Capillary: 150 mg/dL — ABNORMAL HIGH (ref 70–99)
Glucose-Capillary: 107 mg/dL — ABNORMAL HIGH (ref 70–99)

## 2024-06-30 LAB — GLUCOSE, CAPILLARY
Glucose-Capillary: 181 mg/dL — ABNORMAL HIGH (ref 70–99)
Glucose-Capillary: 102 mg/dL — ABNORMAL HIGH (ref 70–99)

## 2024-06-30 LAB — ECHOCARDIOGRAM COMPLETE
AR max vel: 3.11 cm2
AV Area VTI: 2.64 cm2
AV Area mean vel: 2.66 cm2
AV Mean grad: 3 mmHg
AV Peak grad: 5 mmHg
Ao pk vel: 1.12 m/s
Area-P 1/2: 5.27 cm2
Calc EF: 17.1 %
Est EF: <20
Height: 71 in
S' Lateral: 6.4 cm
Single Plane A2C EF: 19.2 %
Single Plane A4C EF: 19.4 %
Weight: 3856 [oz_av]

## 2024-06-30 LAB — HIV ANTIBODY (ROUTINE TESTING W REFLEX): HIV Screen 4th Generation wRfx: NONREACTIVE

## 2024-06-30 LAB — SURGICAL PCR SCREEN
MRSA, PCR: NEGATIVE
Staphylococcus aureus: NEGATIVE

## 2024-06-30 LAB — HEMOGLOBIN A1C
Hgb A1c MFr Bld: 5.5 % (ref 4.8–5.6)
Mean Plasma Glucose: 111.15 mg/dL

## 2024-06-30 LAB — MAGNESIUM: Magnesium: 1.6 mg/dL — ABNORMAL LOW (ref 1.7–2.4)

## 2024-06-30 MED ORDER — EMPAGLIFLOZIN 10 MG PO TABS
10.0000 mg | ORAL_TABLET | Freq: Every day | ORAL | Status: DC
  Administered 2024-06-30 – 2024-07-07 (×8): 10 mg via ORAL
  Filled 2024-06-30 (×8): qty 1

## 2024-06-30 MED ORDER — MAGNESIUM SULFATE 4 GM/100ML IV SOLN
4.0000 g | Freq: Once | INTRAVENOUS | Status: AC
  Administered 2024-06-30: 4 g via INTRAVENOUS
  Filled 2024-06-30: qty 100

## 2024-06-30 MED ORDER — FUROSEMIDE 10 MG/ML IJ SOLN
80.0000 mg | Freq: Two times a day (BID) | INTRAMUSCULAR | Status: DC
  Administered 2024-07-01: 80 mg via INTRAVENOUS
  Filled 2024-06-30: qty 8

## 2024-06-30 MED ORDER — POTASSIUM CHLORIDE CRYS ER 20 MEQ PO TBCR
40.0000 meq | EXTENDED_RELEASE_TABLET | ORAL | Status: AC
  Administered 2024-06-30 (×2): 40 meq via ORAL
  Filled 2024-06-30 (×2): qty 2

## 2024-06-30 MED ORDER — ISOSORB DINITRATE-HYDRALAZINE 20-37.5 MG PO TABS
1.0000 | ORAL_TABLET | Freq: Three times a day (TID) | ORAL | Status: DC
  Administered 2024-06-30 – 2024-07-02 (×6): 1 via ORAL
  Filled 2024-06-30 (×6): qty 1

## 2024-06-30 MED ORDER — FUROSEMIDE 10 MG/ML IJ SOLN
60.0000 mg | Freq: Two times a day (BID) | INTRAMUSCULAR | Status: DC
Start: 2024-06-30 — End: 2024-06-30
  Administered 2024-06-30: 60 mg via INTRAVENOUS
  Filled 2024-06-30: qty 6

## 2024-06-30 MED ORDER — MELATONIN 3 MG PO TABS
3.0000 mg | ORAL_TABLET | Freq: Every evening | ORAL | Status: DC | PRN
  Administered 2024-06-30 – 2024-07-01 (×2): 3 mg via ORAL
  Filled 2024-06-30 (×2): qty 1

## 2024-06-30 MED ORDER — PERFLUTREN LIPID MICROSPHERE
1.0000 mL | INTRAVENOUS | Status: AC | PRN
  Administered 2024-06-30: 2 mL via INTRAVENOUS

## 2024-06-30 MED ORDER — IRBESARTAN 150 MG PO TABS
300.0000 mg | ORAL_TABLET | Freq: Every day | ORAL | Status: DC
  Administered 2024-06-30 – 2024-07-04 (×5): 300 mg via ORAL
  Filled 2024-06-30 (×5): qty 2
  Filled 2024-06-30: qty 1

## 2024-06-30 MED ORDER — SPIRONOLACTONE 25 MG PO TABS
25.0000 mg | ORAL_TABLET | Freq: Every day | ORAL | Status: DC
  Administered 2024-06-30 – 2024-07-01 (×2): 25 mg via ORAL
  Filled 2024-06-30 (×2): qty 1

## 2024-06-30 NOTE — Assessment & Plan Note (Addendum)
 Fasting glucose today is 98  Glucose has been stable, will hold on further insulin  and will check capillary glucose as needed.  Hgb A1c is 5.5   Continue statin therapy

## 2024-06-30 NOTE — Progress Notes (Signed)
 Progress Note   Patient: Jonathan Fowler FMW:[id number] DOB: Aug 07, 1956 DOA: 06/29/2024     1 DOS: the patient was seen and examined on 06/30/2024   Brief hospital course: Mr. Surrette was admitted to the hospital with the working diagnosis of heart failure decompensation.   68 yo male with past medical history of hypertension, hyperlipidemia, and T2DM who presented with dyspnea. Reported one week of worsening dyspnea, orthopnea, PND and lower extremity edema. On the day of admission he was evaluated by his primary care provider, found him volume overloaded and referred him to the hospital. On his initial physical examination his blood pressure was 165/114, HR 86, RR 18 and 02 saturation 97% Lungs with bilateral rales with no wheezing or rhonchi, heart with S1 and S2 present and regular with no gallops or murmurs, abdomen with no distention, positive lower extremity edema pitting +++.   Na 138, K 3,8 Cl 105 bicarbonate 21, glucose 131, bun 29 cr 1.36  AST 29 ALT 27  BNP 1,746 High sensitive troponin 48 and 44  Wbc 8,3 hgb 12.1 plt 230   Chest radiograph with mild cardiomegaly, with bilateral hilar vascular congestion, with no effusions or infiltrates.   EKG 91 bpm, left axis deviation, left anterior fascicular block, right bundle branch block, qtc 546, sinus rhythm, with J point elevation II, III, aVF, V4 to V6, positive LVH, with no significant ST segment or T wave changes.   Assessment and Plan: * Acute CHF (congestive heart failure) (HCC) Pending echocardiogram. Continue with significant volume overload.   Plan to increase furosemide  to 60 mg IV bid Resume mineralocorticoid receptor blocker and ARB Add SGLT 2 inh to augment diuresis.  Hold on carvedilol  until result from echocardiogram.  Possible transition to Enetresto during this hospitalization.   Essential hypertension Uncontrolled hypertension.   Will hold on carvedilol , resume ARB and spironolactone  Hold on  hydralazine , possible transition to entresto, Continue aggressive diuresis with IV furosemide .   Chronic kidney disease, stage 3a (HCC) Suspect CKD  Hypokalemia. Hypomagnesemia   Plan to continue diuretics with furosemide , spironolactone  and SGLT 2 inh Add Kcl 40 meq x2 and 4 g mag sulfate.  Follow up renal function and electrolytes in am.   Type 2 diabetes mellitus with hyperlipidemia (HCC) Continue insulin  sliding scale for glucose cover and monitoring.  Continue statin therapy   Obesity, class 1 Calculated BMI is 33.6       Subjective: Patient continue to have dyspnea, orthopnea and lower extremity edema, no chest pain.   Physical Exam: Vitals:   06/30/24 0809 06/30/24 0821 06/30/24 1000 06/30/24 1029  BP: (!) 166/99 (!) 153/104 (!) 150/110 (!) 150/110  Pulse: 86 81 81   Resp: 20  (!) 28   Temp: 97.9 F (36.6 C)     TempSrc: Oral     SpO2: 99%  100%   Weight:      Height:       BP (!) 150/110   Pulse 81   Temp 97.9 F (36.6 C) (Oral)   Resp (!) 28   Ht 5' 11 (1.803 m)   Wt 109.3 kg   SpO2 100%   BMI 33.61 kg/m   Neurology awake and alert ENT with no pallor Cardiovascular with S1 and S2 present, with no gallops or rubs.  Positive JVD Respiratory with positive rales bilaterally with no wheezing or rhonchi  Abdomen with no distention  Positive lower extremity edema +++   Data Reviewed:    Family Communication: I spoke  with patient's wife at the bedside, we talked in detail about patient's condition, plan of care and prognosis and all questions were addressed.   Disposition: Status is: Inpatient Remains inpatient appropriate because: IV diuresis   Planned Discharge Destination: Home     Author: Elidia Toribio Furnace, MD 06/30/2024 1:06 PM  For on call review [email or web address].

## 2024-06-30 NOTE — Progress Notes (Signed)
 Heart Failure Navigator Progress Note  Assessed for Heart & Vascular TOC clinic readiness.  Patient does not meet criteria due to Advanced Heart Failure Team consulted and will see patient..   Navigator will sign off at this time.   Stephane Haddock, BSN, Scientist, clinical (histocompatibility and immunogenetics) Only

## 2024-06-30 NOTE — Plan of Care (Signed)

## 2024-06-30 NOTE — Consult Note (Addendum)
 Advanced Heart Failure Team Consult Note   Primary Physician: Regino Slater, MD Cardiologist:  None  Reason for Consultation: Acute HFrEF  HPI:    Jonathan Fowler is seen today for evaluation of acute HFrEF at the request of Dr. Noralee, hospital medicine.   Jonathan Fowler is a 68 y.o. male with HTN, HLD, DMII, osteoarthritis and vertigo.   He presented to the ED with 1 week of worsening SOB, edema and orthopnea. Reports similar symptoms 3 months ago, PCP gave PRN lasix  which resolved symptoms. No family history of heart disease, dad passed away from sepsis and mom passed away from brain tumor. Does not report ETOH, drug or tobacco use. Reports full compliance with medications at home. He checks his BP sporadically and SBP usually in the 130s. No chest pain. Denies sick contacts. Denies palpitations.   Admission labs reviewed: K 3.8, SCr 1.36, BNP 1.7K, HsTrop 48>44, CXR with pulmonary congestion, EKG with NSR,  possible LAE, RBBB, LVH. Echo showed EF <20%, LV with GHK, LV severely dilated, GIIDD, RV mod reduced, mod elevated PASP, LA/RA severely dilated, mild MR. AHF team asked to see.   Resting comfortably in bed. Denies CP. SOB improving.   Home Medications Prior to Admission medications   Medication Sig Start Date End Date Taking? Authorizing Provider  atorvastatin  (LIPITOR) 10 MG tablet Take 10 mg by mouth daily. 05/07/18  Yes [provider]  carvedilol  (COREG ) 3.125 MG tablet Take 3.125 mg by mouth 2 (two) times daily with a meal.   Yes [provider]  doxycycline (VIBRA-TABS) 100 MG tablet Take 100 mg by mouth 2 (two) times daily. 06/11/24  Yes [provider]  eplerenone (INSPRA) 50 MG tablet Take 100 mg by mouth 2 (two) times daily. 05/07/18  Yes [provider]  glucosamine-chondroitin 500-400 MG tablet Take 1 tablet by mouth in the morning and at bedtime.   Yes [provider]  hydrALAZINE  (APRESOLINE ) 100 MG tablet Take  100 mg by mouth in the morning and at bedtime.   Yes [provider]  irbesartan  (AVAPRO ) 300 MG tablet Take 300 mg by mouth daily.   Yes [provider]  metFORMIN (GLUCOPHAGE) 500 MG tablet Take 500 mg by mouth in the morning and at bedtime. 07/21/23  Yes [provider]  methocarbamol (ROBAXIN) 500 MG tablet Take 500 mg by mouth at bedtime as needed for muscle spasms (sleep). 06/17/24  Yes [provider]  Multiple Vitamins-Minerals (CENTRUM SILVER 50+MEN) TABS Take 1 tablet by mouth daily.   Yes [provider]  furosemide  (LASIX ) 20 MG tablet Take 20 mg by mouth daily as needed for edema. Patient not taking: Reported on 06/30/2024    [provider]   Past Medical History: Past Medical History:  Diagnosis Date   High cholesterol    Hypertension    Vertigo    Past Surgical History: No past surgical history on file.  Family History: No family history on file.  Social History: Social History   Socioeconomic History   Marital status: Married    Spouse name: Not on file   Number of children: Not on file   Years of education: Not on file   Highest education level: Not on file  Occupational History   Not on file  Tobacco Use   Smoking status: Never   Smokeless tobacco: Never  Substance and Sexual Activity   Alcohol use: Yes    Comment: socially   Drug use: No  Sexual activity: Not on file  Other Topics Concern   Not on file  Social History Narrative   Not on file   Social Drivers of Health   Financial Resource Strain: Not on file  Food Insecurity: Not on file  Transportation Needs: Not on file  Physical Activity: Not on file  Stress: Not on file  Social Connections: Unknown (02/25/2022)   Received [facility]   Social Network    Social Network: Not on file   Allergies:  Allergies  Allergen Reactions   Loratadine     Headache    Lisinopril Cough   Mobic [Meloxicam] Nausea And Vomiting   Penicillins  Nausea Only and Rash    Has patient had a PCN reaction causing immediate rash, facial/tongue/throat swelling, SOB or lightheadedness with hypotension: No Has patient had a PCN reaction causing severe rash involving mucus membranes or skin necrosis: No Has patient had a PCN reaction that required hospitalization: No Has patient had a PCN reaction occurring within the last 10 years: No If all of the above answers are NO, then may proceed with Cephalosporin use.   Objective:    Vital Signs:   Temp:  [97.7 F (36.5 C)-98.2 F (36.8 C)] 97.9 F (36.6 C) (09/17 1428) Pulse Rate:  [68-86] 82 (09/17 1428) Resp:  [16-28] 16 (09/17 1428) BP: (122-166)/(80-117) 147/85 (09/17 1428) SpO2:  [87 %-100 %] 98 % (09/17 1428) Weight:  [109.6 kg] 109.6 kg (09/17 1428) Last BM Date : 11/16/23  Weight change: Filed Weights   06/29/24 1340 06/30/24 1428  Weight: 109.3 kg 109.6 kg   Intake/Output:  No intake or output data in the 24 hours ending 06/30/24 1558   Physical Exam  General:  well appearing.  No respiratory difficulty Neck: JVD to ear.  Cor: Regular rate & rhythm. No murmurs. Lungs: clear Extremities: +2-3 BLE edema to hip Neuro: alert & oriented x 3. Affect pleasant.  Telemetry   NSR 70s (Personally reviewed)    EKG    NSR 80s, possible LAE, RBBB, LVH  Labs   Basic Metabolic Panel: Recent Labs  Lab 06/29/24 1412 06/30/24 0106  NA 138 139  K 3.8 3.1*  CL 105 103  CO2 21* 22  GLUCOSE 131* 121*  BUN 29* 26*  CREATININE 1.36* 1.35*  CALCIUM  9.0 8.8*  MG  --  1.6*    Liver Function Tests: Recent Labs  Lab 06/29/24 1412  AST 29  ALT 27  ALKPHOS 67  BILITOT 0.5  PROT 6.5  ALBUMIN 3.7   No results for input(s): LIPASE, AMYLASE in the last 168 hours. No results for input(s): AMMONIA in the last 168 hours.  CBC: Recent Labs  Lab 06/29/24 1412 06/30/24 0106  WBC 8.3 6.5  HGB 12.1* 11.6*  HCT 37.7* 36.9*  MCV 92.2 94.1  PLT 230 180    Cardiac  Enzymes: No results for input(s): CKTOTAL, CKMB, CKMBINDEX, TROPONINI in the last 168 hours.  BNP: BNP (last 3 results) Recent Labs    06/29/24 1412  BNP 1,746.1*    ProBNP (last 3 results) No results for input(s): PROBNP in the last 8760 hours.   CBG: Recent Labs  Lab 06/29/24 2357 06/30/24 0814 06/30/24 1206  GLUCAP 87 150* 107*    Coagulation Studies: No results for input(s): LABPROT, INR in the last 72 hours.   Imaging   ECHOCARDIOGRAM COMPLETE Result Date: 06/30/2024    ECHOCARDIOGRAM REPORT   Patient Name:   Jonathan Fowler Date of  Exam: 06/30/2024 Medical Rec #:  [id number]          Height:       71.0 in Accession #:    [id number]         Weight:       241.0 lb Date of Birth:  05/05/1956          BSA:          2.282 m Patient Age:    68 years           BP:           159/104 mmHg Patient Gender: M                  HR:           73 bpm. Exam Location:  Inpatient Procedure: 2D Echo and Intracardiac Opacification Agent (Both Spectral and Color            Flow Doppler were utilized during procedure). Indications:    CHF  History:        Patient has no prior history of Echocardiogram examinations.                 Risk Factors:Hypertension.  Sonographer:    Charmaine Gaskins Referring Phys: [id number] VASUNDHRA RATHORE IMPRESSIONS  1. Left ventricular ejection fraction, by estimation, is <20%. Left ventricular ejection fraction by 2D MOD biplane is 17.1 %. The left ventricle has severely decreased function. The left ventricle demonstrates global hypokinesis. The left ventricular internal cavity size was severely dilated. Left ventricular diastolic parameters are consistent with Grade II diastolic dysfunction (pseudonormalization). Elevated left ventricular end-diastolic pressure. The average left ventricular global longitudinal strain is -7.8 %. The global longitudinal strain is abnormal.  2. Right ventricular systolic function is moderately reduced. The right ventricular size  is normal. There is moderately elevated pulmonary artery systolic pressure.  3. Left atrial size was severely dilated.  4. Right atrial size was severely dilated.  5. The mitral valve is normal in structure. Mild mitral valve regurgitation. No evidence of mitral stenosis.  6. The aortic valve is tricuspid. Aortic valve regurgitation is not visualized. No aortic stenosis is present.  7. The inferior vena cava is dilated in size with <50% respiratory variability, suggesting right atrial pressure of 15 mmHg. FINDINGS  Left Ventricle: Left ventricular ejection fraction, by estimation, is <20%. Left ventricular ejection fraction by 2D MOD biplane is 17.1 %. The left ventricle has severely decreased function. The left ventricle demonstrates global hypokinesis. Definity  contrast agent was given IV to delineate the left ventricular endocardial borders. The average left ventricular global longitudinal strain is -7.8 %. Strain was performed and the global longitudinal strain is abnormal. The left ventricular internal cavity size was severely dilated. There is no left ventricular hypertrophy. Left ventricular diastolic parameters are consistent with Grade II diastolic dysfunction (pseudonormalization). Elevated left ventricular end-diastolic pressure. Right Ventricle: The right ventricular size is normal. No increase in right ventricular wall thickness. Right ventricular systolic function is moderately reduced. There is moderately elevated pulmonary artery systolic pressure. The tricuspid regurgitant velocity is 2.81 m/s, and with an assumed right atrial pressure of 15 mmHg, the estimated right ventricular systolic pressure is 46.6 mmHg. Left Atrium: Left atrial size was severely dilated. Right Atrium: Right atrial size was severely dilated. Pericardium: There is no evidence of pericardial effusion. Mitral Valve: The mitral valve is normal in structure. Mild mitral valve regurgitation. No evidence of mitral valve stenosis.  Tricuspid Valve: The tricuspid  valve is normal in structure. Tricuspid valve regurgitation is mild . No evidence of tricuspid stenosis. Aortic Valve: The aortic valve is tricuspid. Aortic valve regurgitation is not visualized. No aortic stenosis is present. Aortic valve mean gradient measures 3.0 mmHg. Aortic valve peak gradient measures 5.0 mmHg. Aortic valve area, by VTI measures 2.64 cm. Pulmonic Valve: The pulmonic valve was normal in structure. Pulmonic valve regurgitation is mild. No evidence of pulmonic stenosis. Aorta: The aortic root is normal in size and structure. Venous: The inferior vena cava is dilated in size with less than 50% respiratory variability, suggesting right atrial pressure of 15 mmHg. IAS/Shunts: No atrial level shunt detected by color flow Doppler.  LEFT VENTRICLE PLAX 2D                        Biplane EF (MOD) LVIDd:         6.90 cm         LV Biplane EF:   Left LVIDs:         6.40 cm                          ventricular LV PW:         1.10 cm                          ejection LV IVS:        1.20 cm                          fraction by LVOT diam:     2.30 cm                          2D MOD LV SV:         54                               biplane is LV SV Index:   24                               17.1 %. LVOT Area:     4.15 cm                                Diastology                                LV e' medial:    3.53 cm/s LV Volumes (MOD)               LV E/e' medial:  27.3 LV vol d, MOD    260.0 ml      LV e' lateral:   8.55 cm/s A2C:                           LV E/e' lateral: 11.3 LV vol d, MOD    253.0 ml A4C:                           2D Longitudinal LV vol s, MOD  210.0 ml      Strain A2C:                           2D Strain GLS   -7.0 % LV vol s, MOD    204.0 ml      (A4C): A4C:                           2D Strain GLS   -7.6 % LV SV MOD A2C:   50.0 ml       (A3C): LV SV MOD A4C:   253.0 ml      2D Strain GLS   -8.6 % LV SV MOD BP:    44.1 ml       (A2C):                                 2D Strain GLS   -7.8 %                                Avg: RIGHT VENTRICLE RV Basal diam:  4.20 cm RV Mid diam:    4.60 cm RV S prime:     10.40 cm/s LEFT ATRIUM              Index        RIGHT ATRIUM           Index LA diam:        5.00 cm  2.19 cm/m   RA Area:     25.90 cm LA Vol (A2C):   117.0 ml 51.26 ml/m  RA Volume:   89.40 ml  39.17 ml/m LA Vol (A4C):   122.0 ml 53.45 ml/m LA Biplane Vol: 120.0 ml 52.57 ml/m  AORTIC VALVE AV Area (Vmax):    3.11 cm AV Area (Vmean):   2.66 cm AV Area (VTI):     2.64 cm AV Vmax:           112.00 cm/s AV Vmean:          86.700 cm/s AV VTI:            0.206 m AV Peak Grad:      5.0 mmHg AV Mean Grad:      3.0 mmHg LVOT Vmax:         83.80 cm/s LVOT Vmean:        55.600 cm/s LVOT VTI:          0.131 m LVOT/AV VTI ratio: 0.64  AORTA Ao Asc diam: 3.80 cm MITRAL VALVE               TRICUSPID VALVE MV Area (PHT): 5.27 cm    TR Peak grad:   31.6 mmHg MV Decel Time: 144 msec    TR Vmax:        281.00 cm/s MV E velocity: 96.40 cm/s MV A velocity: 44.50 cm/s  SHUNTS MV E/A ratio:  2.17        Systemic VTI:  0.13 m                            Systemic Diam: 2.30 cm Annabella Scarce MD Electronically signed by Annabella Scarce MD Signature Date/Time: 06/30/2024/1:05:56 PM    Final  Medications:   Current Medications:  atorvastatin   10 mg Oral Daily   empagliflozin   10 mg Oral Daily   enoxaparin  (LOVENOX ) injection  40 mg Subcutaneous Q24H   furosemide   60 mg Intravenous BID   insulin  aspart  0-5 Units Subcutaneous QHS   insulin  aspart  0-9 Units Subcutaneous TID WC   irbesartan   300 mg Oral Daily   potassium chloride   40 mEq Oral Q4H   spironolactone   25 mg Oral Daily    Infusions:  magnesium  sulfate bolus IVPB 4 g (06/30/24 1359)    Patient Profile   Jonathan Fowler is a 68 y.o. male with HTN, HLD, DMII, osteoarthritis and vertigo. AHF team to see with acute systolic heart failure.   Assessment/Plan  Acute systolic heart failure - Echo showed  EF <20%, LV with GHK, LV severely dilated, GIIDD, RV mod reduced, mod elevated PASP, LA/RA severely dilated, mild MR - NYHA IV on admission - Need to rule out iCM. HTN CM?, RBBB CM? - Volume overloaded on exam. Increase IV lasix  to 80 IV BID - Continue Jardiance  10 mg daily. Denies hx UTIs. A1c 5.5 - Continue spiro 25 mg daily - Continue Irbesartan  300 mg daily. Allergy to lisinopril, would avoid Entresto.  - Hold BB with volume overload (on coreg  at home).  - Place UNNA boots - PICC line? - Will need L/RHC +/- cMRI - Consider echo post diuresis. He would be a candidate for advanced therapies.   Elevated HsTrop - HsTrop 48>44 - Suspect mild elevation from demand ischemia with volume overload. - LDL 6/25 46 - Continue statin - Denies CP - LHC to rule out iCM  RBBB - noted recently on echo - present on EKG  HTN - Meds as above - Start bidil  1 tab TID   HLD - LDL 6/25 46 - Continue statin  Length of Stay: 1  Beckey LITTIE Coe, NP  06/30/2024, 3:58 PM  Advanced Heart Failure Team Pager [phone] (M-F; 7a - 5p)  Please contact [facility] for night-coverage after hours (4p -7a ) and weekends on [email or web address]

## 2024-06-30 NOTE — Assessment & Plan Note (Addendum)
 Continue blood pressure control with irbesartan , spironolactone  and added carvedilol .  Discontinue bidil .

## 2024-06-30 NOTE — Assessment & Plan Note (Addendum)
 Echocardiogram with reduced LV systolic function with EF <20%, global hypokinesis, severe dilatated LV cavity, grade II diastolic dysfunction (pseudo normalization EA), RV systolic function with moderate reduction, LA and RA with severe dilatation, RVSP 46,6 mmHg, mild TR and mild MR.   09/19 cardiac catheterization  RA 9  RV 64/10 PA 58/29 mean 40  PCWP mean 27 Cardiac output 5,76 L/min and index 2.5 L/min/m2(Fick) Cardiac output 4.46 L/min and index 2 L/min/m2 (thermodilution)  PVR 2.25 to 2.9 Wood Units  Elevated filling pressures, with pre and post capillary pulmonary hypertension.   Cardiac MRI  Reduced LV systolic function 16%, global hypokinesis, RV with severe dilatation with EF 29%, non ischemic cardiomyopathy.   Urine output 5,200 ml Systolic blood pressure 140  mmHg.   Plan to continue diuresis with furosemide  80 mg IV bid SGLT 2 inh, spironolactone  and afterload reduction with irbesartan , Bidil .  Pending coronary angiography when renal function more stable.

## 2024-06-30 NOTE — Hospital Course (Addendum)
 [name] was admitted to the hospital with the working diagnosis of heart failure decompensation.   68 yo male with past medical history of hypertension, hyperlipidemia, and T2DM who presented with dyspnea. Reported one week of worsening dyspnea, orthopnea, PND and lower extremity edema. On the day of admission he was evaluated by his primary care provider, found him volume overloaded and referred him to the hospital. On his initial physical examination his blood pressure was 165/114, HR 86, RR 18 and 02 saturation 97% Lungs with bilateral rales with no wheezing or rhonchi, heart with S1 and S2 present and regular with no gallops or murmurs, abdomen with no distention, positive lower extremity edema pitting +++.   Na 138, K 3,8 Cl 105 bicarbonate 21, glucose 131, bun 29 cr 1.36  AST 29 ALT 27  BNP 1,746 High sensitive troponin 48 and 44  Wbc 8,3 hgb 12.1 plt 230   Chest radiograph with mild cardiomegaly, with bilateral hilar vascular congestion, with no effusions or infiltrates.   EKG 91 bpm, left axis deviation, left anterior fascicular block, right bundle branch block, qtc 546, sinus rhythm, with J point elevation II, III, aVF, V4 to V6, positive LVH, with no significant ST segment or T wave changes.   Patient placed on IV furosemide  for diuresis.  Echocardiogram with reduced LV systolic function with biventricular failure.  09/19 cardiac catheterization with elevated filling pressures.  09/20 improving volume status.  09/21 responding well to diuresis, pending left heart catheterization.  09/22 resume IV diuresis, due to elevated filling pressures.  09/23 possible transition to po loop diuretic tomorrow.

## 2024-06-30 NOTE — Progress Notes (Signed)
 Orthopedic Tech Progress Note Patient Details:  Jonathan Fowler September 15, 1956 [id number]  Ortho Devices Type of Ortho Device: Radio broadcast assistant Ortho Device/Splint Location: bilateral Ortho Device/Splint Interventions: Ordered, Application, Adjustment   Post Interventions Patient Tolerated: Well  Adine MARLA Blush 06/30/2024, 6:44 PM

## 2024-06-30 NOTE — Assessment & Plan Note (Signed)
 Calculated BMI is 33.6

## 2024-06-30 NOTE — Assessment & Plan Note (Addendum)
 AKI Hypokalemia. Hypomagnesemia   Today renal function with serum cr trending down at 1.56 with K at 3,0 and serum bicarbonate at 29  Na 142 Mg 1,8   Plan to continue diuresis with furosemide , spironolactone  and SGLT 2 inh Add 40 meq Kcl x3 and 2 g Mag sulfate.  Follow up renal function and electrolytes in am.

## 2024-07-01 ENCOUNTER — Inpatient Hospital Stay (HOSPITAL_COMMUNITY)

## 2024-07-01 ENCOUNTER — Other Ambulatory Visit (HOSPITAL_COMMUNITY): Payer: Self-pay

## 2024-07-01 ENCOUNTER — Telehealth (HOSPITAL_COMMUNITY): Payer: Self-pay | Admitting: Pharmacy Technician

## 2024-07-01 DIAGNOSIS — I5023 Acute on chronic systolic (congestive) heart failure: Secondary | ICD-10-CM

## 2024-07-01 DIAGNOSIS — I340 Nonrheumatic mitral (valve) insufficiency: Secondary | ICD-10-CM

## 2024-07-01 DIAGNOSIS — N1831 Chronic kidney disease, stage 3a: Secondary | ICD-10-CM | POA: Diagnosis not present

## 2024-07-01 DIAGNOSIS — I10 Essential (primary) hypertension: Secondary | ICD-10-CM | POA: Diagnosis not present

## 2024-07-01 DIAGNOSIS — E1169 Type 2 diabetes mellitus with other specified complication: Secondary | ICD-10-CM | POA: Diagnosis not present

## 2024-07-01 LAB — BASIC METABOLIC PANEL WITH GFR
Anion gap: 13 (ref 5–15)
BUN: 27 mg/dL — ABNORMAL HIGH (ref 8–23)
CO2: 25 mmol/L (ref 22–32)
Calcium: 8.6 mg/dL — ABNORMAL LOW (ref 8.9–10.3)
Chloride: 100 mmol/L (ref 98–111)
Creatinine, Ser: 1.61 mg/dL — ABNORMAL HIGH (ref 0.61–1.24)
GFR, Estimated: 46 mL/min — ABNORMAL LOW (ref >60)
Glucose, Bld: 94 mg/dL (ref 70–99)
Potassium: 3.3 mmol/L — ABNORMAL LOW (ref 3.5–5.1)
Sodium: 138 mmol/L (ref 135–145)

## 2024-07-01 LAB — MAGNESIUM: Magnesium: 2 mg/dL (ref 1.7–2.4)

## 2024-07-01 LAB — GLUCOSE, CAPILLARY
Glucose-Capillary: 94 mg/dL (ref 70–99)
Glucose-Capillary: 97 mg/dL (ref 70–99)
Glucose-Capillary: 110 mg/dL — ABNORMAL HIGH (ref 70–99)
Glucose-Capillary: 163 mg/dL — ABNORMAL HIGH (ref 70–99)

## 2024-07-01 LAB — IRON AND TIBC
Iron: 31 ug/dL — ABNORMAL LOW (ref 45–182)
Saturation Ratios: 10 % — ABNORMAL LOW (ref 17.9–39.5)
TIBC: 301 ug/dL (ref 250–450)
UIBC: 270 ug/dL

## 2024-07-01 LAB — TSH: TSH: 3.484 u[IU]/mL (ref 0.350–4.500)

## 2024-07-01 LAB — FERRITIN: Ferritin: 26 ng/mL (ref 24–336)

## 2024-07-01 MED ORDER — POTASSIUM CHLORIDE CRYS ER 20 MEQ PO TBCR
40.0000 meq | EXTENDED_RELEASE_TABLET | ORAL | Status: AC
  Administered 2024-07-01 (×2): 40 meq via ORAL
  Filled 2024-07-01 (×2): qty 2

## 2024-07-01 MED ORDER — GADOBUTROL 1 MMOL/ML IV SOLN
10.0000 mL | Freq: Once | INTRAVENOUS | Status: AC | PRN
  Administered 2024-07-01: 10 mL via INTRAVENOUS

## 2024-07-01 MED ORDER — ASPIRIN 81 MG PO CHEW
81.0000 mg | CHEWABLE_TABLET | ORAL | Status: AC
  Administered 2024-07-02: 81 mg via ORAL
  Filled 2024-07-01: qty 1

## 2024-07-01 MED ORDER — SODIUM CHLORIDE 0.9 % IV SOLN: INTRAVENOUS | Status: DC

## 2024-07-01 NOTE — TOC CM/SW Note (Signed)
 Transition of Care [facility]) - Inpatient Brief Assessment   Patient Details  Name: Jonathan Fowler MRN: [id number] Date of Birth: 1955/12/13  Transition of Care [facility]) CM/SW Contact:    Lauraine FORBES Saa, LCSWA Phone Number: 07/01/2024, 9:19 AM   Clinical Narrative:  9:19 AM Per chart review, patient resides at home with spouse. Patient has a PCP and insurance. Patient does not have SNF/HH/DME history. Patient's preferred pharmacy is [facility] [phone] [place]. TOC consult was placed for [facility] screen and SNF placement. TOC will continue to follow and be available to assist.  Transition of Care Asessment: Insurance and Status: Insurance coverage has been reviewed Patient has primary care physician: Yes Home environment has been reviewed: Private Residence Prior level of function:: N/A Prior/Current Home Services: No current home services Social Drivers of Health Review: SDOH reviewed no interventions necessary Readmission risk has been reviewed: Yes (Currently Yellow 16%) Transition of care needs: transition of care needs identified, TOC will continue to follow

## 2024-07-01 NOTE — Telephone Encounter (Signed)
 Pharmacy Patient Advocate Encounter  Insurance verification completed.    The patient is insured through [facility].     Ran test claim for sacubitril-valsartan 49-51 MG and the current 30 day co-pay is $0.00.  Ran test claim for Farxiga 10mg  and the current 30 day co-pay is $0.00.  Ran test claim for Jardiance  25mg  and the current 30 day co-pay is $0.00.  Ran test claim for isosorbide -hydrALAZINE  20-37.5 MG and the current 30 day co-pay is $0.00.   This test claim was processed through [facility]- copay amounts may vary at other pharmacies due to pharmacy/plan contracts, or as the patient moves through the different stages of their insurance plan.

## 2024-07-01 NOTE — Progress Notes (Signed)
 Advanced Heart Failure Rounding Note  Cardiologist: None  Chief Complaint: Acute Heart Failure  Subjective:   9/17- Diuresed with IV lasix .     Feels much better today. Denies SOB.  Objective:   Weight Range: 109.6 kg Body mass index is 33.7 kg/m.   Vital Signs:   Temp:  [97.6 F (36.4 C)-98.2 F (36.8 C)] 97.6 F (36.4 C) (09/18 0751) Pulse Rate:  [68-83] 83 (09/18 0751) Resp:  [15-25] 15 (09/18 0751) BP: (122-153)/(80-98) 153/96 (09/18 0751) SpO2:  [94 %-100 %] 94 % (09/18 0751) Weight:  [109.6 kg] 109.6 kg (09/17 1428) Last BM Date : 06/29/24  Weight change: Filed Weights   06/29/24 1340 06/30/24 1428  Weight: 109.3 kg 109.6 kg    Intake/Output:   Intake/Output Summary (Last 24 hours) at 07/01/2024 1139 Last data filed at 07/01/2024 1026 Gross per 24 hour  Intake 992.14 ml  Output 2875 ml  Net -1882.86 ml      Physical Exam   General:   No resp difficulty Neck: no JVD.  Cor: Regular rate & rhythm.  Lungs: clear Abdomen: soft, nontender, nondistended.  Extremities: R and LLE compression wraps. No  edema Neuro: alert & oriented x3   Telemetry  SR 70-80s LBBB  EKG   N/A  Labs    CBC Recent Labs    06/29/24 1412 06/30/24 0106  WBC 8.3 6.5  HGB 12.1* 11.6*  HCT 37.7* 36.9*  MCV 92.2 94.1  PLT 230 180   Basic Metabolic Panel Recent Labs    90/82/74 0106 07/01/24 0302  NA 139 138  K 3.1* 3.3*  CL 103 100  CO2 22 25  GLUCOSE 121* 94  BUN 26* 27*  CREATININE 1.35* 1.61*  CALCIUM  8.8* 8.6*  MG 1.6* 2.0   Liver Function Tests Recent Labs    06/29/24 1412  AST 29  ALT 27  ALKPHOS 67  BILITOT 0.5  PROT 6.5  ALBUMIN 3.7   No results for input(s): LIPASE, AMYLASE in the last 72 hours. Cardiac Enzymes No results for input(s): CKTOTAL, CKMB, CKMBINDEX, TROPONINI in the last 72 hours.  BNP: BNP (last 3 results) Recent Labs    06/29/24 1412  BNP 1,746.1*    ProBNP (last 3 results) No results for input(s):  PROBNP in the last 8760 hours.   D-Dimer No results for input(s): DDIMER in the last 72 hours. Hemoglobin A1C Recent Labs    06/30/24 0106  HGBA1C 5.5   Fasting Lipid Panel No results for input(s): CHOL, HDL, LDLCALC, TRIG, CHOLHDL, LDLDIRECT in the last 72 hours. Thyroid  Function Tests Recent Labs    07/01/24 0302  TSH 3.484    Other results:   Imaging    No results found.   Medications:     Scheduled Medications:  atorvastatin   10 mg Oral Daily   empagliflozin   10 mg Oral Daily   enoxaparin  (LOVENOX ) injection  40 mg Subcutaneous Q24H   furosemide   80 mg Intravenous BID   insulin  aspart  0-5 Units Subcutaneous QHS   insulin  aspart  0-9 Units Subcutaneous TID WC   irbesartan   300 mg Oral Daily   isosorbide -hydrALAZINE   1 tablet Oral TID   potassium chloride   40 mEq Oral Q4H   spironolactone   25 mg Oral Daily    Infusions:   PRN Medications: acetaminophen  **OR** acetaminophen , melatonin    Patient Profile  Jonathan Fowler is a very pleasant 68 year old male with history of hypertension, hyperlipidemia, type 2 diabetes, osteoarthritis currently  admitted with acute systolic heart failure.    Assessment/Plan  Acute systolic heart failure - Echo showed EF <20%, LV with GHK, LV severely dilated, GIIDD, RV mod reduced, mod elevated PASP, LA/RA severely dilated, mild MR. TSH ok. HIV NR.  - NYHA IV on admission - Unclear etiology. Need to rule out iCM. HTN CM?, RBBB CM? -Volume status much improved. Given 80 mg IV lasix  this morning. Will stop IV lasix .  Check BMET in am. If creatinine worse will need to delay LHC.  - Continue Jardiance  10 mg daily. Denies hx UTIs. A1c 5.5 - Continue spiro 25 mg daily - Continue Irbesartan  300 mg daily. Allergy to lisinopril, would avoid Entresto.  - Hold BB with volume overload (on coreg  at home).  -- Will need [facility] +/- cMRI--> Ordered. Cath in am.  Informed Consent   Shared Decision Making/Informed  Consent The risks [stroke (1 in 1000), death (1 in 1000), kidney failure [usually temporary] (1 in 500), bleeding (1 in 200), allergic reaction [possibly serious] (1 in 200)], benefits (diagnostic support and management of coronary artery disease) and alternatives of a cardiac catheterization were discussed in detail with Jonathan Fowler and he is willing to proceed.    Elevated HsTrop - HsTrop 48>44 - Suspect mild elevation from demand ischemia with volume overload. - LDL 6/25 46 - Continue statin - LHC to rule out iCM   RBBB - noted recently on echo - present on EKG   HTN - Elevated. Continue  bidil  1 tab TID + irbesartan  300 mg daily  -Avoid hypotension with AKI.    HLD - LDL 6/25 46 - Continue statin  6. AKI Followed by Dr Tobie. Creatinine baseline  Creatinine trending up. Suspect due to diuresis, low suspicion for low output.   Length of Stay: 2  Greig Mosses, NP  07/01/2024, 11:39 AM  Advanced Heart Failure Team Pager [phone] (M-F; 7a - 5p)  Please contact [facility] for night-coverage after hours (5p -7a ) and weekends on [email or web address]

## 2024-07-01 NOTE — H&P (View-Only) (Signed)
 Advanced Heart Failure Rounding Note  Cardiologist: None  Chief Complaint: Acute Heart Failure  Subjective:   9/17- Diuresed with IV lasix .     Feels much better today. Denies SOB.  Objective:   Weight Range: 109.6 kg Body mass index is 33.7 kg/m.   Vital Signs:   Temp:  [97.6 F (36.4 C)-98.2 F (36.8 C)] 97.6 F (36.4 C) (09/18 0751) Pulse Rate:  [68-83] 83 (09/18 0751) Resp:  [15-25] 15 (09/18 0751) BP: (122-153)/(80-98) 153/96 (09/18 0751) SpO2:  [94 %-100 %] 94 % (09/18 0751) Weight:  [109.6 kg] 109.6 kg (09/17 1428) Last BM Date : 06/29/24  Weight change: Filed Weights   06/29/24 1340 06/30/24 1428  Weight: 109.3 kg 109.6 kg    Intake/Output:   Intake/Output Summary (Last 24 hours) at 07/01/2024 1139 Last data filed at 07/01/2024 1026 Gross per 24 hour  Intake 992.14 ml  Output 2875 ml  Net -1882.86 ml      Physical Exam   General:   No resp difficulty Neck: no JVD.  Cor: Regular rate & rhythm.  Lungs: clear Abdomen: soft, nontender, nondistended.  Extremities: R and LLE compression wraps. No  edema Neuro: alert & oriented x3   Telemetry  SR 70-80s LBBB  EKG   N/A  Labs    CBC Recent Labs    06/29/24 1412 06/30/24 0106  WBC 8.3 6.5  HGB 12.1* 11.6*  HCT 37.7* 36.9*  MCV 92.2 94.1  PLT 230 180   Basic Metabolic Panel Recent Labs    90/82/74 0106 07/01/24 0302  NA 139 138  K 3.1* 3.3*  CL 103 100  CO2 22 25  GLUCOSE 121* 94  BUN 26* 27*  CREATININE 1.35* 1.61*  CALCIUM  8.8* 8.6*  MG 1.6* 2.0   Liver Function Tests Recent Labs    06/29/24 1412  AST 29  ALT 27  ALKPHOS 67  BILITOT 0.5  PROT 6.5  ALBUMIN 3.7   No results for input(s): LIPASE, AMYLASE in the last 72 hours. Cardiac Enzymes No results for input(s): CKTOTAL, CKMB, CKMBINDEX, TROPONINI in the last 72 hours.  BNP: BNP (last 3 results) Recent Labs    06/29/24 1412  BNP 1,746.1*    ProBNP (last 3 results) No results for input(s):  PROBNP in the last 8760 hours.   D-Dimer No results for input(s): DDIMER in the last 72 hours. Hemoglobin A1C Recent Labs    06/30/24 0106  HGBA1C 5.5   Fasting Lipid Panel No results for input(s): CHOL, HDL, LDLCALC, TRIG, CHOLHDL, LDLDIRECT in the last 72 hours. Thyroid  Function Tests Recent Labs    07/01/24 0302  TSH 3.484    Other results:   Imaging    No results found.   Medications:     Scheduled Medications:  atorvastatin   10 mg Oral Daily   empagliflozin   10 mg Oral Daily   enoxaparin  (LOVENOX ) injection  40 mg Subcutaneous Q24H   furosemide   80 mg Intravenous BID   insulin  aspart  0-5 Units Subcutaneous QHS   insulin  aspart  0-9 Units Subcutaneous TID WC   irbesartan   300 mg Oral Daily   isosorbide -hydrALAZINE   1 tablet Oral TID   potassium chloride   40 mEq Oral Q4H   spironolactone   25 mg Oral Daily    Infusions:   PRN Medications: acetaminophen  **OR** acetaminophen , melatonin    Patient Profile  Jonathan Fowler is a very pleasant 68 year old male with history of hypertension, hyperlipidemia, type 2 diabetes, osteoarthritis currently  admitted with acute systolic heart failure.    Assessment/Plan  Acute systolic heart failure - Echo showed EF <20%, LV with GHK, LV severely dilated, GIIDD, RV mod reduced, mod elevated PASP, LA/RA severely dilated, mild MR. TSH ok. HIV NR.  - NYHA IV on admission - Unclear etiology. Need to rule out iCM. HTN CM?, RBBB CM? -Volume status much improved. Given 80 mg IV lasix  this morning. Will stop IV lasix .  Check BMET in am. If creatinine worse will need to delay LHC.  - Continue Jardiance  10 mg daily. Denies hx UTIs. A1c 5.5 - Continue spiro 25 mg daily - Continue Irbesartan  300 mg daily. Allergy to lisinopril, would avoid Entresto.  - Hold BB with volume overload (on coreg  at home).  -- Will need [facility] +/- cMRI--> Ordered. Cath in am.  Informed Consent   Shared Decision Making/Informed  Consent The risks [stroke (1 in 1000), death (1 in 1000), kidney failure [usually temporary] (1 in 500), bleeding (1 in 200), allergic reaction [possibly serious] (1 in 200)], benefits (diagnostic support and management of coronary artery disease) and alternatives of a cardiac catheterization were discussed in detail with Jonathan Fowler and he is willing to proceed.    Elevated HsTrop - HsTrop 48>44 - Suspect mild elevation from demand ischemia with volume overload. - LDL 6/25 46 - Continue statin - LHC to rule out iCM   RBBB - noted recently on echo - present on EKG   HTN - Elevated. Continue  bidil  1 tab TID + irbesartan  300 mg daily  -Avoid hypotension with AKI.    HLD - LDL 6/25 46 - Continue statin  6. AKI Followed by Dr Tobie. Creatinine baseline  Creatinine trending up. Suspect due to diuresis, low suspicion for low output.   Length of Stay: 2  Greig Mosses, NP  07/01/2024, 11:39 AM  Advanced Heart Failure Team Pager [phone] (M-F; 7a - 5p)  Please contact [facility] for night-coverage after hours (5p -7a ) and weekends on [email or web address]

## 2024-07-01 NOTE — Plan of Care (Signed)
  Problem: Education: Goal: Ability to describe self-care measures that may prevent or decrease complications (Diabetes Survival Skills Education) will improve Outcome: Progressing Goal: Individualized Educational Video(s) Outcome: Progressing   Problem: Coping: Goal: Ability to adjust to condition or change in health will improve Outcome: Progressing   Problem: Fluid Volume: Goal: Ability to maintain a balanced intake and output will improve Outcome: Progressing   Problem: Health Behavior/Discharge Planning: Goal: Ability to identify and utilize available resources and services will improve Outcome: Progressing Goal: Ability to manage health-related needs will improve Outcome: Progressing   Problem: Metabolic: Goal: Ability to maintain appropriate glucose levels will improve Outcome: Progressing   Problem: Nutritional: Goal: Maintenance of adequate nutrition will improve Outcome: Progressing Goal: Progress toward achieving an optimal weight will improve Outcome: Progressing   Problem: Skin Integrity: Goal: Risk for impaired skin integrity will decrease Outcome: Progressing   Problem: Tissue Perfusion: Goal: Adequacy of tissue perfusion will improve Outcome: Progressing   Problem: Education: Goal: Knowledge of General Education information will improve Description: Including pain rating scale, medication(s)/side effects and non-pharmacologic comfort measures Outcome: Progressing   Problem: Health Behavior/Discharge Planning: Goal: Ability to manage health-related needs will improve Outcome: Progressing   Problem: Clinical Measurements: Goal: Ability to maintain clinical measurements within normal limits will improve Outcome: Progressing Goal: Will remain free from infection Outcome: Progressing Goal: Diagnostic test results will improve Outcome: Progressing Goal: Respiratory complications will improve Outcome: Progressing Goal: Cardiovascular complication will  be avoided Outcome: Progressing   Problem: Activity: Goal: Risk for activity intolerance will decrease Outcome: Progressing   Problem: Nutrition: Goal: Adequate nutrition will be maintained Outcome: Progressing   Problem: Coping: Goal: Level of anxiety will decrease Outcome: Progressing   Problem: Elimination: Goal: Will not experience complications related to bowel motility Outcome: Progressing Goal: Will not experience complications related to urinary retention Outcome: Progressing   Problem: Pain Managment: Goal: General experience of comfort will improve and/or be controlled Outcome: Progressing   Problem: Skin Integrity: Goal: Risk for impaired skin integrity will decrease Outcome: Progressing   Problem: Education: Goal: Ability to demonstrate management of disease process will improve Outcome: Progressing Goal: Ability to verbalize understanding of medication therapies will improve Outcome: Progressing Goal: Individualized Educational Video(s) Outcome: Progressing   Problem: Activity: Goal: Capacity to carry out activities will improve Outcome: Progressing   Problem: Cardiac: Goal: Ability to achieve and maintain adequate cardiopulmonary perfusion will improve Outcome: Progressing

## 2024-07-01 NOTE — Progress Notes (Signed)
 Progress Note   Patient: Jonathan Fowler FMW:[id number] DOB: 05/01/1956 DOA: 06/29/2024     2 DOS: the patient was seen and examined on 07/01/2024   Brief hospital course: Jonathan Fowler was admitted to the hospital with the working diagnosis of heart failure decompensation.   68 yo male with past medical history of hypertension, hyperlipidemia, and T2DM who presented with dyspnea. Reported one week of worsening dyspnea, orthopnea, PND and lower extremity edema. On the day of admission he was evaluated by his primary care provider, found him volume overloaded and referred him to the hospital. On his initial physical examination his blood pressure was 165/114, HR 86, RR 18 and 02 saturation 97% Lungs with bilateral rales with no wheezing or rhonchi, heart with S1 and S2 present and regular with no gallops or murmurs, abdomen with no distention, positive lower extremity edema pitting +++.   Na 138, K 3,8 Cl 105 bicarbonate 21, glucose 131, bun 29 cr 1.36  AST 29 ALT 27  BNP 1,746 High sensitive troponin 48 and 44  Wbc 8,3 hgb 12.1 plt 230   Chest radiograph with mild cardiomegaly, with bilateral hilar vascular congestion, with no effusions or infiltrates.   EKG 91 bpm, left axis deviation, left anterior fascicular block, right bundle branch block, qtc 546, sinus rhythm, with J point elevation II, III, aVF, V4 to V6, positive LVH, with no significant ST segment or T wave changes.   Patient placed on IV furosemide  for diuresis.  Echocardiogram with reduced LV systolic function.   Assessment and Plan: * Acute on chronic systolic CHF (congestive heart failure) (HCC) Echocardiogram with reduced LV systolic function with EF <20%, global hypokinesis, severe dilatated LV cavity, grade II diastolic dysfunction (pseudo normalization EA), RV systolic function with moderate reduction, LA and RA with severe dilatation, RVSP 46,6 mmHg, mild TR and mild MR.   Documented urine output is 1,800  ml Systolic blood pressure 140 mmHg.   Plan to continue diuresis with furosemide  80 mg IV bid SGLT 2 inh, spironolactone  and afterload reduction with irbesartan , Bidil .  Follow up cardiac catheterization and cardiac MRI.   Plan to increase furosemide  to 60 mg IV bid Resume mineralocorticoid receptor blocker and ARB Add SGLT 2 inh to augment diuresis.  Hold on carvedilol  until result from echocardiogram.  Possible transition to Enetresto during this hospitalization.   Essential hypertension Continue blood pressure control with irbesartan , bidil , and aggressive diuresis with furosemide .  Spironolactone .   Chronic kidney disease, stage 3a (HCC) AKI Hypokalemia. Hypomagnesemia   Follow up renal function with serum cr at 1.61 with K at 3,3 and serum bicarbonate at 25  Na 138 and Mg 2.0   Plan to continue diuresis with furosemide , spironolactone  and SGLT 2 inh Add 40 meq Kcl x2 Follow up renal function and electrolytes in am.   Type 2 diabetes mellitus with hyperlipidemia (HCC) Continue insulin  sliding scale for glucose cover and monitoring.  Continue statin therapy   Obesity, class 1 Calculated BMI is 33.6      Subjective: Patient is feeling better but not yet back to baseline, dyspnea and edema are improving.   Physical Exam: Vitals:   06/30/24 1931 06/30/24 2317 07/01/24 0421 07/01/24 0751  BP: 129/84 (!) 140/87 137/89 (!) 153/96  Pulse: 70 79 70 83  Resp: 19 18  15   Temp: 98.2 F (36.8 C) 98.2 F (36.8 C) 98 F (36.7 C) 97.6 F (36.4 C)  TempSrc: Oral Oral Oral Oral  SpO2: 96% 98% 97% 94%  Weight:      Height:       Neurology awake and alert ENT with mild pallor Cardiovascular with S1 and S2 present and regular, with no gallops, positive systolic murmur at the left lower sternal border No JVD Respiratory with rales at bases with no wheezing or rhonchi  Abdomen with no distention  Lower extremity edema + unna boots in place.   Data Reviewed:    Family  Communication: no family at the bedside   Disposition: Status is: Inpatient Remains inpatient appropriate because: IV diuresis and cardiac work up.   Planned Discharge Destination: Home    Author: Elidia Toribio Furnace, MD 07/01/2024 10:18 AM  For on call review [email or web address].

## 2024-07-02 ENCOUNTER — Encounter (HOSPITAL_COMMUNITY)
Admission: EM | Disposition: A | Discharge status: Home / Self Care | Payer: Self-pay | Source: Ambulatory Visit | Attending: Internal Medicine

## 2024-07-02 DIAGNOSIS — I5082 Biventricular heart failure: Secondary | ICD-10-CM | POA: Diagnosis not present

## 2024-07-02 DIAGNOSIS — N1831 Chronic kidney disease, stage 3a: Secondary | ICD-10-CM | POA: Diagnosis not present

## 2024-07-02 DIAGNOSIS — E1169 Type 2 diabetes mellitus with other specified complication: Secondary | ICD-10-CM | POA: Diagnosis not present

## 2024-07-02 DIAGNOSIS — N179 Acute kidney failure, unspecified: Secondary | ICD-10-CM | POA: Diagnosis not present

## 2024-07-02 DIAGNOSIS — I428 Other cardiomyopathies: Secondary | ICD-10-CM | POA: Diagnosis not present

## 2024-07-02 DIAGNOSIS — I5023 Acute on chronic systolic (congestive) heart failure: Secondary | ICD-10-CM | POA: Diagnosis not present

## 2024-07-02 DIAGNOSIS — I10 Essential (primary) hypertension: Secondary | ICD-10-CM | POA: Diagnosis not present

## 2024-07-02 DIAGNOSIS — D509 Iron deficiency anemia, unspecified: Secondary | ICD-10-CM

## 2024-07-02 HISTORY — PX: RIGHT HEART CATH: CATH118263

## 2024-07-02 LAB — BASIC METABOLIC PANEL WITH GFR
Anion gap: 9 (ref 5–15)
BUN: 27 mg/dL — ABNORMAL HIGH (ref 8–23)
CO2: 26 mmol/L (ref 22–32)
Calcium: 8.6 mg/dL — ABNORMAL LOW (ref 8.9–10.3)
Chloride: 104 mmol/L (ref 98–111)
Creatinine, Ser: 1.72 mg/dL — ABNORMAL HIGH (ref 0.61–1.24)
GFR, Estimated: 43 mL/min — ABNORMAL LOW (ref >60)
Glucose, Bld: 116 mg/dL — ABNORMAL HIGH (ref 70–99)
Potassium: 3.3 mmol/L — ABNORMAL LOW (ref 3.5–5.1)
Sodium: 139 mmol/L (ref 135–145)

## 2024-07-02 LAB — GLUCOSE, CAPILLARY
Glucose-Capillary: 92 mg/dL (ref 70–99)
Glucose-Capillary: 147 mg/dL — ABNORMAL HIGH (ref 70–99)
Glucose-Capillary: 105 mg/dL — ABNORMAL HIGH (ref 70–99)
Glucose-Capillary: 120 mg/dL — ABNORMAL HIGH (ref 70–99)

## 2024-07-02 LAB — POCT I-STAT EG7
Acid-Base Excess: 4 mmol/L — ABNORMAL HIGH (ref 0.0–2.0)
Acid-Base Excess: 4 mmol/L — ABNORMAL HIGH (ref 0.0–2.0)
Bicarbonate: 28.5 mmol/L — ABNORMAL HIGH (ref 20.0–28.0)
Bicarbonate: 28.9 mmol/L — ABNORMAL HIGH (ref 20.0–28.0)
Calcium, Ion: 1.18 mmol/L (ref 1.15–1.40)
Calcium, Ion: 1.18 mmol/L (ref 1.15–1.40)
HCT: 33 % — ABNORMAL LOW (ref 39.0–52.0)
HCT: 33 % — ABNORMAL LOW (ref 39.0–52.0)
Hemoglobin: 11.2 g/dL — ABNORMAL LOW (ref 13.0–17.0)
Hemoglobin: 11.2 g/dL — ABNORMAL LOW (ref 13.0–17.0)
O2 Saturation: 64 %
O2 Saturation: 64 %
Potassium: 3.2 mmol/L — ABNORMAL LOW (ref 3.5–5.1)
Potassium: 3.2 mmol/L — ABNORMAL LOW (ref 3.5–5.1)
Sodium: 141 mmol/L (ref 135–145)
Sodium: 141 mmol/L (ref 135–145)
TCO2: 30 mmol/L (ref 22–32)
TCO2: 30 mmol/L (ref 22–32)
pCO2, Ven: 43.8 mmHg — ABNORMAL LOW (ref 44–60)
pCO2, Ven: 44.6 mmHg (ref 44–60)
pH, Ven: 7.42 (ref 7.25–7.43)
pH, Ven: 7.421 (ref 7.25–7.43)
pO2, Ven: 33 mmHg (ref 32–45)
pO2, Ven: 33 mmHg (ref 32–45)

## 2024-07-02 LAB — MAGNESIUM: Magnesium: 2 mg/dL (ref 1.7–2.4)

## 2024-07-02 SURGERY — RIGHT HEART CATH
Anesthesia: LOCAL

## 2024-07-02 MED ORDER — LIDOCAINE HCL (PF) 1 % IJ SOLN
INTRAMUSCULAR | Status: AC
  Filled 2024-07-02: qty 30

## 2024-07-02 MED ORDER — ALPRAZOLAM 0.5 MG PO TABS
0.5000 mg | ORAL_TABLET | Freq: Every evening | ORAL | Status: DC | PRN
  Administered 2024-07-02 – 2024-07-06 (×5): 0.5 mg via ORAL
  Filled 2024-07-02 (×5): qty 1

## 2024-07-02 MED ORDER — HEPARIN (PORCINE) IN NACL 1000-0.9 UT/500ML-% IV SOLN
INTRAVENOUS | Status: DC | PRN
  Administered 2024-07-02: 500 mL

## 2024-07-02 MED ORDER — POTASSIUM CHLORIDE CRYS ER 20 MEQ PO TBCR
40.0000 meq | EXTENDED_RELEASE_TABLET | ORAL | Status: AC
  Administered 2024-07-02 (×2): 40 meq via ORAL
  Filled 2024-07-02 (×2): qty 2

## 2024-07-02 MED ORDER — FUROSEMIDE 10 MG/ML IJ SOLN
80.0000 mg | Freq: Two times a day (BID) | INTRAMUSCULAR | Status: DC
  Administered 2024-07-02 – 2024-07-03 (×3): 80 mg via INTRAVENOUS
  Filled 2024-07-02 (×3): qty 8

## 2024-07-02 MED ORDER — SODIUM CHLORIDE 0.9 % IV SOLN
500.0000 mg | INTRAVENOUS | Status: AC
  Administered 2024-07-02 – 2024-07-03 (×2): 500 mg via INTRAVENOUS
  Filled 2024-07-02 (×2): qty 25

## 2024-07-02 MED ORDER — POTASSIUM CHLORIDE CRYS ER 20 MEQ PO TBCR
40.0000 meq | EXTENDED_RELEASE_TABLET | Freq: Once | ORAL | Status: AC
  Administered 2024-07-02: 40 meq via ORAL
  Filled 2024-07-02: qty 2

## 2024-07-02 MED ORDER — LIDOCAINE HCL (PF) 1 % IJ SOLN
INTRAMUSCULAR | Status: DC | PRN
  Administered 2024-07-02: 5 mL

## 2024-07-02 MED ORDER — ISOSORB DINITRATE-HYDRALAZINE 20-37.5 MG PO TABS
1.5000 | ORAL_TABLET | Freq: Three times a day (TID) | ORAL | Status: DC
  Administered 2024-07-02 – 2024-07-06 (×12): 1.5 via ORAL
  Filled 2024-07-02 (×15): qty 1.5

## 2024-07-02 SURGICAL SUPPLY — 5 items
CATH SWAN GANZ 7F STRAIGHT (CATHETERS) IMPLANT
GLIDESHEATH SLENDER 7FR .021G (SHEATH) IMPLANT
GUIDEWIRE .025 260CM (WIRE) IMPLANT
PACK CARDIAC CATHETERIZATION (CUSTOM PROCEDURE TRAY) ×1 IMPLANT
SHEATH GLIDE SLENDER 4/5FR (SHEATH) IMPLANT

## 2024-07-02 NOTE — Progress Notes (Addendum)
 Progress Note   Patient: Jonathan Fowler FMW:[id number] DOB: 1955/12/07 DOA: 06/29/2024     3 DOS: the patient was seen and examined on 07/02/2024   Brief hospital course: Jonathan Fowler was admitted to the hospital with the working diagnosis of heart failure decompensation.   68 yo male with past medical history of hypertension, hyperlipidemia, and T2DM who presented with dyspnea. Reported one week of worsening dyspnea, orthopnea, PND and lower extremity edema. On the day of admission he was evaluated by his primary care provider, found him volume overloaded and referred him to the hospital. On his initial physical examination his blood pressure was 165/114, HR 86, RR 18 and 02 saturation 97% Lungs with bilateral rales with no wheezing or rhonchi, heart with S1 and S2 present and regular with no gallops or murmurs, abdomen with no distention, positive lower extremity edema pitting +++.   Na 138, K 3,8 Cl 105 bicarbonate 21, glucose 131, bun 29 cr 1.36  AST 29 ALT 27  BNP 1,746 High sensitive troponin 48 and 44  Wbc 8,3 hgb 12.1 plt 230   Chest radiograph with mild cardiomegaly, with bilateral hilar vascular congestion, with no effusions or infiltrates.   EKG 91 bpm, left axis deviation, left anterior fascicular block, right bundle branch block, qtc 546, sinus rhythm, with J point elevation II, III, aVF, V4 to V6, positive LVH, with no significant ST segment or T wave changes.   Patient placed on IV furosemide  for diuresis.  Echocardiogram with reduced LV systolic function.  09/19 cardiac catheterization with elevated filling pressures.   Assessment and Plan: * Acute on chronic systolic CHF (congestive heart failure) (HCC) Echocardiogram with reduced LV systolic function with EF <20%, global hypokinesis, severe dilatated LV cavity, grade II diastolic dysfunction (pseudo normalization EA), RV systolic function with moderate reduction, LA and RA with severe dilatation, RVSP 46,6 mmHg, mild  TR and mild MR.   09/19 cardiac catheterization  RA 9  RV 64/10 PA 58/29 mean 40  PCWP mean 27 Cardiac output 5,76 L/min and index 2.5 L/min/m2(Fick) Cardiac output 4.46 L/min and index 2 L/min/m2 (thermodilution)  PVR 2.25 to 2.9 Wood Units  Elevated filling pressures, with pre and post capillary pulmonary hypertension.   Cardiac MRI  Reduced LV systolic function 16%, global hypokinesis, RV with severe dilatation with EF 29%, non ischemic cardiomyopathy.   Urine output 5,975 ml Systolic blood pressure 140 to 160 mmHg.   Plan to continue diuresis with furosemide  80 mg IV bid SGLT 2 inh, spironolactone  and afterload reduction with irbesartan , Bidil .   Essential hypertension Continue blood pressure control with irbesartan , bidil , and aggressive diuresis with furosemide .  Spironolactone .   Chronic kidney disease, stage 3a (HCC) AKI Hypokalemia. Hypomagnesemia   Renal function with serum cr at 1,72 with K at 3,3 and serum bicarbonate at 26  Na 139 and Mg 2,0   Plan to continue diuresis with furosemide , spironolactone  and SGLT 2 inh Add 40 meq Kcl x3 Follow up renal function and electrolytes in am.   Type 2 diabetes mellitus with hyperlipidemia (HCC) Continue insulin  sliding scale for glucose cover and monitoring.  Continue statin therapy   Iron  deficiency anemia Serum iron  31, transferrin saturation 10, ferritin 26 and TIBC 301.  Hgb 11.2 IV iron  sucrose 500 mg   Obesity, class 1 Calculated BMI is 33.6    Subjective: patient continue to have dyspnea, edema has improved, he has been anxious   Physical Exam: Vitals:   07/02/24 0918 07/02/24 0923 07/02/24  9071 07/02/24 1103  BP: (!) 161/103 (!) 159/105 (!) 164/107 (!) 144/83  Pulse: 86 86 85 90  Resp: (!) 26 (!) 21 (!) 22 20  Temp:    98.1 F (36.7 C)  TempSrc:    Oral  SpO2: 95% 95% 92% 96%  Weight:      Height:       Neurology awake and alert ENT with mild pallor Cardiovascular with S1 and S2 present and  regular with no gallops, or rubs, positive systolic murmur at the left lower sternal border Respiratory with bilateral rales with no wheezing or rhonchi  Abdomen with no distention  Lower extremity edema + unna boots in place  Data Reviewed:    Family Communication: no family at the bedside   Disposition: Status is: Inpatient Remains inpatient appropriate because: IV diuresis  Planned Discharge Destination: Home     Author: Elidia Toribio Furnace, MD 07/02/2024 3:06 PM  For on call review [email or web address].

## 2024-07-02 NOTE — Interval H&P Note (Signed)
 History and Physical Interval Note:  07/02/2024 8:59 AM  [place] HERO Doner  has presented today for surgery, with the diagnosis of CHF.  The various methods of treatment have been discussed with the patient and family. After consideration of risks, benefits and other options for treatment, the patient has consented to  Procedure(s): RIGHT HEART CATH (N/A) as a surgical intervention.  The patient's history has been reviewed, patient examined, no change in status, stable for surgery.  I have reviewed the patient's chart and labs.  Questions were answered to the patient's satisfaction.     Akshitha Culmer

## 2024-07-02 NOTE — Progress Notes (Signed)
 Advanced Heart Failure Rounding Note  Cardiologist: None  Chief Complaint: Acute Heart Failure  Subjective:   9/17- Diuresed with IV lasix .  9/18- Lasix  held due to creatinine bump. CMRI - NICM Biventricular HF.   Denies shortness of breath.  Objective:   Weight Range: 106.4 kg Body mass index is 32.72 kg/m.   Vital Signs:   Temp:  [97.7 F (36.5 C)-98.3 F (36.8 C)] 97.7 F (36.5 C) (09/19 0500) Pulse Rate:  [67-85] 83 (09/19 0500) Resp:  [16-18] 16 (09/19 0500) BP: (106-153)/(70-92) 153/80 (09/19 0500) SpO2:  [94 %-96 %] 95 % (09/19 0500) Weight:  [106.4 kg] 106.4 kg (09/19 0500) Last BM Date : 07/01/24  Weight change: Filed Weights   06/29/24 1340 06/30/24 1428 07/02/24 0500  Weight: 109.3 kg 109.6 kg 106.4 kg    Intake/Output:   Intake/Output Summary (Last 24 hours) at 07/02/2024 0759 Last data filed at 07/02/2024 0500 Gross per 24 hour  Intake 240 ml  Output 5975 ml  Net -5735 ml      Physical Exam   General:   No resp difficulty Neck: no JVD.  Cor: Regular rate & rhythm.  Lungs: clear Abdomen: soft, nontender, nondistended.  Extremities: no  edema Neuro: alert & oriented x3    Telemetry  SR 70-80s LBBB with PVCs.   EKG   N/A  Labs    CBC Recent Labs    06/29/24 1412 06/30/24 0106  WBC 8.3 6.5  HGB 12.1* 11.6*  HCT 37.7* 36.9*  MCV 92.2 94.1  PLT 230 180   Basic Metabolic Panel Recent Labs    90/81/74 0302 07/02/24 0255  NA 138 139  K 3.3* 3.3*  CL 100 104  CO2 25 26  GLUCOSE 94 116*  BUN 27* 27*  CREATININE 1.61* 1.72*  CALCIUM  8.6* 8.6*  MG 2.0 2.0   Liver Function Tests Recent Labs    06/29/24 1412  AST 29  ALT 27  ALKPHOS 67  BILITOT 0.5  PROT 6.5  ALBUMIN 3.7   No results for input(s): LIPASE, AMYLASE in the last 72 hours. Cardiac Enzymes No results for input(s): CKTOTAL, CKMB, CKMBINDEX, TROPONINI in the last 72 hours.  BNP: BNP (last 3 results) Recent Labs    06/29/24 1412  BNP  1,746.1*    ProBNP (last 3 results) No results for input(s): PROBNP in the last 8760 hours.   D-Dimer No results for input(s): DDIMER in the last 72 hours. Hemoglobin A1C Recent Labs    06/30/24 0106  HGBA1C 5.5   Fasting Lipid Panel No results for input(s): CHOL, HDL, LDLCALC, TRIG, CHOLHDL, LDLDIRECT in the last 72 hours. Thyroid  Function Tests Recent Labs    07/01/24 0302  TSH 3.484    Other results:   Imaging    MR CARDIAC MORPHOLOGY W WO CONTRAST Result Date: 07/01/2024 CLINICAL DATA:  Heart failure, known or suspected, initial workup EXAM: MR CARDIAC VELOCITY FLOW MAPPING; MR CARDIA MORPHOLOGY WITHOUT AND WITH CONTRAST TECHNIQUE: The patient was scanned on a 1.5 Tesla Siemens magnet. A dedicated cardiac coil was used. Functional imaging was done using TrueFisp sequences. 2,3, and 4 chamber views were done to assess for RWMA's. Modified Simpson's rule using a short axis stack was used to calculate an ejection fraction on a dedicated work [employer] officer, [employer]. The patient received 10mL GADAVIST  GADOBUTROL  1 MMOL/ML IV SOLN. After 10 minutes inversion recovery sequences were used to assess for infiltration and scar tissue. Phase contrast velocity encoded images obtained  x 2. This examination is tailored for evaluation cardiac anatomy and function and provides very limited assessment of noncardiac structures, which are accordingly not evaluated during interpretation. If there is clinical concern for extracardiac pathology, further evaluation with CT imaging should be considered. FINDINGS: LEFT VENTRICLE: Severely dilated left ventricular cavity. Maximum septal wall thickness: 0.9 cm. Posterior wall thickness 0.7 cm. Left ventricular internal diameter (diastole): 8.1 cm. Global hypokinesis. LV EF: 16% (Normal 49-79%) Absolute volumes: LV EDV: 451 mL (Normal 95-215 mL) LV ESV: 378 mL (Normal 25-85 mL) LV SV: 73 mL (Normal 61-145 mL) CO: 5.7 L/min (Normal  3.4-7.8 L/min) Indexed volumes: LV EDV: 192 mL/sq-m (Normal 50-108 mL/sq-m) LV ESV: 161 mL/sq-m (Normal 11-47 mL/sq-m) LV SV: 31 mL/sq-m (Normal 33-72 mL/sq-m) CI: 2.4 L/min/sq-m (Normal 1.8-4.2 L/min/sq-m) RIGHT VENTRICLE: Severely dilated right ventricular cavity. Normal right ventricular wall thickness. Severely reduced right ventricular systolic function. There are no regional wall motion abnormalities. RV EF:  29% (Normal 51-80%) Absolute volumes: RV EDV: 301 mL (Normal 109-217 mL) RV ESV: 215 mL (Normal 23-91 mL) RV SV: 86 mL (Normal 71-141 mL) CO: 6.7 L/min (Normal 2.8-8.8 L/min) Indexed volumes: RV EDV: 128 mL/sq-m (Normal 58-109 mL/sq-m) RV ESV: 92 mL/sq-m (Normal 12-46 mL/sq-m) RV SV: 37 mL/sq-m (Normal 38-71 mL/sq-m) CI: 2.9 L/min/sq-m (Normal 1.7-4.2 L/min/sq-m) Left atrium: Severely dilated. Right atrium: Severely dilated. Mitral valve: Normal mitral valve without mild regurgitation, RF 10%. Aortic valve: Tricuspid aortic valve with mild regurgitation, RF 14%. Tricuspid valve: Normal tricuspid valve with mild regurgitation, RF 15%. Pulmonic valve: Normal pulmonary valve with mild regurgitation, RF 9%. Aorta: The aortic root and proximal ascending aorta are normal in diameter. Pulmonary artery: Dilated suggestive of pulmonary hypertension. Pericardium: The pericardium is normal thickness. There is no pericardial effusion. There is no evidence of intracardiac thrombus. Systemic flow across the aortic valve (QS) was 5.4 L/min. Pulmonary flow across the pulmonic valve (QP) was 5.9 L/min. The QP:QS calculated across the aortic and pulmonic valves was 1.17. Native myocardial T1-relaxation times were slightly elevated at 1096 ms. Native myocardial T2-relaxation times were normal at 49 ms. Delayed Enhancement: Faint mid wall LGE is present in the basal septum which is a non-specific finding in dilated cardiomyopathy. RV insertion LGE is present which is also a non-specific finding. Tiny, subendocardial LGE  (<50%) is present in the basal anterolateral segment, possibly represents embolic infarct, and this area is viable. Extracardiac structures: No significant findings IMPRESSION: 1. The left ventricle is severely dilated with normal wall thickness. Systolic function was severely reduced, LVEF 16%. Global hypokinesis. 2. The right ventricle severely dilated with severely reduced function, RVEF 29%. 3. Mild mitral valve regurgitation, RF 10%. 4. Faint mid wall LGE is present in the basal septum which is a non-specific finding in non-ischemic cardiomyopathy. RV insertion LGE is present which is nonspecific. Tiny, subendocardial LGE (<50%) is present in the basal anterolateral segment, possibly represents embolic infarct, and this area is viable. Overall, findings are consistent with a nonischemic cardiomyopathy. No evidence of infiltrative disorder. Darryle Decent, MD Electronically Signed   By: Darryle Decent M.D.   On: 07/01/2024 16:38   MR CARDIAC VELOCITY FLOW MAP Result Date: 07/01/2024 CLINICAL DATA:  Heart failure, known or suspected, initial workup EXAM: MR CARDIAC VELOCITY FLOW MAPPING; MR CARDIA MORPHOLOGY WITHOUT AND WITH CONTRAST TECHNIQUE: The patient was scanned on a 1.5 Tesla Siemens magnet. A dedicated cardiac coil was used. Functional imaging was done using TrueFisp sequences. 2,3, and 4 chamber views were done to assess for  RWMA's. Modified Simpson's rule using a short axis stack was used to calculate an ejection fraction on a dedicated work [employer] officer, [employer]. The patient received 10mL GADAVIST  GADOBUTROL  1 MMOL/ML IV SOLN. After 10 minutes inversion recovery sequences were used to assess for infiltration and scar tissue. Phase contrast velocity encoded images obtained x 2. This examination is tailored for evaluation cardiac anatomy and function and provides very limited assessment of noncardiac structures, which are accordingly not evaluated during interpretation. If there is clinical  concern for extracardiac pathology, further evaluation with CT imaging should be considered. FINDINGS: LEFT VENTRICLE: Severely dilated left ventricular cavity. Maximum septal wall thickness: 0.9 cm. Posterior wall thickness 0.7 cm. Left ventricular internal diameter (diastole): 8.1 cm. Global hypokinesis. LV EF: 16% (Normal 49-79%) Absolute volumes: LV EDV: 451 mL (Normal 95-215 mL) LV ESV: 378 mL (Normal 25-85 mL) LV SV: 73 mL (Normal 61-145 mL) CO: 5.7 L/min (Normal 3.4-7.8 L/min) Indexed volumes: LV EDV: 192 mL/sq-m (Normal 50-108 mL/sq-m) LV ESV: 161 mL/sq-m (Normal 11-47 mL/sq-m) LV SV: 31 mL/sq-m (Normal 33-72 mL/sq-m) CI: 2.4 L/min/sq-m (Normal 1.8-4.2 L/min/sq-m) RIGHT VENTRICLE: Severely dilated right ventricular cavity. Normal right ventricular wall thickness. Severely reduced right ventricular systolic function. There are no regional wall motion abnormalities. RV EF:  29% (Normal 51-80%) Absolute volumes: RV EDV: 301 mL (Normal 109-217 mL) RV ESV: 215 mL (Normal 23-91 mL) RV SV: 86 mL (Normal 71-141 mL) CO: 6.7 L/min (Normal 2.8-8.8 L/min) Indexed volumes: RV EDV: 128 mL/sq-m (Normal 58-109 mL/sq-m) RV ESV: 92 mL/sq-m (Normal 12-46 mL/sq-m) RV SV: 37 mL/sq-m (Normal 38-71 mL/sq-m) CI: 2.9 L/min/sq-m (Normal 1.7-4.2 L/min/sq-m) Left atrium: Severely dilated. Right atrium: Severely dilated. Mitral valve: Normal mitral valve without mild regurgitation, RF 10%. Aortic valve: Tricuspid aortic valve with mild regurgitation, RF 14%. Tricuspid valve: Normal tricuspid valve with mild regurgitation, RF 15%. Pulmonic valve: Normal pulmonary valve with mild regurgitation, RF 9%. Aorta: The aortic root and proximal ascending aorta are normal in diameter. Pulmonary artery: Dilated suggestive of pulmonary hypertension. Pericardium: The pericardium is normal thickness. There is no pericardial effusion. There is no evidence of intracardiac thrombus. Systemic flow across the aortic valve (QS) was 5.4 L/min. Pulmonary flow  across the pulmonic valve (QP) was 5.9 L/min. The QP:QS calculated across the aortic and pulmonic valves was 1.17. Native myocardial T1-relaxation times were slightly elevated at 1096 ms. Native myocardial T2-relaxation times were normal at 49 ms. Delayed Enhancement: Faint mid wall LGE is present in the basal septum which is a non-specific finding in dilated cardiomyopathy. RV insertion LGE is present which is also a non-specific finding. Tiny, subendocardial LGE (<50%) is present in the basal anterolateral segment, possibly represents embolic infarct, and this area is viable. Extracardiac structures: No significant findings IMPRESSION: 1. The left ventricle is severely dilated with normal wall thickness. Systolic function was severely reduced, LVEF 16%. Global hypokinesis. 2. The right ventricle severely dilated with severely reduced function, RVEF 29%. 3. Mild mitral valve regurgitation, RF 10%. 4. Faint mid wall LGE is present in the basal septum which is a non-specific finding in non-ischemic cardiomyopathy. RV insertion LGE is present which is nonspecific. Tiny, subendocardial LGE (<50%) is present in the basal anterolateral segment, possibly represents embolic infarct, and this area is viable. Overall, findings are consistent with a nonischemic cardiomyopathy. No evidence of infiltrative disorder. Darryle Decent, MD Electronically Signed   By: Darryle Decent M.D.   On: 07/01/2024 16:38   MR CARDIAC VELOCITY FLOW MAP Result Date: 07/01/2024 CLINICAL DATA:  Heart failure, known or suspected, initial workup EXAM: MR CARDIAC VELOCITY FLOW MAPPING; MR CARDIA MORPHOLOGY WITHOUT AND WITH CONTRAST TECHNIQUE: The patient was scanned on a 1.5 Tesla Siemens magnet. A dedicated cardiac coil was used. Functional imaging was done using TrueFisp sequences. 2,3, and 4 chamber views were done to assess for RWMA's. Modified Simpson's rule using a short axis stack was used to calculate an ejection fraction on a dedicated work  Research officer, [employer]. The patient received 10mL GADAVIST  GADOBUTROL  1 MMOL/ML IV SOLN. After 10 minutes inversion recovery sequences were used to assess for infiltration and scar tissue. Phase contrast velocity encoded images obtained x 2. This examination is tailored for evaluation cardiac anatomy and function and provides very limited assessment of noncardiac structures, which are accordingly not evaluated during interpretation. If there is clinical concern for extracardiac pathology, further evaluation with CT imaging should be considered. FINDINGS: LEFT VENTRICLE: Severely dilated left ventricular cavity. Maximum septal wall thickness: 0.9 cm. Posterior wall thickness 0.7 cm. Left ventricular internal diameter (diastole): 8.1 cm. Global hypokinesis. LV EF: 16% (Normal 49-79%) Absolute volumes: LV EDV: 451 mL (Normal 95-215 mL) LV ESV: 378 mL (Normal 25-85 mL) LV SV: 73 mL (Normal 61-145 mL) CO: 5.7 L/min (Normal 3.4-7.8 L/min) Indexed volumes: LV EDV: 192 mL/sq-m (Normal 50-108 mL/sq-m) LV ESV: 161 mL/sq-m (Normal 11-47 mL/sq-m) LV SV: 31 mL/sq-m (Normal 33-72 mL/sq-m) CI: 2.4 L/min/sq-m (Normal 1.8-4.2 L/min/sq-m) RIGHT VENTRICLE: Severely dilated right ventricular cavity. Normal right ventricular wall thickness. Severely reduced right ventricular systolic function. There are no regional wall motion abnormalities. RV EF:  29% (Normal 51-80%) Absolute volumes: RV EDV: 301 mL (Normal 109-217 mL) RV ESV: 215 mL (Normal 23-91 mL) RV SV: 86 mL (Normal 71-141 mL) CO: 6.7 L/min (Normal 2.8-8.8 L/min) Indexed volumes: RV EDV: 128 mL/sq-m (Normal 58-109 mL/sq-m) RV ESV: 92 mL/sq-m (Normal 12-46 mL/sq-m) RV SV: 37 mL/sq-m (Normal 38-71 mL/sq-m) CI: 2.9 L/min/sq-m (Normal 1.7-4.2 L/min/sq-m) Left atrium: Severely dilated. Right atrium: Severely dilated. Mitral valve: Normal mitral valve without mild regurgitation, RF 10%. Aortic valve: Tricuspid aortic valve with mild regurgitation, RF 14%. Tricuspid valve:  Normal tricuspid valve with mild regurgitation, RF 15%. Pulmonic valve: Normal pulmonary valve with mild regurgitation, RF 9%. Aorta: The aortic root and proximal ascending aorta are normal in diameter. Pulmonary artery: Dilated suggestive of pulmonary hypertension. Pericardium: The pericardium is normal thickness. There is no pericardial effusion. There is no evidence of intracardiac thrombus. Systemic flow across the aortic valve (QS) was 5.4 L/min. Pulmonary flow across the pulmonic valve (QP) was 5.9 L/min. The QP:QS calculated across the aortic and pulmonic valves was 1.17. Native myocardial T1-relaxation times were slightly elevated at 1096 ms. Native myocardial T2-relaxation times were normal at 49 ms. Delayed Enhancement: Faint mid wall LGE is present in the basal septum which is a non-specific finding in dilated cardiomyopathy. RV insertion LGE is present which is also a non-specific finding. Tiny, subendocardial LGE (<50%) is present in the basal anterolateral segment, possibly represents embolic infarct, and this area is viable. Extracardiac structures: No significant findings IMPRESSION: 1. The left ventricle is severely dilated with normal wall thickness. Systolic function was severely reduced, LVEF 16%. Global hypokinesis. 2. The right ventricle severely dilated with severely reduced function, RVEF 29%. 3. Mild mitral valve regurgitation, RF 10%. 4. Faint mid wall LGE is present in the basal septum which is a non-specific finding in non-ischemic cardiomyopathy. RV insertion LGE is present which is nonspecific. Tiny, subendocardial LGE (<50%) is present in the  basal anterolateral segment, possibly represents embolic infarct, and this area is viable. Overall, findings are consistent with a nonischemic cardiomyopathy. No evidence of infiltrative disorder. Darryle Decent, MD Electronically Signed   By: Darryle Decent M.D.   On: 07/01/2024 16:38     Medications:     Scheduled Medications:   atorvastatin   10 mg Oral Daily   empagliflozin   10 mg Oral Daily   enoxaparin  (LOVENOX ) injection  40 mg Subcutaneous Q24H   insulin  aspart  0-5 Units Subcutaneous QHS   insulin  aspart  0-9 Units Subcutaneous TID WC   irbesartan   300 mg Oral Daily   isosorbide -hydrALAZINE   1 tablet Oral TID   potassium chloride   40 mEq Oral Q4H    Infusions:  sodium chloride  10 mL/hr at 07/02/24 0618    PRN Medications: acetaminophen  **OR** acetaminophen , melatonin    Patient Profile  Mr. Zaidan is a very pleasant 69 year old male with history of hypertension, hyperlipidemia, type 2 diabetes, osteoarthritis currently admitted with acute systolic heart failure.    Assessment/Plan  Acute systolic heart failure - Echo showed EF <20%, LV with GHK, LV severely dilated, GIIDD, RV mod reduced, mod elevated PASP, LA/RA severely dilated, mild MR. TSH ok. HIV NR.  - NYHA IV on admission - Unclear etiology. Need to rule out iCM. HTN CM?, RBBB CM? -Creatinine up a little today suspect over diuresed.  Stop spiro. Lasix  stopped yesterday. Supp K.  - Continue Irbesartan  300 mg daily. Allergy to lisinopril, would avoid Entresto.  - Hold BB with volume overload (on coreg  at home).  -CMRI- Biventricular HF. NICM  - Discussed with Dr Gardenia. Plan for RHC only today. I discussed with him and his wife.    RBBB - noted recently on echo - present on EKG   HTN - Elevated. Continue  bidil  1 tab TID + irbesartan  300 mg daily. - For now stopping spiro.  -Avoid hypotension with AKI.    HLD - LDL 6/25 46 - Continue statin  6. AKI -Followed by Dr Tobie. Creatinine baseline  -Creatinine trending up.  -Hold lasix  . Hold spiro.   Plan RHC today.    Length of Stay: 3  Roniqua Kintz, NP  07/02/2024, 7:59 AM  Advanced Heart Failure Team Pager [phone] (M-F; 7a - 5p)  Please contact [facility] for night-coverage after hours (5p -7a ) and weekends on [email or web address]

## 2024-07-02 NOTE — Assessment & Plan Note (Addendum)
 Serum iron  31, transferrin saturation 10, ferritin 26 and TIBC 301.  Hgb 11.2 09/19 IV iron  sucrose 500 mg

## 2024-07-02 NOTE — Progress Notes (Signed)
   07/02/24 2237  BiPAP/CPAP/SIPAP  BiPAP/CPAP/SIPAP Pt Type Adult  BiPAP/CPAP/SIPAP Resmed  Mask Type Nasal mask  IPAP 20 cmH20  EPAP 4 cmH2O  FiO2 (%) 21 %  Patient Home Machine Yes  Safety Check Completed by RT for Home Unit Yes, no issues noted  Patient Home Mask Yes  Patient Home Tubing Yes  Device Plugged into RED Power Outlet Yes  BiPAP/CPAP /SiPAP Vitals  Pulse Rate 91  Resp 18  Bilateral Breath Sounds Clear;Diminished

## 2024-07-03 DIAGNOSIS — E1169 Type 2 diabetes mellitus with other specified complication: Secondary | ICD-10-CM | POA: Diagnosis not present

## 2024-07-03 DIAGNOSIS — N1831 Chronic kidney disease, stage 3a: Secondary | ICD-10-CM | POA: Diagnosis not present

## 2024-07-03 DIAGNOSIS — I5023 Acute on chronic systolic (congestive) heart failure: Secondary | ICD-10-CM | POA: Diagnosis not present

## 2024-07-03 DIAGNOSIS — I5082 Biventricular heart failure: Secondary | ICD-10-CM | POA: Diagnosis not present

## 2024-07-03 DIAGNOSIS — D509 Iron deficiency anemia, unspecified: Secondary | ICD-10-CM

## 2024-07-03 DIAGNOSIS — I10 Essential (primary) hypertension: Secondary | ICD-10-CM | POA: Diagnosis not present

## 2024-07-03 LAB — BASIC METABOLIC PANEL WITH GFR
Anion gap: 12 (ref 5–15)
BUN: 24 mg/dL — ABNORMAL HIGH (ref 8–23)
CO2: 29 mmol/L (ref 22–32)
Calcium: 8.4 mg/dL — ABNORMAL LOW (ref 8.9–10.3)
Chloride: 101 mmol/L (ref 98–111)
Creatinine, Ser: 1.56 mg/dL — ABNORMAL HIGH (ref 0.61–1.24)
GFR, Estimated: 48 mL/min — ABNORMAL LOW (ref >60)
Glucose, Bld: 98 mg/dL (ref 70–99)
Potassium: 3 mmol/L — ABNORMAL LOW (ref 3.5–5.1)
Sodium: 142 mmol/L (ref 135–145)

## 2024-07-03 LAB — GLUCOSE, CAPILLARY: Glucose-Capillary: 106 mg/dL — ABNORMAL HIGH (ref 70–99)

## 2024-07-03 LAB — MAGNESIUM: Magnesium: 1.8 mg/dL (ref 1.7–2.4)

## 2024-07-03 MED ORDER — POTASSIUM CHLORIDE CRYS ER 20 MEQ PO TBCR
60.0000 meq | EXTENDED_RELEASE_TABLET | ORAL | Status: AC
  Administered 2024-07-03 (×2): 60 meq via ORAL
  Filled 2024-07-03 (×2): qty 3

## 2024-07-03 MED ORDER — SPIRONOLACTONE 12.5 MG HALF TABLET
12.5000 mg | ORAL_TABLET | Freq: Every day | ORAL | Status: DC
  Administered 2024-07-03 – 2024-07-04 (×2): 12.5 mg via ORAL
  Filled 2024-07-03 (×3): qty 1

## 2024-07-03 MED ORDER — FUROSEMIDE 10 MG/ML IJ SOLN: 40.0000 mg | Freq: Two times a day (BID) | INTRAMUSCULAR | Status: DC

## 2024-07-03 MED ORDER — POTASSIUM CHLORIDE CRYS ER 20 MEQ PO TBCR
40.0000 meq | EXTENDED_RELEASE_TABLET | ORAL | Status: DC
  Administered 2024-07-03 (×2): 40 meq via ORAL
  Filled 2024-07-03 (×2): qty 2

## 2024-07-03 MED ORDER — FUROSEMIDE 10 MG/ML IJ SOLN
80.0000 mg | Freq: Two times a day (BID) | INTRAMUSCULAR | Status: DC
  Administered 2024-07-03 – 2024-07-04 (×2): 80 mg via INTRAVENOUS
  Filled 2024-07-03 (×2): qty 8

## 2024-07-03 MED ORDER — MAGNESIUM SULFATE 2 GM/50ML IV SOLN
2.0000 g | Freq: Once | INTRAVENOUS | Status: AC
Start: 2024-07-03 — End: 2024-07-03
  Administered 2024-07-03: 2 g via INTRAVENOUS
  Filled 2024-07-03: qty 50

## 2024-07-03 NOTE — Plan of Care (Signed)
  Problem: Nutritional: Goal: Maintenance of adequate nutrition will improve Outcome: Progressing Goal: Progress toward achieving an optimal weight will improve Outcome: Progressing   Problem: Skin Integrity: Goal: Risk for impaired skin integrity will decrease Outcome: Progressing   Problem: Tissue Perfusion: Goal: Adequacy of tissue perfusion will improve Outcome: Progressing   Problem: Activity: Goal: Risk for activity intolerance will decrease Outcome: Progressing   Problem: Nutrition: Goal: Adequate nutrition will be maintained Outcome: Progressing   Problem: Coping: Goal: Level of anxiety will decrease Outcome: Progressing   Problem: Elimination: Goal: Will not experience complications related to bowel motility Outcome: Progressing Goal: Will not experience complications related to urinary retention Outcome: Progressing

## 2024-07-03 NOTE — Evaluation (Signed)
 Occupational Therapy Evaluation Patient Details Name: Jonathan Fowler MRN: [id number] DOB: 1956-03-16 Today's Date: 07/03/2024   History of Present Illness   68 y.o. male presents to [facility]/29/24 from PCP for evaluation of dyspnea, orthopnea, and B LE edema with EKG showing R BBB. Admitted with acute on chronic CHF. 9/19 R cardiac cath. PMHx: HTN, hyperlipidemia, DMT2, vertigo, knee replacement surgery in July 2025     Clinical Impressions Pt admitted based on above, and was seen based on problem list below. PTA pt was independent with ADLs and IADLs. Today pt is ind for ADLs and mobility. Pt able to complete LB dressing, toileting, and grooming tasks ind. Pt able to mobilize in room independently, good standing and dynamic balance. Simulated 2 step entry, pt ind. Pt reporting at baseline for ADLs and mobility, no follow up OT or DME needs. Educated pt on precautions following RHC, pt verbalizing understanding. All education complete, no further acute needs. OT is signing off.         If plan is discharge home, recommend the following:   Assistance with cooking/housework     Functional Status Assessment   Patient has not had a recent decline in their functional status     Equipment Recommendations   None recommended by OT      Precautions/Restrictions   Precautions Precautions: Fall Recall of Precautions/Restrictions: Intact Restrictions Weight Bearing Restrictions Per Provider Order: No     Mobility Bed Mobility Overal bed mobility: Independent     General bed mobility comments: HOB elevated    Transfers Overall transfer level: Independent Equipment used: None       General transfer comment: No AD, no LOB or sway      Balance Overall balance assessment: Independent         ADL either performed or assessed with clinical judgement   ADL Overall ADL's : Independent;At baseline       General ADL Comments: Pt completing toileting, grooming,  and LB dressing at ind     Vision Baseline Vision/History: 0 No visual deficits Patient Visual Report: No change from baseline Vision Assessment?: No apparent visual deficits            Pertinent Vitals/Pain Pain Assessment Pain Assessment: No/denies pain     Extremity/Trunk Assessment Upper Extremity Assessment Upper Extremity Assessment: Overall WFL for tasks assessed   Lower Extremity Assessment Lower Extremity Assessment: Overall WFL for tasks assessed   Cervical / Trunk Assessment Cervical / Trunk Assessment: Normal   Communication Communication Communication: No apparent difficulties   Cognition Arousal: Alert Behavior During Therapy: WFL for tasks assessed/performed Cognition: No apparent impairments     Following commands: Intact       Cueing  General Comments   Cueing Techniques: Verbal cues  VSS on RA, reviewed precautions post RHC           Home Living Family/patient expects to be discharged to:: Private residence Living Arrangements: Spouse/significant other Available Help at Discharge: Family Type of Home: House Home Access: Stairs to enter Secretary/administrator of Steps: 2 Entrance Stairs-Rails: None Home Layout: One level     Bathroom Shower/Tub: Producer, television/film/video: Standard     Home Equipment: Agricultural consultant (2 wheels);Hand held shower head;Grab bars - toilet;Grab bars - tub/shower          Prior Functioning/Environment Prior Level of Function : Independent/Modified Independent     Mobility Comments: Ind ADLs Comments: Ind    OT Problem List: Cardiopulmonary status  limiting activity        OT Goals(Current goals can be found in the care plan section)   Acute Rehab OT Goals Patient Stated Goal: To go home OT Goal Formulation: All assessment and education complete, DC therapy Time For Goal Achievement: 07/17/24 Potential to Achieve Goals: Good   AM-PAC OT 6 Clicks Daily Activity     Outcome  Measure Help from another person eating meals?: None Help from another person taking care of personal grooming?: None Help from another person toileting, which includes using toliet, bedpan, or urinal?: None Help from another person bathing (including washing, rinsing, drying)?: None Help from another person to put on and taking off regular upper body clothing?: None Help from another person to put on and taking off regular lower body clothing?: None 6 Click Score: 24   End of Session Nurse Communication: Mobility status  Activity Tolerance: Patient tolerated treatment well Patient left: in bed;with call bell/phone within reach  OT Visit Diagnosis: Muscle weakness (generalized) (M62.81)                Time: 8497-8487 OT Time Calculation (min): 10 min Charges:  OT General Charges $OT Visit: 1 Visit OT Evaluation $OT Eval Low Complexity: 1 Low  Adrianne BROCKS, OT  Acute Rehabilitation Services Office [phone] Secure chat preferred   Adrianne GORMAN Savers 07/03/2024, 3:19 PM

## 2024-07-03 NOTE — Progress Notes (Signed)
 PT Cancellation Note  Patient Details Name: BIRT REINOSO MRN: [id number] DOB: 08-26-1956   Cancelled Treatment:    Reason Eval/Treat Not Completed: PT screened, no needs identified, will sign off (Per OT, pt at baseline and able to ambulate independently. No acute PT needs identified with acute PT signing off. Please re-consult if new needs arise.)  Kate ORN, PT, DPT Secure Chat Preferred  Rehab Office [phone]  Kate BRAVO Wendolyn 07/03/2024, 3:14 PM

## 2024-07-03 NOTE — Progress Notes (Signed)
 Advanced Heart Failure Rounding Note  Cardiologist: None  Chief Complaint: Acute Heart Failure  Subjective:   9/17- Diuresed with IV lasix .  9/18- Lasix  held due to creatinine bump. CMRI - NICM Biventricular HF.   - Feels much better today; dyspnea has resolved.  - 5.2L urine output, -4.4  Objective:   Weight Range: 102.4 kg Body mass index is 31.49 kg/m.   Vital Signs:   Temp:  [97.6 F (36.4 C)-98.8 F (37.1 C)] 98.1 F (36.7 C) (09/20 1145) Pulse Rate:  [76-91] 82 (09/20 1145) Resp:  [17-19] 17 (09/20 1145) BP: (116-159)/(63-106) 116/63 (09/20 1145) SpO2:  [90 %-97 %] 92 % (09/20 1145) FiO2 (%):  [21 %] 21 % (09/19 2237) Weight:  [102.4 kg] 102.4 kg (09/20 0500) Last BM Date : 07/01/24  Weight change: Filed Weights   06/30/24 1428 07/02/24 0500 07/03/24 0500  Weight: 109.6 kg 106.4 kg 102.4 kg    Intake/Output:   Intake/Output Summary (Last 24 hours) at 07/03/2024 1330 Last data filed at 07/03/2024 1148 Gross per 24 hour  Intake 720 ml  Output 7650 ml  Net -6930 ml      Physical Exam   General:   No resp difficulty Neck: no JVD.  Cor: Regular rate & rhythm.  Lungs: clear Abdomen: soft, nontender, nondistended.  Extremities: no  edema Neuro: alert & oriented x3    Telemetry  SR 70-80s LBBB with PVCs.   EKG   N/A  Labs    CBC Recent Labs    07/02/24 0919  HGB 11.2*  11.2*  HCT 33.0*  33.0*   Basic Metabolic Panel Recent Labs    90/80/74 0255 07/02/24 0919 07/03/24 0222  NA 139 141  141 142  K 3.3* 3.2*  3.2* 3.0*  CL 104  --  101  CO2 26  --  29  GLUCOSE 116*  --  98  BUN 27*  --  24*  CREATININE 1.72*  --  1.56*  CALCIUM  8.6*  --  8.4*  MG 2.0  --  1.8    BNP (last 3 results) Recent Labs    06/29/24 1412  BNP 1,746.1*    Recent Labs    07/01/24 0302  TSH 3.484    Other results:   Imaging    No results found.    Medications:     Scheduled Medications:  atorvastatin   10 mg Oral Daily    empagliflozin   10 mg Oral Daily   enoxaparin  (LOVENOX ) injection  40 mg Subcutaneous Q24H   furosemide   80 mg Intravenous BID   irbesartan   300 mg Oral Daily   isosorbide -hydrALAZINE   1.5 tablet Oral TID   potassium chloride   40 mEq Oral Q4H   spironolactone   12.5 mg Oral Daily    Infusions:  iron  sucrose (VENOFER ) 500 mg in sodium chloride  0.9 % 250 mL IVPB 500 mg (07/03/24 1022)    PRN Medications: acetaminophen  **OR** acetaminophen , ALPRAZolam     Patient Profile  Mr. Bion is a very pleasant 68 year old male with history of hypertension, hyperlipidemia, type 2 diabetes, osteoarthritis currently admitted with acute systolic heart failure.   Assessment/Plan  Acute systolic heart failure - Echo showed EF <20%, LV with GHK, LV severely dilated, GIIDD, RV mod reduced, mod elevated PASP, LA/RA severely dilated, mild MR. TSH ok. HIV NR.  - NYHA IV on admission - CMR on 07/01/24 with severely dilated right ventricle with severely reduced function.  LVEF 15%. -Right heart cath on 07/02/2024 with severely  elevated biventricular filling pressures and reduced cardiac index.  Due to elevated serum creatinine left heart cath deferred. -5 L urine output yesterday, JVP down to 9 to 10 cm today with significant improvement in dyspnea.  Patient reports he feels better than he has in months.  Serum creatinine down to 1.56 -Received IV Lasix  80 mg this morning with 3 L urine output thus far. -Decrease p.m. dose to 40 mg -Blood pressure 116/63 after increasing BiDil .  Will continue to monitor.  Continue irbesartan  -Plan for repeat right and left heart catheterization on Monday, 07/05/2024.  Although his cardiac MRI is nonischemic he has significant conduction system disease with a QRS duration of 194 ms, need to rule out multivessel CAD.  Will repeat EKG prior to discharge.  There is some data for biventricular pacing with excessively delayed ventricular conduction.   2. HTN - Elevated. Continue  bidil   1 tab TID + irbesartan  300 mg daily. - For now stopping spiro.  -Avoid hypotension with AKI.    3. HLD - LDL 6/25 46 - Continue statin  4.  AKI -Followed by Dr Tobie.  - Serum creatinine down to 1.56 today.   Length of Stay: 4  Marlia Schewe, DO  07/03/2024, 1:30 PM  Advanced Heart Failure Team Pager [phone] (M-F; 7a - 5p)  Please contact [facility] for night-coverage after hours (5p -7a ) and weekends on [email or web address]

## 2024-07-03 NOTE — Progress Notes (Addendum)
 Progress Note   Patient: Jonathan Fowler FMW:[id number] DOB: [birth year] DOA: 06/29/2024     4 DOS: the patient was seen and examined on 07/03/2024   Brief hospital course: Jonathan Fowler was admitted to the hospital with the working diagnosis of heart failure decompensation.   68 yo male with past medical history of hypertension, hyperlipidemia, and T2DM who presented with dyspnea. Reported one week of worsening dyspnea, orthopnea, PND and lower extremity edema. On the day of admission he was evaluated by his primary care provider, found him volume overloaded and referred him to the hospital. On his initial physical examination his blood pressure was 165/114, HR 86, RR 18 and 02 saturation 97% Lungs with bilateral rales with no wheezing or rhonchi, heart with S1 and S2 present and regular with no gallops or murmurs, abdomen with no distention, positive lower extremity edema pitting +++.   Na 138, K 3,8 Cl 105 bicarbonate 21, glucose 131, bun 29 cr 1.36  AST 29 ALT 27  BNP 1,746 High sensitive troponin 48 and 44  Wbc 8,3 hgb 12.1 plt 230   Chest radiograph with mild cardiomegaly, with bilateral hilar vascular congestion, with no effusions or infiltrates.   EKG 91 bpm, left axis deviation, left anterior fascicular block, right bundle branch block, qtc 546, sinus rhythm, with J point elevation II, III, aVF, V4 to V6, positive LVH, with no significant ST segment or T wave changes.   Patient placed on IV furosemide  for diuresis.  Echocardiogram with reduced LV systolic function with biventricular failure.  09/19 cardiac catheterization with elevated filling pressures.  09/20 improving volume status.   Assessment and Plan: * Acute on chronic systolic CHF (congestive heart failure) (HCC) Echocardiogram with reduced LV systolic function with EF <20%, global hypokinesis, severe dilatated LV cavity, grade II diastolic dysfunction (pseudo normalization EA), RV systolic function with moderate  reduction, LA and RA with severe dilatation, RVSP 46,6 mmHg, mild TR and mild MR.   09/19 cardiac catheterization  RA 9  RV 64/10 PA 58/29 mean 40  PCWP mean 27 Cardiac output 5,76 L/min and index 2.5 L/min/m2(Fick) Cardiac output 4.46 L/min and index 2 L/min/m2 (thermodilution)  PVR 2.25 to 2.9 Wood Units  Elevated filling pressures, with pre and post capillary pulmonary hypertension.   Cardiac MRI  Reduced LV systolic function 16%, global hypokinesis, RV with severe dilatation with EF 29%, non ischemic cardiomyopathy.   Urine output 5,200 ml Systolic blood pressure 140  mmHg.   Plan to continue diuresis with furosemide  80 mg IV bid SGLT 2 inh, spironolactone  and afterload reduction with irbesartan , Bidil .  Pending coronary angiography when renal function more stable.   Essential hypertension Continue blood pressure control with irbesartan , bidil , and aggressive diuresis with furosemide .  Spironolactone .   Chronic kidney disease, stage 3a (HCC) AKI Hypokalemia. Hypomagnesemia   Today renal function with serum cr trending down at 1.56 with K at 3,0 and serum bicarbonate at 29  Na 142 Mg 1,8   Plan to continue diuresis with furosemide , spironolactone  and SGLT 2 inh Add 40 meq Kcl x3 and 2 g Mag sulfate.  Follow up renal function and electrolytes in am.   Type 2 diabetes mellitus with hyperlipidemia (HCC) Fasting glucose today is 98  Glucose has been stable, will hold on further insulin  and will check capillary glucose as needed.  Hgb A1c is 5.5   Continue statin therapy   Iron  deficiency anemia Serum iron  31, transferrin saturation 10, ferritin 26 and TIBC 301.  Hgb  11.2 09/19 IV iron  sucrose 500 mg   Obesity, class 1 Calculated BMI is 33.6         Subjective: Patient with no chest pain, dyspnea and edema have been improving, he was able to sleep well last night.   Physical Exam: Vitals:   07/02/24 2345 07/03/24 0458 07/03/24 0500 07/03/24 0759  BP: (!)  141/81 (!) 140/87  (!) 147/88  Pulse: 76 78  87  Resp:  18  19  Temp: 98.8 F (37.1 C) 98.1 F (36.7 C)  98.4 F (36.9 C)  TempSrc: Oral Oral  Oral  SpO2: 90% 95%  92%  Weight:   102.4 kg   Height:       Neurology awake and alert ENT with mild pallor Cardiovascular with S1 and S2 present and regular with no gallops or rubs, positive systolic murmur at the left lower sternal border Mild JVD Respiratory with mild rales at bases with no wheezing or rhonchi  Abdomen with no distention, soft  Lower extremity edema + unna boots in place  Data Reviewed:    Family Communication: no family at the bedside   Disposition: Status is: Inpatient Remains inpatient appropriate because: IV diuresis   Planned Discharge Destination: Home     Author: Elidia Toribio Furnace, MD 07/03/2024 10:08 AM  For on call review [email or web address].

## 2024-07-04 ENCOUNTER — Encounter (HOSPITAL_COMMUNITY): Payer: Self-pay | Admitting: Cardiology

## 2024-07-04 DIAGNOSIS — I5023 Acute on chronic systolic (congestive) heart failure: Secondary | ICD-10-CM | POA: Diagnosis not present

## 2024-07-04 DIAGNOSIS — E1169 Type 2 diabetes mellitus with other specified complication: Secondary | ICD-10-CM | POA: Diagnosis not present

## 2024-07-04 DIAGNOSIS — N1831 Chronic kidney disease, stage 3a: Secondary | ICD-10-CM | POA: Diagnosis not present

## 2024-07-04 DIAGNOSIS — I10 Essential (primary) hypertension: Secondary | ICD-10-CM | POA: Diagnosis not present

## 2024-07-04 LAB — BASIC METABOLIC PANEL WITH GFR
Anion gap: 9 (ref 5–15)
BUN: 21 mg/dL (ref 8–23)
CO2: 30 mmol/L (ref 22–32)
Calcium: 8.6 mg/dL — ABNORMAL LOW (ref 8.9–10.3)
Chloride: 102 mmol/L (ref 98–111)
Creatinine, Ser: 1.49 mg/dL — ABNORMAL HIGH (ref 0.61–1.24)
GFR, Estimated: 51 mL/min — ABNORMAL LOW (ref >60)
Glucose, Bld: 100 mg/dL — ABNORMAL HIGH (ref 70–99)
Potassium: 3.7 mmol/L (ref 3.5–5.1)
Sodium: 141 mmol/L (ref 135–145)

## 2024-07-04 LAB — MAGNESIUM: Magnesium: 2 mg/dL (ref 1.7–2.4)

## 2024-07-04 MED ORDER — POTASSIUM CHLORIDE CRYS ER 20 MEQ PO TBCR
40.0000 meq | EXTENDED_RELEASE_TABLET | Freq: Once | ORAL | Status: AC
  Administered 2024-07-04: 40 meq via ORAL
  Filled 2024-07-04: qty 2

## 2024-07-04 MED ORDER — FUROSEMIDE 10 MG/ML IJ SOLN: 40.0000 mg | Freq: Two times a day (BID) | INTRAMUSCULAR | Status: DC

## 2024-07-04 MED ORDER — ASPIRIN 81 MG PO CHEW
81.0000 mg | CHEWABLE_TABLET | ORAL | Status: AC
  Administered 2024-07-05: 81 mg via ORAL
  Filled 2024-07-04: qty 1

## 2024-07-04 MED ORDER — FREE WATER
200.0000 mL | Freq: Once | Status: AC
  Administered 2024-07-05: 200 mL via ORAL

## 2024-07-04 NOTE — Progress Notes (Signed)
 Mobility Specialist Progress Note:    07/04/24 1147  Mobility  Activity Ambulated with assistance  Level of Assistance Independent after set-up  Assistive Device None  Distance Ambulated (ft) 200 ft  Activity Response Tolerated well  Mobility Referral Yes  Mobility visit 1 Mobility  Mobility Specialist Start Time (ACUTE ONLY) 0953  Mobility Specialist Stop Time (ACUTE ONLY) 1003  Mobility Specialist Time Calculation (min) (ACUTE ONLY) 10 min   Received pt in chair having no complaints and agreeable to mobility. Pt was asymptomatic throughout ambulation and returned to room w/o fault. Left in chair w/ call bell in reach and all needs met.   Thersia Minder Mobility Specialist  Please contact vis Secure Chat or  Rehab Office [phone]

## 2024-07-04 NOTE — Plan of Care (Signed)
  Problem: Fluid Volume: Goal: Ability to maintain a balanced intake and output will improve Outcome: Progressing   Problem: Health Behavior/Discharge Planning: Goal: Ability to identify and utilize available resources and services will improve Outcome: Progressing   Problem: Tissue Perfusion: Goal: Adequacy of tissue perfusion will improve Outcome: Progressing   Problem: Education: Goal: Knowledge of General Education information will improve Description: Including pain rating scale, medication(s)/side effects and non-pharmacologic comfort measures Outcome: Progressing   Problem: Clinical Measurements: Goal: Diagnostic test results will improve Outcome: Progressing Goal: Respiratory complications will improve Outcome: Progressing   Problem: Activity: Goal: Risk for activity intolerance will decrease Outcome: Progressing   Problem: Nutrition: Goal: Adequate nutrition will be maintained Outcome: Progressing   Problem: Coping: Goal: Level of anxiety will decrease Outcome: Progressing   Problem: Elimination: Goal: Will not experience complications related to bowel motility Outcome: Progressing Goal: Will not experience complications related to urinary retention Outcome: Progressing   Problem: Pain Managment: Goal: General experience of comfort will improve and/or be controlled Outcome: Progressing

## 2024-07-04 NOTE — H&P (View-Only) (Signed)
 Advanced Heart Failure Rounding Note  Cardiologist: None  Chief Complaint: Acute Heart Failure  Subjective:   9/17- Diuresed with IV lasix .  9/18- Lasix  held due to creatinine bump. CMRI - NICM Biventricular HF.   - Feels well today; no acute events overnight.  - 5.1L urine output yesterday   Objective:   Weight Range: 100.8 kg Body mass index is 31 kg/m.   Vital Signs:   Temp:  [97.6 F (36.4 C)-98.2 F (36.8 C)] 97.6 F (36.4 C) (09/21 1207) Pulse Rate:  [71-96] 96 (09/21 1207) Resp:  [16-20] 20 (09/21 0522) BP: (124-147)/(72-95) 129/79 (09/21 1207) SpO2:  [92 %-96 %] 95 % (09/21 1207) Weight:  [100.8 kg] 100.8 kg (09/21 0540) Last BM Date : 07/01/24  Weight change: Filed Weights   07/02/24 0500 07/03/24 0500 07/04/24 0540  Weight: 106.4 kg 102.4 kg 100.8 kg    Intake/Output:   Intake/Output Summary (Last 24 hours) at 07/04/2024 1307 Last data filed at 07/04/2024 0737 Gross per 24 hour  Intake 995 ml  Output 2125 ml  Net -1130 ml      Physical Exam   Vitals:   07/04/24 0733 07/04/24 1207  BP: (!) 147/86 129/79  Pulse: 82 96  Resp:    Temp: 97.7 F (36.5 C) 97.6 F (36.4 C)  SpO2: 95% 95%   GENERAL: NAD Lungs- normal work of breathing CARDIAC:  JVP:8 cm          Normal rate with regular rhythm. no murmur.  Pulses 2+. No edema.  ABDOMEN: Soft, non-tender, non-distended.  EXTREMITIES: Warm and well perfused.  NEUROLOGIC: No obvious FND    Telemetry  SR 70-80s LBBB with PVCs.   EKG   N/A  Labs    CBC Recent Labs    07/02/24 0919  HGB 11.2*  11.2*  HCT 33.0*  33.0*   Basic Metabolic Panel Recent Labs    90/79/74 0222 07/04/24 0142  NA 142 141  K 3.0* 3.7  CL 101 102  CO2 29 30  GLUCOSE 98 100*  BUN 24* 21  CREATININE 1.56* 1.49*  CALCIUM  8.4* 8.6*  MG 1.8 2.0    BNP (last 3 results) Recent Labs    06/29/24 1412  BNP 1,746.1*    No results for input(s): TSH, T4TOTAL, T3FREE, THYROIDAB in the last 72  hours.  Invalid input(s): FREET3   Other results:   Imaging    No results found.    Medications:     Scheduled Medications:  atorvastatin   10 mg Oral Daily   empagliflozin   10 mg Oral Daily   enoxaparin  (LOVENOX ) injection  40 mg Subcutaneous Q24H   furosemide   40 mg Intravenous Q12H   irbesartan   300 mg Oral Daily   isosorbide -hydrALAZINE   1.5 tablet Oral TID   spironolactone   12.5 mg Oral Daily    Infusions:    PRN Medications: acetaminophen  **OR** acetaminophen , ALPRAZolam     Patient Profile  Jonathan Fowler is a very pleasant 68 year old male with history of hypertension, hyperlipidemia, type 2 diabetes, osteoarthritis currently admitted with acute systolic heart failure.   Assessment/Plan  Acute systolic heart failure - Echo showed EF <20%, LV with GHK, LV severely dilated, GIIDD, RV mod reduced, mod elevated PASP, LA/RA severely dilated, mild MR. TSH ok. HIV NR.  - NYHA IV on admission - CMR on 07/01/24 with severely dilated right ventricle with severely reduced function.  LVEF 15%. -Right heart cath on 07/02/2024 with severely elevated biventricular filling pressures and reduced  cardiac index.  Due to elevated serum creatinine left heart cath deferred. -10L urine output over the past 2 days with improvement in sCr to 1.49; likely approaching euvolemia.  - Continue IV lasix  80mg  x 1 this AM; hold off PM dose.  -Blood pressure 116/63 after increasing BiDil .  Will continue to monitor.  Continue irbesartan  -Plan for repeat right and left heart catheterization on Monday, 07/05/2024.  Although his cardiac MRI is nonischemic he has significant conduction system disease with a QRS duration of 194 ms, need to rule out multivessel CAD.  Will repeat EKG prior to discharge.  There is some data for biventricular pacing with excessively delayed ventricular conduction.   2. HTN - Elevated. Continue  bidil  1 tab TID + irbesartan  300 mg daily. - For now stopping spiro.  -Avoid  hypotension with AKI.    3. HLD - LDL 6/25 46 - Continue statin  4.  AKI -Followed by Dr Tobie.  -Continuing to improve; sCr 1.49 today   Length of Stay: 5  Yamilet Mcfayden, DO  07/04/2024, 1:07 PM  Advanced Heart Failure Team Pager [phone] (M-F; 7a - 5p)  Please contact [facility] for night-coverage after hours (5p -7a ) and weekends on [email or web address]

## 2024-07-04 NOTE — Progress Notes (Signed)
 Advanced Heart Failure Rounding Note  Cardiologist: None  Chief Complaint: Acute Heart Failure  Subjective:   9/17- Diuresed with IV lasix .  9/18- Lasix  held due to creatinine bump. CMRI - NICM Biventricular HF.   - Feels well today; no acute events overnight.  - 5.1L urine output yesterday   Objective:   Weight Range: 100.8 kg Body mass index is 31 kg/m.   Vital Signs:   Temp:  [97.6 F (36.4 C)-98.2 F (36.8 C)] 97.6 F (36.4 C) (09/21 1207) Pulse Rate:  [71-96] 96 (09/21 1207) Resp:  [16-20] 20 (09/21 0522) BP: (124-147)/(72-95) 129/79 (09/21 1207) SpO2:  [92 %-96 %] 95 % (09/21 1207) Weight:  [100.8 kg] 100.8 kg (09/21 0540) Last BM Date : 07/01/24  Weight change: Filed Weights   07/02/24 0500 07/03/24 0500 07/04/24 0540  Weight: 106.4 kg 102.4 kg 100.8 kg    Intake/Output:   Intake/Output Summary (Last 24 hours) at 07/04/2024 1307 Last data filed at 07/04/2024 0737 Gross per 24 hour  Intake 995 ml  Output 2125 ml  Net -1130 ml      Physical Exam   Vitals:   07/04/24 0733 07/04/24 1207  BP: (!) 147/86 129/79  Pulse: 82 96  Resp:    Temp: 97.7 F (36.5 C) 97.6 F (36.4 C)  SpO2: 95% 95%   GENERAL: NAD Lungs- normal work of breathing CARDIAC:  JVP:8 cm          Normal rate with regular rhythm. no murmur.  Pulses 2+. No edema.  ABDOMEN: Soft, non-tender, non-distended.  EXTREMITIES: Warm and well perfused.  NEUROLOGIC: No obvious FND    Telemetry  SR 70-80s LBBB with PVCs.   EKG   N/A  Labs    CBC Recent Labs    07/02/24 0919  HGB 11.2*  11.2*  HCT 33.0*  33.0*   Basic Metabolic Panel Recent Labs    90/79/74 0222 07/04/24 0142  NA 142 141  K 3.0* 3.7  CL 101 102  CO2 29 30  GLUCOSE 98 100*  BUN 24* 21  CREATININE 1.56* 1.49*  CALCIUM  8.4* 8.6*  MG 1.8 2.0    BNP (last 3 results) Recent Labs    06/29/24 1412  BNP 1,746.1*    No results for input(s): TSH, T4TOTAL, T3FREE, THYROIDAB in the last 72  hours.  Invalid input(s): FREET3   Other results:   Imaging    No results found.    Medications:     Scheduled Medications:  atorvastatin   10 mg Oral Daily   empagliflozin   10 mg Oral Daily   enoxaparin  (LOVENOX ) injection  40 mg Subcutaneous Q24H   furosemide   40 mg Intravenous Q12H   irbesartan   300 mg Oral Daily   isosorbide -hydrALAZINE   1.5 tablet Oral TID   spironolactone   12.5 mg Oral Daily    Infusions:    PRN Medications: acetaminophen  **OR** acetaminophen , ALPRAZolam     Patient Profile  Mr. Marquee is a very pleasant 68 year old male with history of hypertension, hyperlipidemia, type 2 diabetes, osteoarthritis currently admitted with acute systolic heart failure.   Assessment/Plan  Acute systolic heart failure - Echo showed EF <20%, LV with GHK, LV severely dilated, GIIDD, RV mod reduced, mod elevated PASP, LA/RA severely dilated, mild MR. TSH ok. HIV NR.  - NYHA IV on admission - CMR on 07/01/24 with severely dilated right ventricle with severely reduced function.  LVEF 15%. -Right heart cath on 07/02/2024 with severely elevated biventricular filling pressures and reduced  cardiac index.  Due to elevated serum creatinine left heart cath deferred. -10L urine output over the past 2 days with improvement in sCr to 1.49; likely approaching euvolemia.  - Continue IV lasix  80mg  x 1 this AM; hold off PM dose.  -Blood pressure 116/63 after increasing BiDil .  Will continue to monitor.  Continue irbesartan  -Plan for repeat right and left heart catheterization on Monday, 07/05/2024.  Although his cardiac MRI is nonischemic he has significant conduction system disease with a QRS duration of 194 ms, need to rule out multivessel CAD.  Will repeat EKG prior to discharge.  There is some data for biventricular pacing with excessively delayed ventricular conduction.   2. HTN - Elevated. Continue  bidil  1 tab TID + irbesartan  300 mg daily. - For now stopping spiro.  -Avoid  hypotension with AKI.    3. HLD - LDL 6/25 46 - Continue statin  4.  AKI -Followed by Dr Tobie.  -Continuing to improve; sCr 1.49 today   Length of Stay: 5  Yamilet Mcfayden, DO  07/04/2024, 1:07 PM  Advanced Heart Failure Team Pager [phone] (M-F; 7a - 5p)  Please contact [facility] for night-coverage after hours (5p -7a ) and weekends on [email or web address]

## 2024-07-04 NOTE — Progress Notes (Addendum)
 Progress Note   Patient: Jonathan Fowler FMW:[id number] DOB: [birth year] DOA: 06/29/2024     5 DOS: the patient was seen and examined on 07/04/2024   Brief hospital course: Mr. Weldy was admitted to the hospital with the working diagnosis of heart failure decompensation.   68 yo male with past medical history of hypertension, hyperlipidemia, and T2DM who presented with dyspnea. Reported one week of worsening dyspnea, orthopnea, PND and lower extremity edema. On the day of admission he was evaluated by his primary care provider, found him volume overloaded and referred him to the hospital. On his initial physical examination his blood pressure was 165/114, HR 86, RR 18 and 02 saturation 97% Lungs with bilateral rales with no wheezing or rhonchi, heart with S1 and S2 present and regular with no gallops or murmurs, abdomen with no distention, positive lower extremity edema pitting +++.   Na 138, K 3,8 Cl 105 bicarbonate 21, glucose 131, bun 29 cr 1.36  AST 29 ALT 27  BNP 1,746 High sensitive troponin 48 and 44  Wbc 8,3 hgb 12.1 plt 230   Chest radiograph with mild cardiomegaly, with bilateral hilar vascular congestion, with no effusions or infiltrates.   EKG 91 bpm, left axis deviation, left anterior fascicular block, right bundle branch block, qtc 546, sinus rhythm, with J point elevation II, III, aVF, V4 to V6, positive LVH, with no significant ST segment or T wave changes.   Patient placed on IV furosemide  for diuresis.  Echocardiogram with reduced LV systolic function with biventricular failure.  09/19 cardiac catheterization with elevated filling pressures.  09/20 improving volume status.  09/21 responding well to diuresis, pending left heart catheterization.   Assessment and Plan: * Acute on chronic systolic CHF (congestive heart failure) (HCC) Echocardiogram with reduced LV systolic function with EF <20%, global hypokinesis, severe dilatated LV cavity, grade II diastolic  dysfunction (pseudo normalization EA), RV systolic function with moderate reduction, LA and RA with severe dilatation, RVSP 46,6 mmHg, mild TR and mild MR.   09/19 cardiac catheterization  RA 9  RV 64/10 PA 58/29 mean 40  PCWP mean 27 Cardiac output 5,76 L/min and index 2.5 L/min/m2(Fick) Cardiac output 4.46 L/min and index 2 L/min/m2 (thermodilution)  PVR 2.25 to 2.9 Wood Units  Elevated filling pressures, with pre and post capillary pulmonary hypertension.   Cardiac MRI  Reduced LV systolic function 16%, global hypokinesis, RV with severe dilatation with EF 29%, non ischemic cardiomyopathy.   Urine output 5,175 ml Systolic blood pressure 120 to 140  mmHg.   Patient had 80 mg IV furosemide  this am, will change to 40 mg IV per Cardiology recommendations. Since renal function is stable and improving will keep low dose spironolactone .   SGLT 2 inh, and afterload reduction with irbesartan , Bidil .  Pending coronary angiography when renal function more stable.   Essential hypertension Continue blood pressure control with irbesartan , bidil , and diuresis with furosemide .  Spironolactone .   Chronic kidney disease, stage 3a (HCC) AKI Hypokalemia. Hypomagnesemia   Renal function with serum cr trending down to 1.49 with K at 3,7 and serum bicarbonate at 30  Na 141 and Mg 2.0   Add 40 meq Kcl today.  Plan to continue diuresis with furosemide , spironolactone  and SGLT 2 inh Follow up renal function and electrolytes in am.   Type 2 diabetes mellitus with hyperlipidemia (HCC) Fasting glucose today is 100 mg/dl  Off insulin  therapy Hgb A1c is 5.5   Continue statin therapy   Iron  deficiency anemia  Serum iron  31, transferrin saturation 10, ferritin 26 and TIBC 301.  Hgb 11.2 09/19 IV iron  sucrose 500 mg   Obesity, class 1 Calculated BMI is 33.6      Subjective: Patient feeling better, he has been ambulating, no chest pain, dyspnea, edema with significant improvement   Physical  Exam: Vitals:   07/04/24 0017 07/04/24 0522 07/04/24 0540 07/04/24 0733  BP: 124/75 (!) 145/95  (!) 147/86  Pulse: 71 80  82  Resp: 16 20    Temp: 98.1 F (36.7 C) 98 F (36.7 C)  97.7 F (36.5 C)  TempSrc: Oral Oral  Oral  SpO2: 93% 96%  95%  Weight:   100.8 kg   Height:       Neurology awake and alert ENT with mild pallor with no icterus Cardiovascular with S1 and S2 present and regular with no gallops, rubs or murmurs No JVD Respiratory with no rales or wheezing, no rhonchi  Abdomen with no distention  Trace lower extremity edema, unna boots in place  Data Reviewed:    Family Communication: no family at the bedside   Disposition: Status is: Inpatient Remains inpatient appropriate because: pending cardiac catheterization   Planned Discharge Destination: Home     Author: Elidia Toribio Furnace, MD 07/04/2024 9:35 AM  For on call review [email or web address].

## 2024-07-04 NOTE — Progress Notes (Signed)
   07/04/24 2151  BiPAP/CPAP/SIPAP  BiPAP/CPAP/SIPAP Pt Type Adult  BiPAP/CPAP/SIPAP Resmed (Pt places himself on home unit. RT will assist as needed.)  Mask Type Nasal mask  FiO2 (%) 21 %  Patient Home Machine Yes  Safety Check Completed by RT for Home Unit Yes, no issues noted  Patient Home Mask Yes  Patient Home Tubing Yes  Device Plugged into RED Power Outlet Yes

## 2024-07-05 ENCOUNTER — Encounter (HOSPITAL_COMMUNITY)
Admission: EM | Disposition: A | Discharge status: Home / Self Care | Payer: Self-pay | Source: Ambulatory Visit | Attending: Internal Medicine

## 2024-07-05 DIAGNOSIS — E1169 Type 2 diabetes mellitus with other specified complication: Secondary | ICD-10-CM | POA: Diagnosis not present

## 2024-07-05 DIAGNOSIS — I5023 Acute on chronic systolic (congestive) heart failure: Secondary | ICD-10-CM | POA: Diagnosis not present

## 2024-07-05 DIAGNOSIS — N1831 Chronic kidney disease, stage 3a: Secondary | ICD-10-CM | POA: Diagnosis not present

## 2024-07-05 DIAGNOSIS — I2510 Atherosclerotic heart disease of native coronary artery without angina pectoris: Secondary | ICD-10-CM | POA: Diagnosis not present

## 2024-07-05 DIAGNOSIS — I10 Essential (primary) hypertension: Secondary | ICD-10-CM | POA: Diagnosis not present

## 2024-07-05 DIAGNOSIS — I428 Other cardiomyopathies: Secondary | ICD-10-CM

## 2024-07-05 HISTORY — PX: RIGHT/LEFT HEART CATH AND CORONARY ANGIOGRAPHY: CATH118266

## 2024-07-05 LAB — POCT I-STAT EG7
Acid-Base Excess: 3 mmol/L — ABNORMAL HIGH (ref 0.0–2.0)
Acid-Base Excess: 3 mmol/L — ABNORMAL HIGH (ref 0.0–2.0)
Bicarbonate: 27.6 mmol/L (ref 20.0–28.0)
Bicarbonate: 28.3 mmol/L — ABNORMAL HIGH (ref 20.0–28.0)
Calcium, Ion: 1.09 mmol/L — ABNORMAL LOW (ref 1.15–1.40)
Calcium, Ion: 1.19 mmol/L (ref 1.15–1.40)
HCT: 35 % — ABNORMAL LOW (ref 39.0–52.0)
HCT: 37 % — ABNORMAL LOW (ref 39.0–52.0)
Hemoglobin: 11.9 g/dL — ABNORMAL LOW (ref 13.0–17.0)
Hemoglobin: 12.6 g/dL — ABNORMAL LOW (ref 13.0–17.0)
O2 Saturation: 66 %
O2 Saturation: 67 %
Potassium: 3 mmol/L — ABNORMAL LOW (ref 3.5–5.1)
Potassium: 3.2 mmol/L — ABNORMAL LOW (ref 3.5–5.1)
Sodium: 142 mmol/L (ref 135–145)
Sodium: 144 mmol/L (ref 135–145)
TCO2: 29 mmol/L (ref 22–32)
TCO2: 30 mmol/L (ref 22–32)
pCO2, Ven: 43.2 mmHg — ABNORMAL LOW (ref 44–60)
pCO2, Ven: 44.1 mmHg (ref 44–60)
pH, Ven: 7.413 (ref 7.25–7.43)
pH, Ven: 7.416 (ref 7.25–7.43)
pO2, Ven: 34 mmHg (ref 32–45)
pO2, Ven: 35 mmHg (ref 32–45)

## 2024-07-05 LAB — BASIC METABOLIC PANEL WITH GFR
Anion gap: 12 (ref 5–15)
BUN: 27 mg/dL — ABNORMAL HIGH (ref 8–23)
CO2: 26 mmol/L (ref 22–32)
Calcium: 8.7 mg/dL — ABNORMAL LOW (ref 8.9–10.3)
Chloride: 102 mmol/L (ref 98–111)
Creatinine, Ser: 1.37 mg/dL — ABNORMAL HIGH (ref 0.61–1.24)
GFR, Estimated: 56 mL/min — ABNORMAL LOW (ref >60)
Glucose, Bld: 93 mg/dL (ref 70–99)
Potassium: 3.2 mmol/L — ABNORMAL LOW (ref 3.5–5.1)
Sodium: 140 mmol/L (ref 135–145)

## 2024-07-05 LAB — MAGNESIUM: Magnesium: 2 mg/dL (ref 1.7–2.4)

## 2024-07-05 SURGERY — RIGHT/LEFT HEART CATH AND CORONARY ANGIOGRAPHY
Anesthesia: LOCAL

## 2024-07-05 MED ORDER — IOHEXOL 350 MG/ML SOLN
INTRAVENOUS | Status: DC | PRN
  Administered 2024-07-05: 45 mL

## 2024-07-05 MED ORDER — SACUBITRIL-VALSARTAN 97-103 MG PO TABS: 1.0000 | ORAL_TABLET | Freq: Two times a day (BID) | ORAL | Status: DC

## 2024-07-05 MED ORDER — SPIRONOLACTONE 25 MG PO TABS: 25.0000 mg | ORAL_TABLET | Freq: Every day | ORAL | Status: DC

## 2024-07-05 MED ORDER — HEPARIN SODIUM (PORCINE) 1000 UNIT/ML IJ SOLN
INTRAMUSCULAR | Status: AC
Start: 2024-07-05 — End: 2024-07-05
  Filled 2024-07-05: qty 10

## 2024-07-05 MED ORDER — MIDAZOLAM HCL 2 MG/2ML IJ SOLN
INTRAMUSCULAR | Status: DC | PRN
  Administered 2024-07-05: 1 mg via INTRAVENOUS

## 2024-07-05 MED ORDER — FUROSEMIDE 10 MG/ML IJ SOLN
80.0000 mg | Freq: Two times a day (BID) | INTRAMUSCULAR | Status: DC
Start: 2024-07-05 — End: 2024-07-06
  Administered 2024-07-05 – 2024-07-06 (×2): 80 mg via INTRAVENOUS
  Filled 2024-07-05 (×2): qty 8

## 2024-07-05 MED ORDER — POTASSIUM CHLORIDE CRYS ER 20 MEQ PO TBCR
40.0000 meq | EXTENDED_RELEASE_TABLET | Freq: Once | ORAL | Status: AC
  Administered 2024-07-05: 40 meq via ORAL
  Filled 2024-07-05: qty 2

## 2024-07-05 MED ORDER — MIDAZOLAM HCL 2 MG/2ML IJ SOLN
INTRAMUSCULAR | Status: AC
  Filled 2024-07-05: qty 2

## 2024-07-05 MED ORDER — SACUBITRIL-VALSARTAN 49-51 MG PO TABS
1.0000 | ORAL_TABLET | Freq: Two times a day (BID) | ORAL | Status: DC
  Administered 2024-07-05 – 2024-07-06 (×3): 1 via ORAL
  Filled 2024-07-05 (×4): qty 1

## 2024-07-05 MED ORDER — FENTANYL CITRATE (PF) 100 MCG/2ML IJ SOLN
INTRAMUSCULAR | Status: DC | PRN
  Administered 2024-07-05: 25 ug via INTRAVENOUS

## 2024-07-05 MED ORDER — LIDOCAINE HCL (PF) 1 % IJ SOLN
INTRAMUSCULAR | Status: AC
  Filled 2024-07-05: qty 30

## 2024-07-05 MED ORDER — VERAPAMIL HCL 2.5 MG/ML IV SOLN
INTRAVENOUS | Status: DC | PRN
  Administered 2024-07-05: 10 mL via INTRA_ARTERIAL

## 2024-07-05 MED ORDER — LIDOCAINE HCL (PF) 1 % IJ SOLN
INTRAMUSCULAR | Status: DC | PRN
  Administered 2024-07-05: 10 mL via INTRADERMAL
  Administered 2024-07-05: 5 mL via INTRADERMAL

## 2024-07-05 MED ORDER — HEPARIN SODIUM (PORCINE) 1000 UNIT/ML IJ SOLN
INTRAMUSCULAR | Status: DC | PRN
  Administered 2024-07-05: 5000 [IU] via INTRAVENOUS

## 2024-07-05 MED ORDER — HEPARIN (PORCINE) IN NACL 1000-0.9 UT/500ML-% IV SOLN
INTRAVENOUS | Status: DC | PRN
  Administered 2024-07-05 (×2): 500 mL

## 2024-07-05 MED ORDER — VERAPAMIL HCL 2.5 MG/ML IV SOLN
INTRAVENOUS | Status: AC
Start: 2024-07-05 — End: 2024-07-05
  Filled 2024-07-05: qty 2

## 2024-07-05 MED ORDER — SPIRONOLACTONE 25 MG PO TABS
25.0000 mg | ORAL_TABLET | Freq: Every day | ORAL | Status: DC
  Administered 2024-07-05 – 2024-07-07 (×3): 25 mg via ORAL
  Filled 2024-07-05 (×3): qty 1

## 2024-07-05 MED ORDER — FENTANYL CITRATE (PF) 100 MCG/2ML IJ SOLN
INTRAMUSCULAR | Status: AC
  Filled 2024-07-05: qty 2

## 2024-07-05 SURGICAL SUPPLY — 9 items
CATH 5FR JL3.5 JR4 ANG PIG MP (CATHETERS) IMPLANT
CATH BALLN WEDGE 5F 110CM (CATHETERS) IMPLANT
DEVICE RAD COMP TR BAND LRG (VASCULAR PRODUCTS) IMPLANT
GLIDESHEATH SLEND SS 6F .021 (SHEATH) IMPLANT
GUIDEWIRE INQWIRE 1.5J.035X260 (WIRE) IMPLANT
KIT HEART LEFT (KITS) IMPLANT
PACK CARDIAC CATHETERIZATION (CUSTOM PROCEDURE TRAY) ×1 IMPLANT
SHEATH GLIDE SLENDER 4/5FR (SHEATH) IMPLANT
TUBING CIL FLEX 10 FLL-RA (TUBING) IMPLANT

## 2024-07-05 NOTE — Plan of Care (Signed)
  Problem: Education: Goal: Ability to describe self-care measures that may prevent or decrease complications (Diabetes Survival Skills Education) will improve Outcome: Progressing   Problem: Education: Goal: Knowledge of General Education information will improve Description: Including pain rating scale, medication(s)/side effects and non-pharmacologic comfort measures Outcome: Progressing   Problem: Clinical Measurements: Goal: Cardiovascular complication will be avoided Outcome: Progressing   Problem: Pain Managment: Goal: General experience of comfort will improve and/or be controlled Outcome: Progressing   Problem: Cardiac: Goal: Ability to achieve and maintain adequate cardiopulmonary perfusion will improve Outcome: Progressing

## 2024-07-05 NOTE — Interval H&P Note (Signed)
 History and Physical Interval Note:  07/05/2024 12:40 PM  Jonathan Fowler  has presented today for surgery, with the diagnosis of HF.  The various methods of treatment have been discussed with the patient and family. After consideration of risks, benefits and other options for treatment, the patient has consented to  Procedure(s): RIGHT/LEFT HEART CATH AND CORONARY ANGIOGRAPHY (N/A) as a surgical intervention.  The patient's history has been reviewed, patient examined, no change in status, stable for surgery.  I have reviewed the patient's chart and labs.  Questions were answered to the patient's satisfaction.     Elyna Pangilinan [employer]

## 2024-07-05 NOTE — Progress Notes (Addendum)
 Progress Note   Patient: Jonathan Fowler FMW:[id number] DOB: [birth year] DOA: 06/29/2024     6 DOS: the patient was seen and examined on 07/05/2024   Brief hospital course: Mr. Olmeda was admitted to the hospital with the working diagnosis of heart failure decompensation.   68 yo male with past medical history of hypertension, hyperlipidemia, and T2DM who presented with dyspnea. Reported one week of worsening dyspnea, orthopnea, PND and lower extremity edema. On the day of admission he was evaluated by his primary care provider, found him volume overloaded and referred him to the hospital. On his initial physical examination his blood pressure was 165/114, HR 86, RR 18 and 02 saturation 97% Lungs with bilateral rales with no wheezing or rhonchi, heart with S1 and S2 present and regular with no gallops or murmurs, abdomen with no distention, positive lower extremity edema pitting +++.   Na 138, K 3,8 Cl 105 bicarbonate 21, glucose 131, bun 29 cr 1.36  AST 29 ALT 27  BNP 1,746 High sensitive troponin 48 and 44  Wbc 8,3 hgb 12.1 plt 230   Chest radiograph with mild cardiomegaly, with bilateral hilar vascular congestion, with no effusions or infiltrates.   EKG 91 bpm, left axis deviation, left anterior fascicular block, right bundle branch block, qtc 546, sinus rhythm, with J point elevation II, III, aVF, V4 to V6, positive LVH, with no significant ST segment or T wave changes.   Patient placed on IV furosemide  for diuresis.  Echocardiogram with reduced LV systolic function with biventricular failure.  09/19 cardiac catheterization with elevated filling pressures.  09/20 improving volume status.  09/21 responding well to diuresis, pending left heart catheterization.   Assessment and Plan: * Acute on chronic systolic CHF (congestive heart failure) (HCC) Echocardiogram with reduced LV systolic function with EF <20%, global hypokinesis, severe dilatated LV cavity, grade II diastolic  dysfunction (pseudo normalization EA), RV systolic function with moderate reduction, LA and RA with severe dilatation, RVSP 46,6 mmHg, mild TR and mild MR.   09/19 cardiac catheterization  RA 9  RV 64/10 PA 58/29 mean 40  PCWP mean 27 Cardiac output 5,76 L/min and index 2.5 L/min/m2(Fick) Cardiac output 4.46 L/min and index 2 L/min/m2 (thermodilution)  PVR 2.25 to 2.9 Wood Units  Elevated filling pressures, with pre and post capillary pulmonary hypertension.   09/22 cardiac catheterization left and right.,  Non obstructive coronary artery disease.  RA 11 RV 61/13 PA 67/31 mean 42 PCWP mean 21  Cardiac output 6.64 index 3,0 (Fick) PVR 3.1   Continue elevated filling pressures.   Cardiac MRI  Reduced LV systolic function 16%, global hypokinesis, RV with severe dilatation with EF 29%, non ischemic cardiomyopathy.   Urine output 2,450 ml Systolic blood pressure 130 to 150  mmHg.   Continue diuresis with IV furosemide  80 mg IV bid.   Continue spironolactone , SGLT 2 inh. Afterload reduction with sacubitril  valsartan  and Bidil .   Essential hypertension Continue blood pressure control with irbesartan , bidil , and diuresis with furosemide .  Spironolactone .   Chronic kidney disease, stage 3a (HCC) AKI Hypokalemia. Hypomagnesemia   Renal function with serum cr at 1.37 with K at 3,2 and serum bicarbonate at 26  Na 140 and Mg 2.0   Continue Kcl 40 meq x2  Continue diuresis and follow up renal function in am.   Type 2 diabetes mellitus with hyperlipidemia (HCC) Fasting glucose today is 93 mg/dl  Off insulin  therapy Hgb A1c is 5.5   Continue statin therapy  Iron  deficiency anemia Serum iron  31, transferrin saturation 10, ferritin 26 and TIBC 301.  Hgb 11.2 09/19 IV iron  sucrose 500 mg   Obesity, class 1 Calculated BMI is 33.6      Subjective: Patient feeling better, no chest pain, no dyspnea, no edema, PND or orthopnea   Physical Exam: Vitals:   07/04/24 2354  07/05/24 0500 07/05/24 0523 07/05/24 0727  BP: 116/74  (!) 163/95 (!) 136/94  Pulse: 75  90 80  Resp: 19  18 17   Temp: 97.9 F (36.6 C)  97.8 F (36.6 C) (!) 97.5 F (36.4 C)  TempSrc: Oral  Oral Oral  SpO2: 98%  99% 95%  Weight:  100.7 kg    Height:       Neurology awake and alert ENT with mild pallor with no icterus Cardiovascular with S1 and S2 present and regular with no gallops, rubs or murmurs No JVD  Respiratory with no rales or wheezing, no rhonchi  Abdomen with no distention  Trace pedal edema   Data Reviewed:    Family Communication: no family at the bedside   Disposition: Status is: Inpatient Remains inpatient appropriate because: recovering heart failure   Planned Discharge Destination: Home     Author: Elidia Toribio Furnace, MD 07/05/2024 10:08 AM  For on call review [email or web address].

## 2024-07-05 NOTE — Care Management Important Message (Signed)
 Important Message  Patient Details  Name: Jonathan Fowler MRN: [id number] Date of Birth: 11-Aug-1956   Important Message Given:  Yes - Medicare IM     Claretta Deed 07/05/2024, 1:42 PM

## 2024-07-05 NOTE — Progress Notes (Addendum)
 Advanced Heart Failure Rounding Note  Cardiologist: None  Chief Complaint: Acute Heart Failure  Subjective:   9/17- Diuresed with IV lasix .  9/18 Lasix  held due to creatinine bump. CMRI - NICM Biventricular HF.   - Feels well today; no acute events overnight. Has been ambulating. Wife at bedside.  - 2.4L UOP yesterday.   BP 130s-160s.   Objective:   Weight Range: 100.7 kg Body mass index is 30.96 kg/m.   Vital Signs:   Temp:  [97.5 F (36.4 C)-97.9 F (36.6 C)] 97.5 F (36.4 C) (09/22 0727) Pulse Rate:  [75-96] 80 (09/22 0727) Resp:  [17-20] 17 (09/22 0727) BP: (116-163)/(74-95) 136/94 (09/22 0727) SpO2:  [95 %-99 %] 95 % (09/22 0727) FiO2 (%):  [21 %] 21 % (09/21 2151) Weight:  [100.7 kg] 100.7 kg (09/22 0500) Last BM Date : 07/03/24  Weight change: Filed Weights   07/03/24 0500 07/04/24 0540 07/05/24 0500  Weight: 102.4 kg 100.8 kg 100.7 kg    Intake/Output:   Intake/Output Summary (Last 24 hours) at 07/05/2024 1159 Last data filed at 07/04/2024 2345 Gross per 24 hour  Intake 900 ml  Output 2450 ml  Net -1550 ml    Physical Exam   Vitals:   07/05/24 0523 07/05/24 0727  BP: (!) 163/95 (!) 136/94  Pulse: 90 80  Resp: 18 17  Temp: 97.8 F (36.6 C) (!) 97.5 F (36.4 C)  SpO2: 99% 95%  General:  well appearing.  No respiratory difficulty Neck: JVD ~9 cm.  Cor: Regular rate & rhythm. No murmurs. Lungs: clear Extremities: no edema  Neuro: alert & oriented x 3. Affect pleasant.   Telemetry  SR 80s-90s LBBB with PVCs.   EKG   N/A  Labs    CBC No results for input(s): WBC, NEUTROABS, HGB, HCT, MCV, PLT in the last 72 hours.  Basic Metabolic Panel Recent Labs    90/78/74 0142 07/05/24 0225  NA 141 140  K 3.7 3.2*  CL 102 102  CO2 30 26  GLUCOSE 100* 93  BUN 21 27*  CREATININE 1.49* 1.37*  CALCIUM  8.6* 8.7*  MG 2.0 2.0    BNP (last 3 results) Recent Labs    06/29/24 1412  BNP 1,746.1*    No results for input(s):  TSH, T4TOTAL, T3FREE, THYROIDAB in the last 72 hours.  Invalid input(s): FREET3   Other results:   Imaging    No results found.    Medications:     Scheduled Medications:  atorvastatin   10 mg Oral Daily   empagliflozin   10 mg Oral Daily   enoxaparin  (LOVENOX ) injection  40 mg Subcutaneous Q24H   free water   200 mL Oral Once   irbesartan   300 mg Oral Daily   isosorbide -hydrALAZINE   1.5 tablet Oral TID   potassium chloride   40 mEq Oral Once   spironolactone   12.5 mg Oral Daily    Infusions:    PRN Medications: acetaminophen  **OR** acetaminophen , ALPRAZolam   Patient Profile  [name] is a very pleasant 68 year old male with history of hypertension, hyperlipidemia, type 2 diabetes, osteoarthritis currently admitted with acute systolic heart failure.   Assessment/Plan  Acute systolic heart failure - Echo showed EF <20%, LV with GHK, LV severely dilated, GIIDD, RV mod reduced, mod elevated PASP, LA/RA severely dilated, mild MR. TSH ok. HIV NR.  - NYHA IV on admission - CMR on 07/01/24 with severely dilated right ventricle with severely reduced function.  LVEF 15%. - RHC 07/02/2024 with severely elevated  biventricular filling pressures and reduced cardiac index.  Due to elevated serum creatinine left heart cath deferred. - Holding diuretics. SCr continues to improve. Will follow up post cath.  - Blood pressure mildly elevated this morning but bidil  held 2/2 pendng RHC. Follow up this afternoon. Continue irbesartan  - Plan for repeat right and left heart catheterization on Monday, 07/05/2024.  Although his cardiac MRI is nonischemic he has significant conduction system disease with a QRS duration of 194 ms, need to rule out multivessel CAD.  Will repeat EKG prior to discharge.  There is some data for biventricular pacing with excessively delayed ventricular conduction.   2. HTN - Elevated. Continue bidil  1.5 tab TID (may need to increase to 2 tab TID, elevated this  morning but bidil  held with pending RHC) + irbesartan  300 mg daily. - Increase spiro 12.5>25 mg daily - Avoid hypotension with AKI.    3. HLD - LDL 6/25 46 - Continue statin  4.  AKI -Followed by Dr Tobie.  -Continuing to improve; sCr 1.37 today   Length of Stay: 6  Beckey LITTIE Coe, NP  07/05/2024, 11:59 AM  Advanced Heart Failure Team Pager [phone] (M-F; 7a - 5p)  Please contact [facility] for night-coverage after hours (5p -7a ) and weekends on [email or web address]  Patient seen with NP, I formulated the plan and agree with the above note.   LHC/RHC today: Coronary Findings  Diagnostic Dominance: Right Left Anterior Descending  Prox LAD to Mid LAD lesion is 30% stenosed.  Dist LAD lesion is 40% stenosed.    Right Coronary Artery  Prox RCA to Mid RCA lesion is 30% stenosed.    Intervention   No interventions have been documented.   Right Heart  Right Heart Pressures RHC Procedural Findings: Hemodynamics (mmHg) RA mean 11 RV 61/13 PA 67/31, mean 42 PCWP mean 21 LV 154/30 AO 142/90  Oxygen saturations: PA 67% AO 95%  Cardiac Output (Fick) 6.64  Cardiac Index (Fick) 3.01 PVR 3.1 WU  PAPi 3.2   General: NAD Neck: JVP 10-12 cm, no thyromegaly or thyroid  nodule.  Lungs: Clear to auscultation bilaterally with normal respiratory effort. CV: Nondisplaced PMI.  Heart regular S1/S2, no S3/S4, no murmur.  No peripheral edema.   Abdomen: Soft, nontender, no hepatosplenomegaly, no distention.  Skin: Intact without lesions or rashes.  Neurologic: Alert and oriented x 3.  Psych: Normal affect. Extremities: No clubbing or cyanosis.  HEENT: Normal.   Nonischemic cardiomyopathy.  Still with volume overload by exam and by RHC.  Preserved cardiac output. Creatinine lower 1.37.  - Continue spironolactone , Jardiance .  - Lasix  80 mg IV bid for at least 2 more doses.  - Transition from irbesartan  to Entresto  49/51 bid (patient had cough, not angioedema with lisinopril). He  has not had irbesartan  today.  - With hypertension, continue Bidil  for now, hopefully we can titrate off.  - Very wide QRS 192 msec but RBBB morphology.  Can discuss with EP benefit of CRT in this situation if EF remains low.   Nonobstructive CAD, continue statin.   Ezra Shuck 07/05/2024 1:36 PM

## 2024-07-05 NOTE — Plan of Care (Signed)
  Problem: Fluid Volume: Goal: Ability to maintain a balanced intake and output will improve Outcome: Progressing   Problem: Metabolic: Goal: Ability to maintain appropriate glucose levels will improve Outcome: Progressing   Problem: Nutritional: Goal: Maintenance of adequate nutrition will improve Outcome: Progressing   Problem: Education: Goal: Knowledge of General Education information will improve Description: Including pain rating scale, medication(s)/side effects and non-pharmacologic comfort measures Outcome: Progressing   Problem: Clinical Measurements: Goal: Ability to maintain clinical measurements within normal limits will improve Outcome: Progressing Goal: Will remain free from infection Outcome: Progressing Goal: Diagnostic test results will improve Outcome: Progressing Goal: Respiratory complications will improve Outcome: Progressing Goal: Cardiovascular complication will be avoided Outcome: Progressing   Problem: Activity: Goal: Risk for activity intolerance will decrease Outcome: Progressing   Problem: Nutrition: Goal: Adequate nutrition will be maintained Outcome: Progressing

## 2024-07-06 ENCOUNTER — Other Ambulatory Visit (HOSPITAL_COMMUNITY): Payer: Self-pay

## 2024-07-06 ENCOUNTER — Encounter (HOSPITAL_COMMUNITY): Payer: Self-pay | Admitting: Cardiology

## 2024-07-06 ENCOUNTER — Telehealth (HOSPITAL_COMMUNITY): Payer: Self-pay | Admitting: Pharmacy Technician

## 2024-07-06 DIAGNOSIS — I5023 Acute on chronic systolic (congestive) heart failure: Secondary | ICD-10-CM | POA: Diagnosis not present

## 2024-07-06 LAB — BASIC METABOLIC PANEL WITH GFR
Anion gap: 12 (ref 5–15)
BUN: 22 mg/dL (ref 8–23)
CO2: 26 mmol/L (ref 22–32)
Calcium: 8.9 mg/dL (ref 8.9–10.3)
Chloride: 102 mmol/L (ref 98–111)
Creatinine, Ser: 1.26 mg/dL — ABNORMAL HIGH (ref 0.61–1.24)
GFR, Estimated: >60 mL/min
Glucose, Bld: 124 mg/dL — ABNORMAL HIGH (ref 70–99)
Potassium: 3.4 mmol/L — ABNORMAL LOW (ref 3.5–5.1)
Sodium: 140 mmol/L (ref 135–145)

## 2024-07-06 LAB — MAGNESIUM: Magnesium: 1.9 mg/dL (ref 1.7–2.4)

## 2024-07-06 MED ORDER — POTASSIUM CHLORIDE CRYS ER 20 MEQ PO TBCR
40.0000 meq | EXTENDED_RELEASE_TABLET | Freq: Once | ORAL | Status: AC
  Administered 2024-07-06: 40 meq via ORAL
  Filled 2024-07-06: qty 2

## 2024-07-06 MED ORDER — FUROSEMIDE 10 MG/ML IJ SOLN
80.0000 mg | Freq: Once | INTRAMUSCULAR | Status: AC
  Administered 2024-07-06: 80 mg via INTRAVENOUS
  Filled 2024-07-06: qty 8

## 2024-07-06 MED ORDER — CARVEDILOL 3.125 MG PO TABS
3.1250 mg | ORAL_TABLET | Freq: Two times a day (BID) | ORAL | Status: DC
  Administered 2024-07-06 – 2024-07-07 (×2): 3.125 mg via ORAL
  Filled 2024-07-06 (×2): qty 1

## 2024-07-06 MED ORDER — SACUBITRIL-VALSARTAN 97-103 MG PO TABS
1.0000 | ORAL_TABLET | Freq: Two times a day (BID) | ORAL | Status: DC
  Administered 2024-07-06 – 2024-07-07 (×2): 1 via ORAL
  Filled 2024-07-06 (×2): qty 1

## 2024-07-06 MED ORDER — TORSEMIDE 20 MG PO TABS
20.0000 mg | ORAL_TABLET | Freq: Every day | ORAL | Status: DC
  Administered 2024-07-07: 20 mg via ORAL
  Filled 2024-07-06: qty 1

## 2024-07-06 MED ORDER — ASPIRIN 81 MG PO CHEW
81.0000 mg | CHEWABLE_TABLET | Freq: Every day | ORAL | Status: DC
  Administered 2024-07-06 – 2024-07-07 (×2): 81 mg via ORAL
  Filled 2024-07-06 (×2): qty 1

## 2024-07-06 MED ORDER — MAGNESIUM SULFATE 2 GM/50ML IV SOLN
2.0000 g | Freq: Once | INTRAVENOUS | Status: AC
Start: 2024-07-06 — End: 2024-07-06
  Administered 2024-07-06: 2 g via INTRAVENOUS
  Filled 2024-07-06: qty 50

## 2024-07-06 MED ORDER — HYDROCORTISONE 1 % EX CREA
1.0000 | TOPICAL_CREAM | Freq: Three times a day (TID) | CUTANEOUS | Status: DC | PRN
  Filled 2024-07-06 (×2): qty 28

## 2024-07-06 MED ORDER — POTASSIUM CHLORIDE CRYS ER 20 MEQ PO TBCR
40.0000 meq | EXTENDED_RELEASE_TABLET | ORAL | Status: AC
  Administered 2024-07-06 (×2): 40 meq via ORAL
  Filled 2024-07-06 (×2): qty 2

## 2024-07-06 NOTE — Plan of Care (Signed)
  Problem: Skin Integrity: Goal: Risk for impaired skin integrity will decrease Outcome: Progressing   Problem: Activity: Goal: Risk for activity intolerance will decrease Outcome: Progressing   Problem: Nutrition: Goal: Adequate nutrition will be maintained Outcome: Progressing   Problem: Coping: Goal: Level of anxiety will decrease Outcome: Progressing   Problem: Elimination: Goal: Will not experience complications related to bowel motility Outcome: Progressing Goal: Will not experience complications related to urinary retention Outcome: Progressing   Problem: Pain Managment: Goal: General experience of comfort will improve and/or be controlled Outcome: Progressing

## 2024-07-06 NOTE — Progress Notes (Incomplete)
 PROGRESS NOTE    Jonathan Fowler  FMW:[id number] DOB: [birth year] DOA: 06/29/2024 PCP: Regino Slater, MD   68/M w hypertension, hyperlipidemia, and T2DM who presented with dyspnea, orthopnea, PND and lower extremity edema. Na 138, K 3,8 Cl 105 bicarbonate 21, glucose 131, bun 29 cr 1.36  AST 29 ALT 27 BNP 1,746, troponin 48 and 44  CXR w mild cardiomegaly, with bilateral hilar vascular congestion, with no effusions or infiltrates.  -placed on IV furosemide  for diuresis.  Echocardiogram with reduced LV systolic function with biventricular failure.  -9/19 cardiac catheterization with elevated filling pressures.  -9/21 responding well to diuresis, pending left heart catheterization.  -9/22 resume IV diuresis, due to elevated filling pressures.   Subjective:   Assessment and Plan:  Acute on chronic systolic CHF -Echo with EF <20%, global hypokinesis, grade II DD, RV moderate reduction,  -RHC-9/19-RV 64/10, PA 58/29 mean 40 , PCWP mean 27 Cardiac output 5,76 L/min and index 2.5 L/min/m2(Fick) -repeat R/LHC: Non obstructive coronary artery disease.  RV 61/13, PA 67/31 mean 42, PCWP mean 21 , CI-3 -Cardiac MRI -Reduced LV systolic function 16%, global hypokinesis, RV with severe dilatation with EF 29%, non ischemic cardiomyopathy.  -diuresis with IV furosemide  80 mg IV bid for today and transition to torsemide  in am.  Continue spironolactone , SGLT 2 inh. Afterload reduction with sacubitril  valsartan  Starting B blocker with carvedilol  3.125 mg po bid.  Discontinue bidil .   Essential hypertension Continue blood pressure control with irbesartan , spironolactone  and added carvedilol .  Discontinue bidil .   Chronic kidney disease, stage 3a (HCC) AKI Hypokalemia. Hypomagnesemia Continue diuresis and follow up renal function in am.  Today IV furosemide  and tomorrow po torsemide    Type 2 diabetes mellitus  Fasting glucose today is 93 mg/dl  Off insulin  therapy Hgb A1c is 5.5   Iron   deficiency anemia Serum iron  31, transferrin saturation 10, ferritin 26 and TIBC 301.  Hgb 11.2 09/19 IV iron  sucrose 500 mg   Obesity, class 1 Calculated BMI is 33.6   DVT prophylaxis: lovenox  Code Status: Full Code Family Communication: Disposition Plan:   Consultants:    Procedures:   Antimicrobials:    Objective: Vitals:   07/06/24 0339 07/06/24 0709 07/06/24 1056 07/06/24 1455  BP: 122/89 (!) 127/95 129/79 (!) 142/91  Pulse: 75 89 91 85  Resp: 18 18 20 18   Temp: 97.7 F (36.5 C) (!) 97.4 F (36.3 C) 98 F (36.7 C) 98.2 F (36.8 C)  TempSrc: Oral Oral Oral Oral  SpO2: 93% 93% 92% 95%  Weight:      Height:        Intake/Output Summary (Last 24 hours) at 07/06/2024 1658 Last data filed at 07/06/2024 1200 Gross per 24 hour  Intake 600 ml  Output 3100 ml  Net -2500 ml   Filed Weights   07/03/24 0500 07/04/24 0540 07/05/24 0500  Weight: 102.4 kg 100.8 kg 100.7 kg    Examination:      Data Reviewed:   CBC: Recent Labs  Lab 06/30/24 0106 07/02/24 0919 07/05/24 1256  WBC 6.5  --   --   HGB 11.6* 11.2*  11.2* 11.9*  12.6*  HCT 36.9* 33.0*  33.0* 35.0*  37.0*  MCV 94.1  --   --   PLT 180  --   --    Basic Metabolic Panel: Recent Labs  Lab 07/02/24 0255 07/02/24 0919 07/03/24 0222 07/04/24 0142 07/05/24 0225 07/05/24 1256 07/06/24 0336  NA 139   < > 142 141  140 144  142 140  K 3.3*   < > 3.0* 3.7 3.2* 3.0*  3.2* 3.4*  CL 104  --  101 102 102  --  102  CO2 26  --  29 30 26   --  26  GLUCOSE 116*  --  98 100* 93  --  124*  BUN 27*  --  24* 21 27*  --  22  CREATININE 1.72*  --  1.56* 1.49* 1.37*  --  1.26*  CALCIUM  8.6*  --  8.4* 8.6* 8.7*  --  8.9  MG 2.0  --  1.8 2.0 2.0  --  1.9   < > = values in this interval not displayed.   GFR: Estimated Creatinine Clearance: 67.9 mL/min (A) (by C-G formula based on SCr of 1.26 mg/dL (H)). Liver Function Tests: No results for input(s): AST, ALT, ALKPHOS, BILITOT, PROT, ALBUMIN  in the last 168 hours. No results for input(s): LIPASE, AMYLASE in the last 168 hours. No results for input(s): AMMONIA in the last 168 hours. Coagulation Profile: No results for input(s): INR, PROTIME in the last 168 hours. Cardiac Enzymes: No results for input(s): CKTOTAL, CKMB, CKMBINDEX, TROPONINI in the last 168 hours. BNP (last 3 results) No results for input(s): PROBNP in the last 8760 hours. HbA1C: No results for input(s): HGBA1C in the last 72 hours. CBG: Recent Labs  Lab 07/02/24 0604 07/02/24 1109 07/02/24 1622 07/02/24 2116 07/03/24 0630  GLUCAP 92 147* 105* 120* 106*   Lipid Profile: No results for input(s): CHOL, HDL, LDLCALC, TRIG, CHOLHDL, LDLDIRECT in the last 72 hours. Thyroid  Function Tests: No results for input(s): TSH, T4TOTAL, FREET4, T3FREE, THYROIDAB in the last 72 hours. Anemia Panel: No results for input(s): VITAMINB12, FOLATE, FERRITIN, TIBC, IRON , RETICCTPCT in the last 72 hours. Urine analysis:    Component Value Date/Time   COLORURINE YELLOW 06/13/2022 2246   APPEARANCEUR CLEAR 06/13/2022 2246   LABSPEC 1.012 06/13/2022 2246   PHURINE 5.0 06/13/2022 2246   GLUCOSEU NEGATIVE 06/13/2022 2246   HGBUR SMALL (A) 06/13/2022 2246   BILIRUBINUR NEGATIVE 06/13/2022 2246   KETONESUR NEGATIVE 06/13/2022 2246   PROTEINUR 100 (A) 06/13/2022 2246   UROBILINOGEN 0.2 03/28/2013 1507   NITRITE NEGATIVE 06/13/2022 2246   LEUKOCYTESUR NEGATIVE 06/13/2022 2246   Sepsis Labs: @LABRCNTIP (procalcitonin:4,lacticidven:4)  ) Recent Results (from the past 240 hours)  Surgical pcr screen     Status: None   Collection Time: 06/30/24  2:29 PM   Specimen: Nasal Mucosa; Nasal Swab  Result Value Ref Range Status   MRSA, PCR NEGATIVE NEGATIVE Final   Staphylococcus aureus NEGATIVE NEGATIVE Final    Comment: (NOTE) The Xpert SA Assay (FDA approved for NASAL specimens in patients 54 years of age and older), is  one component of a comprehensive surveillance program. It is not intended to diagnose infection nor to guide or monitor treatment. Performed at [facility]: [facility], [address], [place], [place] [address]      Radiology Studies: CARDIAC CATHETERIZATION Result Date: 07/05/2024   Prox RCA to Mid RCA lesion is 30% stenosed.   Dist LAD lesion is 40% stenosed.   Prox LAD to Mid LAD lesion is 30% stenosed. 1. Filling pressures remain elevated. 2. Preserved cardiac output. 3. Moderate mixed pulmonary venous/pulmonary arterial hypertension. 4. Nonobstructive CAD.     Scheduled Meds:  aspirin   81 mg Oral Daily   atorvastatin   10 mg Oral Daily   carvedilol   3.125 mg Oral BID WC   empagliflozin   10 mg Oral Daily   enoxaparin  (LOVENOX ) injection  40 mg Subcutaneous Q24H   furosemide   80 mg Intravenous Once   potassium chloride   40 mEq Oral Once   sacubitril -valsartan   1 tablet Oral BID   spironolactone   25 mg Oral Daily   [START ON 07/07/2024] torsemide   20 mg Oral Daily   Continuous Infusions:   LOS: 7 days    Time spent:    Sigurd Pac, MD [facility]   07/06/2024, 4:58 PM

## 2024-07-06 NOTE — Telephone Encounter (Signed)
 Patient Product/process development scientist completed.    The patient is insured through [facility]. Patient has [employer], may use a copay card, and/or apply for patient assistance if available.    Ran test claim for sacubitril -valsartan  (Entresto ) 24-26 mg and the current 30 day co-pay is $0.00.  Ran test claim for Jardiance  10 mg and the current 30 day co-pay is $0.00.  This test claim was processed through [facility]- copay amounts may vary at other pharmacies due to pharmacy/plan contracts, or as the patient moves through the different stages of their insurance plan.     Reyes Sharps, CPHT Pharmacy Technician III Certified Patient Advocate [facility] Patient Advocate Team Direct Number: [phone]  Fax: [phone]

## 2024-07-06 NOTE — Progress Notes (Signed)
 Progress Note   Patient: Jonathan Fowler FMW:[id number] DOB: 05-Jun-1956 DOA: 06/29/2024     7 DOS: the patient was seen and examined on 07/06/2024   Brief hospital course: Mr. Zahradnik was admitted to the hospital with the working diagnosis of heart failure decompensation.   68 yo male with past medical history of hypertension, hyperlipidemia, and T2DM who presented with dyspnea. Reported one week of worsening dyspnea, orthopnea, PND and lower extremity edema. On the day of admission he was evaluated by his primary care provider, found him volume overloaded and referred him to the hospital. On his initial physical examination his blood pressure was 165/114, HR 86, RR 18 and 02 saturation 97% Lungs with bilateral rales with no wheezing or rhonchi, heart with S1 and S2 present and regular with no gallops or murmurs, abdomen with no distention, positive lower extremity edema pitting +++.   Na 138, K 3,8 Cl 105 bicarbonate 21, glucose 131, bun 29 cr 1.36  AST 29 ALT 27  BNP 1,746 High sensitive troponin 48 and 44  Wbc 8,3 hgb 12.1 plt 230   Chest radiograph with mild cardiomegaly, with bilateral hilar vascular congestion, with no effusions or infiltrates.   EKG 91 bpm, left axis deviation, left anterior fascicular block, right bundle branch block, qtc 546, sinus rhythm, with J point elevation II, III, aVF, V4 to V6, positive LVH, with no significant ST segment or T wave changes.   Patient placed on IV furosemide  for diuresis.  Echocardiogram with reduced LV systolic function with biventricular failure.  09/19 cardiac catheterization with elevated filling pressures.  09/20 improving volume status.  09/21 responding well to diuresis, pending left heart catheterization.  09/22 resume IV diuresis, due to elevated filling pressures.  09/23 possible transition to po loop diuretic tomorrow.   Assessment and Plan: * Acute on chronic systolic CHF (congestive heart failure) (HCC) Echocardiogram  with reduced LV systolic function with EF <20%, global hypokinesis, severe dilatated LV cavity, grade II diastolic dysfunction (pseudo normalization EA), RV systolic function with moderate reduction, LA and RA with severe dilatation, RVSP 46,6 mmHg, mild TR and mild MR.   09/19 cardiac catheterization  RA 9  RV 64/10 PA 58/29 mean 40  PCWP mean 27 Cardiac output 5,76 L/min and index 2.5 L/min/m2(Fick) Cardiac output 4.46 L/min and index 2 L/min/m2 (thermodilution)  PVR 2.25 to 2.9 Wood Units  Elevated filling pressures, with pre and post capillary pulmonary hypertension.   09/22 cardiac catheterization left and right.,  Non obstructive coronary artery disease.  RA 11 RV 61/13 PA 67/31 mean 42 PCWP mean 21  Cardiac output 6.64 index 3,0 (Fick) PVR 3.1   Continue elevated filling pressures.   Cardiac MRI  Reduced LV systolic function 16%, global hypokinesis, RV with severe dilatation with EF 29%, non ischemic cardiomyopathy.   Urine output 3,800 ml Systolic blood pressure 120 to 130  mmHg.   Continue diuresis with IV furosemide  80 mg IV bid for today and transition to torsemide  in am.  Continue spironolactone , SGLT 2 inh. Afterload reduction with sacubitril  valsartan  Starting B blocker with carvedilol  3.125 mg po bid.  Discontinue bidil .   Essential hypertension Continue blood pressure control with irbesartan , spironolactone  and added carvedilol .  Discontinue bidil .   Chronic kidney disease, stage 3a (HCC) AKI Hypokalemia. Hypomagnesemia   Continue to improve renal function with serum cr at 1.26 with K at 3,4 and serum bicarbonate at 26  Na 140 and Mg 1.9   Continue Kcl 40 meq x2 and  2 g Mag sulfate.  Continue diuresis and follow up renal function in am.  Today IV furosemide  and tomorrow po torsemide    Type 2 diabetes mellitus with hyperlipidemia (HCC) Fasting glucose today is 93 mg/dl  Off insulin  therapy Hgb A1c is 5.5   Continue statin therapy   Iron   deficiency anemia Serum iron  31, transferrin saturation 10, ferritin 26 and TIBC 301.  Hgb 11.2 09/19 IV iron  sucrose 500 mg   Obesity, class 1 Calculated BMI is 33.6      Subjective: Patient very tired and deconditioned, no chest pain, dyspnea and edema have improved, no PND or orthopnea.   Physical Exam: Vitals:   07/05/24 2159 07/06/24 0339 07/06/24 0709 07/06/24 1056  BP: (!) 137/95 122/89 (!) 127/95 129/79  Pulse:  75 89 91  Resp: 18 18 18    Temp: 98 F (36.7 C) 97.7 F (36.5 C) (!) 97.4 F (36.3 C) 98 F (36.7 C)  TempSrc: Oral Oral Oral Oral  SpO2:  93% 93% 92%  Weight:      Height:       Neurology awake and alert,  ENT with mild pallor with no icterus Cardiovascular with S1 and S2 present and regular with no gallops, rubs or murmurs No JVD Respiratory with no rales or wheezing, no rhonchi  Abdomen with no distention, soft and non tender Trace pedal edema.   Data Reviewed:    Family Communication: no family at the bedside   Disposition: Status is: Inpatient Remains inpatient appropriate because: recovering heart failure   Planned Discharge Destination: Home    Author: Elidia Toribio Furnace, MD 07/06/2024 1:34 PM  For on call review [email or web address].

## 2024-07-06 NOTE — Progress Notes (Addendum)
 Advanced Heart Failure Rounding Note  Cardiologist: None  Chief Complaint: Acute Heart Failure  Subjective:   9/17- Diuresed with IV lasix .  9/18 Lasix  held due to creatinine bump. CMRI - NICM Biventricular HF.  9/22: Cath LHC nonobs CAD. Elevated filling pressures. Diuresed with IV lasix    Denies SOB   Objective:   Weight Range: 100.7 kg Body mass index is 30.96 kg/m.   Vital Signs:   Temp:  [97.4 F (36.3 C)-98.4 F (36.9 C)] 98 F (36.7 C) (09/23 1056) Pulse Rate:  [0-94] 91 (09/23 1056) Resp:  [16-29] 18 (09/23 0709) BP: (122-169)/(79-111) 129/79 (09/23 1056) SpO2:  [92 %-97 %] 92 % (09/23 1056) FiO2 (%):  [21 %] 21 % (09/22 1934) Last BM Date : 07/04/24  Weight change: Filed Weights   07/03/24 0500 07/04/24 0540 07/05/24 0500  Weight: 102.4 kg 100.8 kg 100.7 kg    Intake/Output:   Intake/Output Summary (Last 24 hours) at 07/06/2024 1147 Last data filed at 07/06/2024 0812 Gross per 24 hour  Intake 360 ml  Output 2875 ml  Net -2515 ml    Physical Exam   Vitals:   07/06/24 0709 07/06/24 1056  BP: (!) 127/95 129/79  Pulse: 89 91  Resp: 18   Temp: (!) 97.4 F (36.3 C) 98 F (36.7 C)  SpO2: 93% 92%   General:   No resp difficulty. Standing in the room  Neck: no JVD.  Cor: Regular rate & rhythm.  Lungs: clear Abdomen: soft, nontender, nondistended.  Extremities: no  edema. LUE erythema. IV infiltrate  Neuro: alert & oriented x3  Telemetry  SR-ST 90-100s    EKG   N/A  Labs    CBC Recent Labs    07/05/24 1256  HGB 11.9*  12.6*  HCT 35.0*  37.0*    Basic Metabolic Panel Recent Labs    90/77/74 0225 07/05/24 1256 07/06/24 0336  NA 140 144  142 140  K 3.2* 3.0*  3.2* 3.4*  CL 102  --  102  CO2 26  --  26  GLUCOSE 93  --  124*  BUN 27*  --  22  CREATININE 1.37*  --  1.26*  CALCIUM  8.7*  --  8.9  MG 2.0  --  1.9    BNP (last 3 results) Recent Labs    06/29/24 1412  BNP 1,746.1*    No results for input(s): TSH,  T4TOTAL, T3FREE, THYROIDAB in the last 72 hours.  Invalid input(s): FREET3   Other results:   Imaging    CARDIAC CATHETERIZATION Result Date: 07/05/2024   Prox RCA to Mid RCA lesion is 30% stenosed.   Dist LAD lesion is 40% stenosed.   Prox LAD to Mid LAD lesion is 30% stenosed. 1. Filling pressures remain elevated. 2. Preserved cardiac output. 3. Moderate mixed pulmonary venous/pulmonary arterial hypertension. 4. Nonobstructive CAD.      Medications:     Scheduled Medications:  atorvastatin   10 mg Oral Daily   empagliflozin   10 mg Oral Daily   enoxaparin  (LOVENOX ) injection  40 mg Subcutaneous Q24H   furosemide   80 mg Intravenous BID   isosorbide -hydrALAZINE   1.5 tablet Oral TID   potassium chloride   40 mEq Oral Q4H   sacubitril -valsartan   1 tablet Oral BID   spironolactone   25 mg Oral Daily    Infusions:    PRN Medications: acetaminophen  **OR** acetaminophen , ALPRAZolam , hydrocortisone  cream  Patient Profile  Mr. Kamdin is a very pleasant 68 year old male with history of  hypertension, hyperlipidemia, type 2 diabetes, osteoarthritis currently admitted with acute systolic heart failure.   Assessment/Plan  Acute systolic heart failure - Echo showed EF <20%, LV with GHK, LV severely dilated, GIIDD, RV mod reduced, mod elevated PASP, LA/RA severely dilated, mild MR. TSH ok. HIV NR.  - NYHA IV on admission - CMR on 07/01/24 with severely dilated right ventricle with severely reduced function.  LVEF 15%. - RHC 07/02/2024 with severely elevated biventricular filling pressures and reduced cardiac index.  Due to elevated serum creatinine left heart cath deferred. - RHC repeat and LHC completed. Non obstructive CAD. Filling pressures elevated. Preserved CO - - Although his cardiac MRI is nonischemic he has significant conduction system disease with a QRS duration of 194 ms, need to rule out multivessel CAD.  Will repeat EKG prior to discharge.  There is some data for  biventricular pacing with excessively delayed ventricular conduction. Appears euvolemic. Stop IV lasix . Start torsemide  20 mg daily tomorrow. SABRA  GDMT   -Stop Bidil   - Increase Entresto  97-103 twice a day - Continue spironolactone  25 mg daily.  - Add coreg  3/125 mg twice a day  - Continue jardiance  10 mg dialy   2. HTN - . Stop Bidil   - Increase Entresto  97-103 twice a day - Continue spironolactone  25 mg daily.  - Add coreg  3/125 mg twice a day  - Avoid hypotension with AKI.    3. HLD - LDL 6/25 46 - Continue statin  4.  AKI -Followed by Dr Tobie.  -Continuing to improve; sCr 1.26 today.   Length of Stay: 7  Amy Clegg, NP  07/06/2024, 11:47 AM  Advanced Heart Failure Team Pager [phone] (M-F; 7a - 5p)  Please contact [facility] for night-coverage after hours (5p -7a ) and weekends on [email or web address]  Patient seen with NP, I formulated the plan and agree with the above note.   He does not feel quite as good as he did yesterday, not able to describe this though.  Walked in hall without significant dyspnea.  Good diuresis with IV Lasix .  Creatinine stable at 1.26.   General: NAD Neck: JVP 8-9 cm, no thyromegaly or thyroid  nodule.  Lungs: Clear to auscultation bilaterally with normal respiratory effort. CV: Nondisplaced PMI.  Heart regular S1/S2, no S3/S4, no murmur.  No peripheral edema.   Abdomen: Soft, nontender, no hepatosplenomegaly, no distention.  Skin: Intact without lesions or rashes.  Neurologic: Alert and oriented x 3.  Psych: Normal affect. Extremities: No clubbing or cyanosis.  HEENT: Normal.   Nonischemic cardiomyopathy.  Preserved cardiac output on RHC yesterday. Creatinine lower 1.26.  On exam, may have mild residual volume overload (good diuresis yesterday).  He does not feel as good as yesterday.  - Continue spironolactone , Jardiance .  - Lasix  80 mg IV bid for 2 doses today, transition to torsemide  tomorrow.   - Increase Entresto  to 97/103 bid.  - Stop  Bidil .  - Start Coreg  3.125 mg bid.  - Very wide QRS 192 msec but RBBB morphology.  Can discuss with EP benefit of CRT in this situation if EF remains low.    Nonobstructive CAD, continue statin and ASA.   As he does not feel as good today, will watch today on his GDMT regimen and give a bit more diuresis.  To po torsemide  tomorrow and hopefully discharge.   Ezra Shuck 07/06/2024 12:18 PM

## 2024-07-06 NOTE — TOC Initial Note (Signed)
 Transition of Care [facility]) - Initial/Assessment Note    Patient Details  Name: Jonathan Fowler MRN: [id number] Date of Birth: Feb 25, 1956  [facility] CM/SW Contact:    Justina Delcia Czar, RN Phone Number: [phone] 07/06/2024, 12:24 PM  Clinical Narrative:                 Inpatient CM spoke to pt and state he is independent pta. States no DME or HH needed. Has scale at home for daily weights. Provided pt with Living Better with HF booklet. Discussed low sodium/heart healthy diet. Pt states he will schedule hospital follow up appt with his PCP.   Will continue to follow for needs.   Expected Discharge Plan: Home/Self Care Barriers to Discharge: Continued Medical Work up   Patient Goals and CMS Choice Patient states their goals for this hospitalization and ongoing recovery are:: wantst to remain independent          Expected Discharge Plan and Services   Discharge Planning Services: CM Consult   Living arrangements for the past 2 months: Single Family Home                                      Prior Living Arrangements/Services Living arrangements for the past 2 months: Single Family Home Lives with:: Spouse Patient language and need for interpreter reviewed:: Yes Do you feel safe going back to the place where you live?: Yes      Need for Family Participation in Patient Care: No (Comment) Care giver support system in place?: No (comment) Current home services: DME (scale) Criminal Activity/Legal Involvement Pertinent to Current Situation/Hospitalization: No - Comment as needed  Activities of Daily Living   ADL Screening (condition at time of admission) Independently performs ADLs?: Yes (appropriate for developmental age) Is the patient deaf or have difficulty hearing?: No Does the patient have difficulty seeing, even when wearing glasses/contacts?: No Does the patient have difficulty concentrating, remembering, or making decisions?:  No  Permission Sought/Granted Permission sought to share information with : Case Manager, Family Supports, PCP Permission granted to share information with : Yes, Verbal Permission Granted  Share Information with NAME: Arnaldo Heffron  Permission granted to share info w AGENCY: Home Health, PCP, DME  Permission granted to share info w Relationship: wife  Permission granted to share info w Contact Information: [phone]  Emotional Assessment Appearance:: Appears stated age Attitude/Demeanor/Rapport: Engaged Affect (typically observed): Accepting Orientation: : Oriented to Self, Oriented to Place, Oriented to  Time, Oriented to Situation   Psych Involvement: No (comment)  Admission diagnosis:  Acute CHF (congestive heart failure) (HCC) [I50.9] New onset of congestive heart failure (HCC) [I50.9] Patient Active Problem List   Diagnosis Date Noted   Iron  deficiency anemia 07/02/2024   Essential hypertension 06/30/2024   Obesity, class 1 06/30/2024   Acute on chronic systolic CHF (congestive heart failure) (HCC) 06/29/2024   Chronic kidney disease, stage 3a (HCC) 06/29/2024   RBBB 06/29/2024   QT prolongation 06/29/2024   Type 2 diabetes mellitus with hyperlipidemia (HCC) 06/29/2024   Olecranon bursitis of left elbow 08/25/2017   PCP:  Regino Slater, MD Pharmacy:   [facility] [phone] - [place], [place] - [address] [place] [address] Phone: [phone] Fax: [phone]     Social Drivers of Health (SDOH)  Social History: SDOH Screenings   Food Insecurity: No Food Insecurity (06/30/2024)  Housing: Low Risk  (06/30/2024)  Transportation Needs: No Transportation Needs (06/30/2024)  Utilities: Not At Risk (06/30/2024)  Social Connections: Unknown (06/30/2024)  Tobacco Use: Low Risk  (06/30/2024)   SDOH Interventions:     Readmission Risk Interventions     No data to display

## 2024-07-07 ENCOUNTER — Other Ambulatory Visit (HOSPITAL_COMMUNITY): Payer: Self-pay

## 2024-07-07 DIAGNOSIS — I5023 Acute on chronic systolic (congestive) heart failure: Secondary | ICD-10-CM | POA: Diagnosis not present

## 2024-07-07 LAB — BASIC METABOLIC PANEL WITH GFR
Anion gap: 12 (ref 5–15)
BUN: 22 mg/dL (ref 8–23)
CO2: 27 mmol/L (ref 22–32)
Calcium: 9.4 mg/dL (ref 8.9–10.3)
Chloride: 99 mmol/L (ref 98–111)
Creatinine, Ser: 1.49 mg/dL — ABNORMAL HIGH (ref 0.61–1.24)
GFR, Estimated: 51 mL/min — ABNORMAL LOW (ref >60)
Glucose, Bld: 136 mg/dL — ABNORMAL HIGH (ref 70–99)
Potassium: 3.5 mmol/L (ref 3.5–5.1)
Sodium: 138 mmol/L (ref 135–145)

## 2024-07-07 LAB — MAGNESIUM: Magnesium: 2.3 mg/dL (ref 1.7–2.4)

## 2024-07-07 MED ORDER — TORSEMIDE 20 MG PO TABS
20.0000 mg | ORAL_TABLET | Freq: Every day | ORAL | 1 refills | Status: DC
  Filled 2024-07-07: qty 30, 30d supply, fill #0

## 2024-07-07 MED ORDER — POTASSIUM CHLORIDE CRYS ER 20 MEQ PO TBCR
40.0000 meq | EXTENDED_RELEASE_TABLET | Freq: Every day | ORAL | 1 refills | Status: DC
  Filled 2024-07-07: qty 60, 30d supply, fill #0

## 2024-07-07 MED ORDER — SACUBITRIL-VALSARTAN 97-103 MG PO TABS
1.0000 | ORAL_TABLET | Freq: Two times a day (BID) | ORAL | 1 refills | Status: DC
  Filled 2024-07-07: qty 60, 30d supply, fill #0

## 2024-07-07 MED ORDER — POTASSIUM CHLORIDE CRYS ER 20 MEQ PO TBCR
40.0000 meq | EXTENDED_RELEASE_TABLET | Freq: Every day | ORAL | Status: DC
  Administered 2024-07-07: 40 meq via ORAL
  Filled 2024-07-07: qty 2

## 2024-07-07 MED ORDER — EMPAGLIFLOZIN 10 MG PO TABS
10.0000 mg | ORAL_TABLET | Freq: Every day | ORAL | 1 refills | Status: DC
  Filled 2024-07-07: qty 30, 30d supply, fill #0

## 2024-07-07 MED ORDER — ASPIRIN 81 MG PO CHEW
81.0000 mg | CHEWABLE_TABLET | Freq: Every day | ORAL | 0 refills | Status: DC
  Filled 2024-07-07: qty 30, 30d supply, fill #0

## 2024-07-07 MED ORDER — SPIRONOLACTONE 25 MG PO TABS
25.0000 mg | ORAL_TABLET | Freq: Every day | ORAL | 1 refills | Status: DC
  Filled 2024-07-07: qty 30, 30d supply, fill #0

## 2024-07-07 NOTE — TOC Transition Note (Signed)
 Transition of Care [facility]) - Discharge Note   Patient Details  Name: Jonathan Fowler MRN: [id number] Date of Birth: 02-26-1956  Transition of [facility]) CM/SW Contact:  Roxie KANDICE Stain, RN Phone Number: 07/07/2024, 11:27 AM   Clinical Narrative:    Jonathan Fowler is stable to discharge home. Follow up apt on AVS. No ICM (Inpatient Care Management) needs at this time.   Final next level of care: Home/Self Care Barriers to Discharge: Barriers Resolved   Patient Goals and CMS Choice Patient states their goals for this hospitalization and ongoing recovery are:: wantst to remain independent          Discharge Placement                 home      Discharge Plan and Services Additional resources added to the After Visit Summary for     Discharge Planning Services: CM Consult                                 Social Drivers of Health (SDOH) Interventions SDOH Screenings   Food Insecurity: No Food Insecurity (06/30/2024)  Housing: Low Risk  (06/30/2024)  Transportation Needs: No Transportation Needs (06/30/2024)  Utilities: Not At Risk (06/30/2024)  Social Connections: Unknown (06/30/2024)  Tobacco Use: Low Risk  (06/30/2024)     Readmission Risk Interventions    07/07/2024   11:26 AM  Readmission Risk Prevention Plan  Post Dischage Appt Complete  Medication Screening Complete  Transportation Screening Complete

## 2024-07-07 NOTE — Progress Notes (Addendum)
 Advanced Heart Failure Rounding Note  Cardiologist: None  Chief Complaint: Acute Heart Failure  Subjective:   9/17- Diuresed with IV lasix .  9/18 Lasix  held due to creatinine bump. CMRI - NICM Biventricular HF.  9/22: Cath LHC nonobs CAD. Elevated filling pressures. Diuresed with IV lasix    Good diuresis again yesterday, 2.8L in UOP w/ IV Lasix . Wt down 9 lb.   Scr 1.3>>1.5 K 3.5 Mg 2.3   SBPs 110s-120s.   Feels much better. Breathing much improved. Ambulating w/o exertional dyspnea. Able to lay flat w/o orthopnea. Only complaint is L forearm discomfort from IV infiltration   Objective:   Weight Range: 97 kg Body mass index is 29.83 kg/m.   Vital Signs:   Temp:  [97.5 F (36.4 C)-98.2 F (36.8 C)] 97.5 F (36.4 C) (09/24 0737) Pulse Rate:  [76-91] 81 (09/24 0737) Resp:  [18-20] 19 (09/24 0737) BP: (109-142)/(75-93) 129/90 (09/24 0737) SpO2:  [92 %-95 %] 93 % (09/24 0737) FiO2 (%):  [21 %] 21 % (09/24 0149) Weight:  [97 kg] 97 kg (09/24 0436) Last BM Date : 07/04/24  Weight change: Filed Weights   07/04/24 0540 07/05/24 0500 07/07/24 0436  Weight: 100.8 kg 100.7 kg 97 kg    Intake/Output:   Intake/Output Summary (Last 24 hours) at 07/07/2024 1024 Last data filed at 07/07/2024 0742 Gross per 24 hour  Intake 716 ml  Output 2950 ml  Net -2234 ml    Physical Exam   Vitals:   07/07/24 0436 07/07/24 0737  BP: 114/75 (!) 129/90  Pulse: 76 81  Resp: 20 19  Temp: (!) 97.5 F (36.4 C) (!) 97.5 F (36.4 C)  SpO2: 92% 93%    GENERAL: NAD Lungs- clear CARDIAC:  JVP: 6-7 cm          Normal rate with regular rhythm. No MRG. No LEE  ABDOMEN: Soft, non-tender, non-distended.  EXTREMITIES: Warm and well perfused.  NEUROLOGIC: No obvious FND   Telemetry   SR 90s, personally reviewed   EKG   N/A  Labs    CBC Recent Labs    07/05/24 1256  HGB 11.9*  12.6*  HCT 35.0*  37.0*    Basic Metabolic Panel Recent Labs    90/76/74 0336  07/07/24 0329  NA 140 138  K 3.4* 3.5  CL 102 99  CO2 26 27  GLUCOSE 124* 136*  BUN 22 22  CREATININE 1.26* 1.49*  CALCIUM  8.9 9.4  MG 1.9 2.3    BNP (last 3 results) Recent Labs    06/29/24 1412  BNP 1,746.1*    No results for input(s): TSH, T4TOTAL, T3FREE, THYROIDAB in the last 72 hours.  Invalid input(s): FREET3   Other results:   Imaging    No results found.     Medications:     Scheduled Medications:  aspirin   81 mg Oral Daily   atorvastatin   10 mg Oral Daily   carvedilol   3.125 mg Oral BID WC   empagliflozin   10 mg Oral Daily   enoxaparin  (LOVENOX ) injection  40 mg Subcutaneous Q24H   sacubitril -valsartan   1 tablet Oral BID   spironolactone   25 mg Oral Daily   torsemide   20 mg Oral Daily    Infusions:    PRN Medications: acetaminophen  **OR** acetaminophen , ALPRAZolam , hydrocortisone  cream  Patient Profile  Jonathan Fowler is a very pleasant 68 year old male with history of hypertension, hyperlipidemia, type 2 diabetes, osteoarthritis currently admitted with acute systolic heart failure.   Assessment/Plan  Acute systolic heart failure - Echo showed EF <20%, LV with GHK, LV severely dilated, GIIDD, RV mod reduced, mod elevated PASP, LA/RA severely dilated, mild MR. TSH ok. HIV NR.  - NYHA IV on admission - CMR on 07/01/24 with severely dilated right ventricle with severely reduced function.  LVEF 15%. - RHC 07/02/2024 with severely elevated biventricular filling pressures and reduced cardiac index.  Due to elevated serum creatinine left heart cath initially deferred. - RHC repeated and LHC completed 9/22. Non obstructive CAD. Filling pressures elevated. Preserved CO - Has diuresed well w/ IV Lasix . Now euvolemic - Transition to torsemide  20 mg daily  - Continue Entresto  97-103 twice a day - Continue spironolactone  25 mg daily.  - Continue Coreg  3.125 mg bid  - Continue jardiance  10 mg dialy  - Very wide QRS 192 msec but RBBB morphology.   Can discuss with EP benefit of CRT in this situation if EF remains low.   2. HTN - controlled on current regimen - Continue Entresto  97-103 twice a day - Continue spironolactone  25 mg daily.  - Continue Coreg  3.25 mg twice a day    3. HLD - LDL 6/25 46 - Continue statin  4. CKD IIIa -Followed by Dr Tobie.  -SCr 1.4 on admission, stable 1.5 today  - on Jardiance     Stable for d/c home from HF standpoint. Will arrange close f/u in the [facility] and will place appt info in AVS.   Cardiac Meds for discharge ASA 81 mg daily  Atorvastatin  10 mg daily  Coreg  3.125 mg bid Jardiance  10 mg daily  KCl 40 meQ daily  Entresto  97-103 mg bid Spironolactone  25 mg daily  Torsemide  20 mg daily      Length of Stay: [address], PA-C  07/07/2024, 10:24 AM  Advanced Heart Failure Team Pager [phone] (M-F; 7a - 5p)  Please contact [facility] for night-coverage after hours (5p -7a ) and weekends on [email or web address]  Patient seen with PA, I formulated the plan and agree with the above note.   Good diuresis again today, weight down.  Feeling much better today.  Creatinine mildly higher at 1.49.   General: NAD Neck: No JVD, no thyromegaly or thyroid  nodule.  Lungs: Clear to auscultation bilaterally with normal respiratory effort. CV: Nondisplaced PMI.  Heart regular S1/S2, no S3/S4, no murmur.  No peripheral edema.   Abdomen: Soft, nontender, no hepatosplenomegaly, no distention.  Skin: Intact without lesions or rashes.  Neurologic: Alert and oriented x 3.  Psych: Normal affect. Extremities: No clubbing or cyanosis.  HEENT: Normal.   Euvolemic, has walked halls.  OK for home today.  He will take the above medications and follow closely in CHF clinic.   Ezra Shuck 07/07/2024 12:10 PM

## 2024-07-07 NOTE — Discharge Summary (Signed)
 Physician Discharge Summary  Jonathan Fowler FMW:[id number] DOB: [birth year] DOA: 06/29/2024  PCP: Regino Slater, MD  Admit date: 06/29/2024 Discharge date: 07/07/2024  Time spent: 45 minutes  Recommendations for Outpatient Follow-up:  Advanced heart failure clinic on 10/1, check BMP at follow-up   Discharge Diagnoses:  Principal Problem:   Acute on chronic systolic CHF (congestive heart failure) (HCC) Active Problems:   Essential hypertension   Chronic kidney disease, stage 3a (HCC)   Type 2 diabetes mellitus with hyperlipidemia (HCC)   Iron  deficiency anemia   Obesity, class 1   Discharge Condition: improved  Diet recommendation: Low-sodium, diabetic  Filed Weights   07/04/24 0540 07/05/24 0500 07/07/24 0436  Weight: 100.8 kg 100.7 kg 97 kg    History of present illness:  68/M w hypertension, hyperlipidemia, and T2DM who presented with dyspnea, orthopnea, PND and lower extremity edema. Na 138, K 3,8 Cl 105 bicarbonate 21, glucose 131, bun 29 cr 1.36  AST 29 ALT 27 BNP 1,746, troponin 48 and 44  CXR w mild cardiomegaly, with bilateral hilar vascular congestion, with no effusions or infiltrates.  -placed on IV furosemide  for diuresis.  Echocardiogram with reduced LV systolic function with biventricular failure.  -9/19 cardiac catheterization with elevated filling pressures.   Hospital Course:   Acute on chronic systolic CHF -Echo with EF <20%, global hypokinesis, grade II DD, RV moderate reduction,  -RHC-9/19-RV 64/10, PA 58/29 mean 40 , PCWP mean 27 Cardiac output 5,76 L/min and index 2.5 L/min/m2(Fick) -repeat R/LHC: Non obstructive coronary artery disease.  RV 61/13, PA 67/31 mean 42, PCWP mean 21 , CI-3 -Cardiac MRI -Reduced LV systolic function 16%, global hypokinesis, RV with severe dilatation with EF 29%, non ischemic cardiomyopathy.  - Improved with further diuresis, now felt to be euvolemic, transition to oral torsemide  -Continue Entresto , Aldactone ,  Jardiance , Coreg  -Discharged home in a stable condition, follow-up with advanced heart failure clinic on 10/1, check BMP   Essential hypertension -As above   Chronic kidney disease, stage 3a (HCC) AKI Hypokalemia. Hypomagnesemia - Stable improving, now switched to oral torsemide    Type 2 diabetes mellitus  Fasting glucose today is 93 mg/dl  Off insulin  therapy Hgb A1c is 5.5, continue Jardiance    Iron  deficiency anemia Serum iron  31, transferrin saturation 10, ferritin 26 and TIBC 301.  Hgb 11.2 -Given IV iron  this admission   Obesity, class 1 Calculated BMI is 33.6   Discharge Exam: Vitals:   07/07/24 0436 07/07/24 0737  BP: 114/75 (!) 129/90  Pulse: 76 81  Resp: 20 19  Temp: (!) 97.5 F (36.4 C) (!) 97.5 F (36.4 C)  SpO2: 92% 93%   Gen: Awake, Alert, Oriented X 3,  HEENT: no JVD Lungs: Good air movement bilaterally, CTAB CVS: S1S2/RRR Abd: soft, Non tender, non distended, BS present Extremities: No edema Skin: no new rashes on exposed skin   Discharge Instructions   Discharge Instructions     Diet - low sodium heart healthy   Complete by: As directed    Increase activity slowly   Complete by: As directed       Allergies as of 07/07/2024       Reactions   Loratadine    Headache   Lisinopril Cough   Mobic [meloxicam] Nausea And Vomiting   Penicillins Nausea Only, Rash   Has patient had a PCN reaction causing immediate rash, facial/tongue/throat swelling, SOB or lightheadedness with hypotension: No Has patient had a PCN reaction causing severe rash involving mucus membranes or skin  necrosis: No Has patient had a PCN reaction that required hospitalization: No Has patient had a PCN reaction occurring within the last 10 years: No If all of the above answers are NO, then may proceed with Cephalosporin use.        Medication List     STOP taking these medications    doxycycline 100 MG tablet Commonly known as: VIBRA-TABS   eplerenone 50 MG  tablet Commonly known as: INSPRA   furosemide  20 MG tablet Commonly known as: LASIX    hydrALAZINE  100 MG tablet Commonly known as: APRESOLINE    irbesartan  300 MG tablet Commonly known as: AVAPRO        TAKE these medications    aspirin  81 MG chewable tablet Chew 1 tablet (81 mg total) by mouth daily. Start taking on: July 08, 2024   atorvastatin  10 MG tablet Commonly known as: LIPITOR Take 10 mg by mouth daily.   carvedilol  3.125 MG tablet Commonly known as: COREG  Take 3.125 mg by mouth 2 (two) times daily with a meal.   Centrum Silver 50+Men Tabs Take 1 tablet by mouth daily.   empagliflozin  10 MG Tabs tablet Commonly known as: JARDIANCE  Take 1 tablet (10 mg total) by mouth daily. Start taking on: July 08, 2024   glucosamine-chondroitin 500-400 MG tablet Take 1 tablet by mouth in the morning and at bedtime.   metFORMIN 500 MG tablet Commonly known as: GLUCOPHAGE Take 500 mg by mouth in the morning and at bedtime.   methocarbamol 500 MG tablet Commonly known as: ROBAXIN Take 500 mg by mouth at bedtime as needed for muscle spasms (sleep).   potassium chloride  SA 20 MEQ tablet Commonly known as: KLOR-CON  M Take 2 tablets (40 mEq total) by mouth daily.   sacubitril -valsartan  97-103 MG Commonly known as: ENTRESTO  Take 1 tablet by mouth 2 (two) times daily.   spironolactone  25 MG tablet Commonly known as: ALDACTONE  Take 1 tablet (25 mg total) by mouth daily. Start taking on: July 08, 2024   torsemide  20 MG tablet Commonly known as: DEMADEX  Take 1 tablet (20 mg total) by mouth daily. Start taking on: July 08, 2024       Allergies  Allergen Reactions   Loratadine     Headache    Lisinopril Cough   Mobic [Meloxicam] Nausea And Vomiting   Penicillins Nausea Only and Rash    Has patient had a PCN reaction causing immediate rash, facial/tongue/throat swelling, SOB or lightheadedness with hypotension: No Has patient had a PCN  reaction causing severe rash involving mucus membranes or skin necrosis: No Has patient had a PCN reaction that required hospitalization: No Has patient had a PCN reaction occurring within the last 10 years: No If all of the above answers are NO, then may proceed with Cephalosporin use.    Follow-up Information     Koirala, Dibas, MD Follow up.   Specialty: Family Medicine Why: Please call to arrange hospital follow up appt in 7-14 days Contact information: [address] [place] [address] [phone]         [facility] Follow up.   Specialty: Cardiology Why: Hospital follow up in the Advanced Heart Failure Clinic at [facility] [place] [place] parking availiable)   07/14/24, 3:00 PM Contact information: [address] [place]  [phone]                 The results  of significant diagnostics from this hospitalization (including imaging, microbiology, ancillary and laboratory) are listed below for reference.    Significant Diagnostic Studies: CARDIAC CATHETERIZATION Result Date: 07/05/2024   Prox RCA to Mid RCA lesion is 30% stenosed.   Dist LAD lesion is 40% stenosed.   Prox LAD to Mid LAD lesion is 30% stenosed. 1. Filling pressures remain elevated. 2. Preserved cardiac output. 3. Moderate mixed pulmonary venous/pulmonary arterial hypertension. 4. Nonobstructive CAD.   CARDIAC CATHETERIZATION Result Date: 07/02/2024 HEMODYNAMICS: RA:   9 mmHg (mean) RV:   64/10 mmHg PA:   58/29 mmHg (40 mean) PCWP:  27 mmHg (mean)    Estimated Fick CO/CI   5.76 L/min, 2.55 L/min/m2 Thermodilution CO/CI   4.46 L/min, 2 L/min/m2    TPG    13  mmHg     PVR     2.25-2.9 Wood Units PAPi      3.2  IMPRESSION: Severely elevated pre and post capillary filling pressures Mildly elevated PVR secondary to group II PH 2/2 reduced cardiac output. Severely  reduced cardiac output by Thermodilution Aditya Sabharwal 9:44 AM  MR CARDIAC MORPHOLOGY W WO CONTRAST Result Date: 07/01/2024 CLINICAL DATA:  Heart failure, known or suspected, initial workup EXAM: MR CARDIAC VELOCITY FLOW MAPPING; MR CARDIA MORPHOLOGY WITHOUT AND WITH CONTRAST TECHNIQUE: The patient was scanned on a 1.5 Tesla Siemens magnet. A dedicated cardiac coil was used. Functional imaging was done using TrueFisp sequences. 2,3, and 4 chamber views were done to assess for RWMA's. Modified Simpson's rule using a short axis stack was used to calculate an ejection fraction on a dedicated work [employer]. The patient received 10mL GADAVIST  GADOBUTROL  1 MMOL/ML IV SOLN. After 10 minutes inversion recovery sequences were used to assess for infiltration and scar tissue. Phase contrast velocity encoded images obtained x 2. This examination is tailored for evaluation cardiac anatomy and function and provides very limited assessment of noncardiac structures, which are accordingly not evaluated during interpretation. If there is clinical concern for extracardiac pathology, further evaluation with CT imaging should be considered. FINDINGS: LEFT VENTRICLE: Severely dilated left ventricular cavity. Maximum septal wall thickness: 0.9 cm. Posterior wall thickness 0.7 cm. Left ventricular internal diameter (diastole): 8.1 cm. Global hypokinesis. LV EF: 16% (Normal 49-79%) Absolute volumes: LV EDV: 451 mL (Normal 95-215 mL) LV ESV: 378 mL (Normal 25-85 mL) LV SV: 73 mL (Normal 61-145 mL) CO: 5.7 L/min (Normal 3.4-7.8 L/min) Indexed volumes: LV EDV: 192 mL/sq-m (Normal 50-108 mL/sq-m) LV ESV: 161 mL/sq-m (Normal 11-47 mL/sq-m) LV SV: 31 mL/sq-m (Normal 33-72 mL/sq-m) CI: 2.4 L/min/sq-m (Normal 1.8-4.2 L/min/sq-m) RIGHT VENTRICLE: Severely dilated right ventricular cavity. Normal right ventricular wall thickness. Severely reduced right ventricular systolic function. There are no regional wall motion  abnormalities. RV EF:  29% (Normal 51-80%) Absolute volumes: RV EDV: 301 mL (Normal 109-217 mL) RV ESV: 215 mL (Normal 23-91 mL) RV SV: 86 mL (Normal 71-141 mL) CO: 6.7 L/min (Normal 2.8-8.8 L/min) Indexed volumes: RV EDV: 128 mL/sq-m (Normal 58-109 mL/sq-m) RV ESV: 92 mL/sq-m (Normal 12-46 mL/sq-m) RV SV: 37 mL/sq-m (Normal 38-71 mL/sq-m) CI: 2.9 L/min/sq-m (Normal 1.7-4.2 L/min/sq-m) Left atrium: Severely dilated. Right atrium: Severely dilated. Mitral valve: Normal mitral valve without mild regurgitation, RF 10%. Aortic valve: Tricuspid aortic valve with mild regurgitation, RF 14%. Tricuspid valve: Normal tricuspid valve with mild regurgitation, RF 15%. Pulmonic valve: Normal pulmonary valve with mild regurgitation, RF 9%. Aorta: The aortic root and proximal ascending aorta are normal in diameter. Pulmonary artery: Dilated suggestive  of pulmonary hypertension. Pericardium: The pericardium is normal thickness. There is no pericardial effusion. There is no evidence of intracardiac thrombus. Systemic flow across the aortic valve (QS) was 5.4 L/min. Pulmonary flow across the pulmonic valve (QP) was 5.9 L/min. The QP:QS calculated across the aortic and pulmonic valves was 1.17. Native myocardial T1-relaxation times were slightly elevated at 1096 ms. Native myocardial T2-relaxation times were normal at 49 ms. Delayed Enhancement: Faint mid wall LGE is present in the basal septum which is a non-specific finding in dilated cardiomyopathy. RV insertion LGE is present which is also a non-specific finding. Tiny, subendocardial LGE (<50%) is present in the basal anterolateral segment, possibly represents embolic infarct, and this area is viable. Extracardiac structures: No significant findings IMPRESSION: 1. The left ventricle is severely dilated with normal wall thickness. Systolic function was severely reduced, LVEF 16%. Global hypokinesis. 2. The right ventricle severely dilated with severely reduced function, RVEF 29%.  3. Mild mitral valve regurgitation, RF 10%. 4. Faint mid wall LGE is present in the basal septum which is a non-specific finding in non-ischemic cardiomyopathy. RV insertion LGE is present which is nonspecific. Tiny, subendocardial LGE (<50%) is present in the basal anterolateral segment, possibly represents embolic infarct, and this area is viable. Overall, findings are consistent with a nonischemic cardiomyopathy. No evidence of infiltrative disorder. Darryle Decent, MD Electronically Signed   By: Darryle Decent M.D.   On: 07/01/2024 16:38   MR CARDIAC VELOCITY FLOW MAP Result Date: 07/01/2024 CLINICAL DATA:  Heart failure, known or suspected, initial workup EXAM: MR CARDIAC VELOCITY FLOW MAPPING; MR CARDIA MORPHOLOGY WITHOUT AND WITH CONTRAST TECHNIQUE: The patient was scanned on a 1.5 Tesla Siemens magnet. A dedicated cardiac coil was used. Functional imaging was done using TrueFisp sequences. 2,3, and 4 chamber views were done to assess for RWMA's. Modified Simpson's rule using a short axis stack was used to calculate an ejection fraction on a dedicated work [employer]. The patient received 10mL GADAVIST  GADOBUTROL  1 MMOL/ML IV SOLN. After 10 minutes inversion recovery sequences were used to assess for infiltration and scar tissue. Phase contrast velocity encoded images obtained x 2. This examination is tailored for evaluation cardiac anatomy and function and provides very limited assessment of noncardiac structures, which are accordingly not evaluated during interpretation. If there is clinical concern for extracardiac pathology, further evaluation with CT imaging should be considered. FINDINGS: LEFT VENTRICLE: Severely dilated left ventricular cavity. Maximum septal wall thickness: 0.9 cm. Posterior wall thickness 0.7 cm. Left ventricular internal diameter (diastole): 8.1 cm. Global hypokinesis. LV EF: 16% (Normal 49-79%) Absolute volumes: LV EDV: 451 mL (Normal 95-215 mL) LV ESV: 378 mL  (Normal 25-85 mL) LV SV: 73 mL (Normal 61-145 mL) CO: 5.7 L/min (Normal 3.4-7.8 L/min) Indexed volumes: LV EDV: 192 mL/sq-m (Normal 50-108 mL/sq-m) LV ESV: 161 mL/sq-m (Normal 11-47 mL/sq-m) LV SV: 31 mL/sq-m (Normal 33-72 mL/sq-m) CI: 2.4 L/min/sq-m (Normal 1.8-4.2 L/min/sq-m) RIGHT VENTRICLE: Severely dilated right ventricular cavity. Normal right ventricular wall thickness. Severely reduced right ventricular systolic function. There are no regional wall motion abnormalities. RV EF:  29% (Normal 51-80%) Absolute volumes: RV EDV: 301 mL (Normal 109-217 mL) RV ESV: 215 mL (Normal 23-91 mL) RV SV: 86 mL (Normal 71-141 mL) CO: 6.7 L/min (Normal 2.8-8.8 L/min) Indexed volumes: RV EDV: 128 mL/sq-m (Normal 58-109 mL/sq-m) RV ESV: 92 mL/sq-m (Normal 12-46 mL/sq-m) RV SV: 37 mL/sq-m (Normal 38-71 mL/sq-m) CI: 2.9 L/min/sq-m (Normal 1.7-4.2 L/min/sq-m) Left atrium: Severely dilated. Right atrium: Severely dilated. Mitral  valve: Normal mitral valve without mild regurgitation, RF 10%. Aortic valve: Tricuspid aortic valve with mild regurgitation, RF 14%. Tricuspid valve: Normal tricuspid valve with mild regurgitation, RF 15%. Pulmonic valve: Normal pulmonary valve with mild regurgitation, RF 9%. Aorta: The aortic root and proximal ascending aorta are normal in diameter. Pulmonary artery: Dilated suggestive of pulmonary hypertension. Pericardium: The pericardium is normal thickness. There is no pericardial effusion. There is no evidence of intracardiac thrombus. Systemic flow across the aortic valve (QS) was 5.4 L/min. Pulmonary flow across the pulmonic valve (QP) was 5.9 L/min. The QP:QS calculated across the aortic and pulmonic valves was 1.17. Native myocardial T1-relaxation times were slightly elevated at 1096 ms. Native myocardial T2-relaxation times were normal at 49 ms. Delayed Enhancement: Faint mid wall LGE is present in the basal septum which is a non-specific finding in dilated cardiomyopathy. RV insertion LGE is  present which is also a non-specific finding. Tiny, subendocardial LGE (<50%) is present in the basal anterolateral segment, possibly represents embolic infarct, and this area is viable. Extracardiac structures: No significant findings IMPRESSION: 1. The left ventricle is severely dilated with normal wall thickness. Systolic function was severely reduced, LVEF 16%. Global hypokinesis. 2. The right ventricle severely dilated with severely reduced function, RVEF 29%. 3. Mild mitral valve regurgitation, RF 10%. 4. Faint mid wall LGE is present in the basal septum which is a non-specific finding in non-ischemic cardiomyopathy. RV insertion LGE is present which is nonspecific. Tiny, subendocardial LGE (<50%) is present in the basal anterolateral segment, possibly represents embolic infarct, and this area is viable. Overall, findings are consistent with a nonischemic cardiomyopathy. No evidence of infiltrative disorder. Darryle Decent, MD Electronically Signed   By: Darryle Decent M.D.   On: 07/01/2024 16:38   MR CARDIAC VELOCITY FLOW MAP Result Date: 07/01/2024 CLINICAL DATA:  Heart failure, known or suspected, initial workup EXAM: MR CARDIAC VELOCITY FLOW MAPPING; MR CARDIA MORPHOLOGY WITHOUT AND WITH CONTRAST TECHNIQUE: The patient was scanned on a 1.5 Tesla Siemens magnet. A dedicated cardiac coil was used. Functional imaging was done using TrueFisp sequences. 2,3, and 4 chamber views were done to assess for RWMA's. Modified Simpson's rule using a short axis stack was used to calculate an ejection fraction on a dedicated work [employer], [employer]. The patient received 10mL GADAVIST  GADOBUTROL  1 MMOL/ML IV SOLN. After 10 minutes inversion recovery sequences were used to assess for infiltration and scar tissue. Phase contrast velocity encoded images obtained x 2. This examination is tailored for evaluation cardiac anatomy and function and provides very limited assessment of noncardiac structures, which are  accordingly not evaluated during interpretation. If there is clinical concern for extracardiac pathology, further evaluation with CT imaging should be considered. FINDINGS: LEFT VENTRICLE: Severely dilated left ventricular cavity. Maximum septal wall thickness: 0.9 cm. Posterior wall thickness 0.7 cm. Left ventricular internal diameter (diastole): 8.1 cm. Global hypokinesis. LV EF: 16% (Normal 49-79%) Absolute volumes: LV EDV: 451 mL (Normal 95-215 mL) LV ESV: 378 mL (Normal 25-85 mL) LV SV: 73 mL (Normal 61-145 mL) CO: 5.7 L/min (Normal 3.4-7.8 L/min) Indexed volumes: LV EDV: 192 mL/sq-m (Normal 50-108 mL/sq-m) LV ESV: 161 mL/sq-m (Normal 11-47 mL/sq-m) LV SV: 31 mL/sq-m (Normal 33-72 mL/sq-m) CI: 2.4 L/min/sq-m (Normal 1.8-4.2 L/min/sq-m) RIGHT VENTRICLE: Severely dilated right ventricular cavity. Normal right ventricular wall thickness. Severely reduced right ventricular systolic function. There are no regional wall motion abnormalities. RV EF:  29% (Normal 51-80%) Absolute volumes: RV EDV: 301 mL (Normal 109-217 mL) RV ESV: 215  mL (Normal 23-91 mL) RV SV: 86 mL (Normal 71-141 mL) CO: 6.7 L/min (Normal 2.8-8.8 L/min) Indexed volumes: RV EDV: 128 mL/sq-m (Normal 58-109 mL/sq-m) RV ESV: 92 mL/sq-m (Normal 12-46 mL/sq-m) RV SV: 37 mL/sq-m (Normal 38-71 mL/sq-m) CI: 2.9 L/min/sq-m (Normal 1.7-4.2 L/min/sq-m) Left atrium: Severely dilated. Right atrium: Severely dilated. Mitral valve: Normal mitral valve without mild regurgitation, RF 10%. Aortic valve: Tricuspid aortic valve with mild regurgitation, RF 14%. Tricuspid valve: Normal tricuspid valve with mild regurgitation, RF 15%. Pulmonic valve: Normal pulmonary valve with mild regurgitation, RF 9%. Aorta: The aortic root and proximal ascending aorta are normal in diameter. Pulmonary artery: Dilated suggestive of pulmonary hypertension. Pericardium: The pericardium is normal thickness. There is no pericardial effusion. There is no evidence of intracardiac thrombus.  Systemic flow across the aortic valve (QS) was 5.4 L/min. Pulmonary flow across the pulmonic valve (QP) was 5.9 L/min. The QP:QS calculated across the aortic and pulmonic valves was 1.17. Native myocardial T1-relaxation times were slightly elevated at 1096 ms. Native myocardial T2-relaxation times were normal at 49 ms. Delayed Enhancement: Faint mid wall LGE is present in the basal septum which is a non-specific finding in dilated cardiomyopathy. RV insertion LGE is present which is also a non-specific finding. Tiny, subendocardial LGE (<50%) is present in the basal anterolateral segment, possibly represents embolic infarct, and this area is viable. Extracardiac structures: No significant findings IMPRESSION: 1. The left ventricle is severely dilated with normal wall thickness. Systolic function was severely reduced, LVEF 16%. Global hypokinesis. 2. The right ventricle severely dilated with severely reduced function, RVEF 29%. 3. Mild mitral valve regurgitation, RF 10%. 4. Faint mid wall LGE is present in the basal septum which is a non-specific finding in non-ischemic cardiomyopathy. RV insertion LGE is present which is nonspecific. Tiny, subendocardial LGE (<50%) is present in the basal anterolateral segment, possibly represents embolic infarct, and this area is viable. Overall, findings are consistent with a nonischemic cardiomyopathy. No evidence of infiltrative disorder. Darryle Decent, MD Electronically Signed   By: Darryle Decent M.D.   On: 07/01/2024 16:38   ECHOCARDIOGRAM COMPLETE Result Date: 06/30/2024    ECHOCARDIOGRAM REPORT   Patient Name:   Jonathan Fowler Date of Exam: 06/30/2024 Medical Rec #:  [id number]          Height:       71.0 in Accession #:    [id number]         Weight:       241.0 lb Date of Birth:  [birth year]          BSA:          2.282 m Patient Age:    68 years           BP:           159/104 mmHg Patient Gender: M                  HR:           73 bpm. Exam Location:  Inpatient  Procedure: 2D Echo and Intracardiac Opacification Agent (Both Spectral and Color            Flow Doppler were utilized during procedure). Indications:    CHF  History:        Patient has no prior history of Echocardiogram examinations.                 Risk Factors:Hypertension.  Sonographer:    Charmaine Gaskins Referring Phys: [id number] EDITHA RATHORE  IMPRESSIONS  1. Left ventricular ejection fraction, by estimation, is <20%. Left ventricular ejection fraction by 2D MOD biplane is 17.1 %. The left ventricle has severely decreased function. The left ventricle demonstrates global hypokinesis. The left ventricular internal cavity size was severely dilated. Left ventricular diastolic parameters are consistent with Grade II diastolic dysfunction (pseudonormalization). Elevated left ventricular end-diastolic pressure. The average left ventricular global longitudinal strain is -7.8 %. The global longitudinal strain is abnormal.  2. Right ventricular systolic function is moderately reduced. The right ventricular size is normal. There is moderately elevated pulmonary artery systolic pressure.  3. Left atrial size was severely dilated.  4. Right atrial size was severely dilated.  5. The mitral valve is normal in structure. Mild mitral valve regurgitation. No evidence of mitral stenosis.  6. The aortic valve is tricuspid. Aortic valve regurgitation is not visualized. No aortic stenosis is present.  7. The inferior vena cava is dilated in size with <50% respiratory variability, suggesting right atrial pressure of 15 mmHg. FINDINGS  Left Ventricle: Left ventricular ejection fraction, by estimation, is <20%. Left ventricular ejection fraction by 2D MOD biplane is 17.1 %. The left ventricle has severely decreased function. The left ventricle demonstrates global hypokinesis. Definity  contrast agent was given IV to delineate the left ventricular endocardial borders. The average left ventricular global longitudinal strain is -7.8 %.  Strain was performed and the global longitudinal strain is abnormal. The left ventricular internal cavity size was severely dilated. There is no left ventricular hypertrophy. Left ventricular diastolic parameters are consistent with Grade II diastolic dysfunction (pseudonormalization). Elevated left ventricular end-diastolic pressure. Right Ventricle: The right ventricular size is normal. No increase in right ventricular wall thickness. Right ventricular systolic function is moderately reduced. There is moderately elevated pulmonary artery systolic pressure. The tricuspid regurgitant velocity is 2.81 m/s, and with an assumed right atrial pressure of 15 mmHg, the estimated right ventricular systolic pressure is 46.6 mmHg. Left Atrium: Left atrial size was severely dilated. Right Atrium: Right atrial size was severely dilated. Pericardium: There is no evidence of pericardial effusion. Mitral Valve: The mitral valve is normal in structure. Mild mitral valve regurgitation. No evidence of mitral valve stenosis. Tricuspid Valve: The tricuspid valve is normal in structure. Tricuspid valve regurgitation is mild . No evidence of tricuspid stenosis. Aortic Valve: The aortic valve is tricuspid. Aortic valve regurgitation is not visualized. No aortic stenosis is present. Aortic valve mean gradient measures 3.0 mmHg. Aortic valve peak gradient measures 5.0 mmHg. Aortic valve area, by VTI measures 2.64 cm. Pulmonic Valve: The pulmonic valve was normal in structure. Pulmonic valve regurgitation is mild. No evidence of pulmonic stenosis. Aorta: The aortic root is normal in size and structure. Venous: The inferior vena cava is dilated in size with less than 50% respiratory variability, suggesting right atrial pressure of 15 mmHg. IAS/Shunts: No atrial level shunt detected by color flow Doppler.  LEFT VENTRICLE PLAX 2D                        Biplane EF (MOD) LVIDd:         6.90 cm         LV Biplane EF:   Left LVIDs:         6.40 cm                           ventricular LV PW:         1.10  cm                          ejection LV IVS:        1.20 cm                          fraction by LVOT diam:     2.30 cm                          2D MOD LV SV:         54                               biplane is LV SV Index:   24                               17.1 %. LVOT Area:     4.15 cm                                Diastology                                LV e' medial:    3.53 cm/s LV Volumes (MOD)               LV E/e' medial:  27.3 LV vol d, MOD    260.0 ml      LV e' lateral:   8.55 cm/s A2C:                           LV E/e' lateral: 11.3 LV vol d, MOD    253.0 ml A4C:                           2D Longitudinal LV vol s, MOD    210.0 ml      Strain A2C:                           2D Strain GLS   -7.0 % LV vol s, MOD    204.0 ml      (A4C): A4C:                           2D Strain GLS   -7.6 % LV SV MOD A2C:   50.0 ml       (A3C): LV SV MOD A4C:   253.0 ml      2D Strain GLS   -8.6 % LV SV MOD BP:    44.1 ml       (A2C):                                2D Strain GLS   -7.8 %                                Avg: RIGHT VENTRICLE RV Basal diam:  4.20 cm RV  Mid diam:    4.60 cm RV S prime:     10.40 cm/s LEFT ATRIUM              Index        RIGHT ATRIUM           Index LA diam:        5.00 cm  2.19 cm/m   RA Area:     25.90 cm LA Vol (A2C):   117.0 ml 51.26 ml/m  RA Volume:   89.40 ml  39.17 ml/m LA Vol (A4C):   122.0 ml 53.45 ml/m LA Biplane Vol: 120.0 ml 52.57 ml/m  AORTIC VALVE AV Area (Vmax):    3.11 cm AV Area (Vmean):   2.66 cm AV Area (VTI):     2.64 cm AV Vmax:           112.00 cm/s AV Vmean:          86.700 cm/s AV VTI:            0.206 m AV Peak Grad:      5.0 mmHg AV Mean Grad:      3.0 mmHg LVOT Vmax:         83.80 cm/s LVOT Vmean:        55.600 cm/s LVOT VTI:          0.131 m LVOT/AV VTI ratio: 0.64  AORTA Ao Asc diam: 3.80 cm MITRAL VALVE               TRICUSPID VALVE MV Area (PHT): 5.27 cm    TR Peak grad:   31.6 mmHg MV Decel Time:  144 msec    TR Vmax:        281.00 cm/s MV E velocity: 96.40 cm/s MV A velocity: 44.50 cm/s  SHUNTS MV E/A ratio:  2.17        Systemic VTI:  0.13 m                            Systemic Diam: 2.30 cm Annabella Scarce MD Electronically signed by Annabella Scarce MD Signature Date/Time: 06/30/2024/1:05:56 PM    Final    DG Chest 2 View Result Date: 06/29/2024 EXAM: 2 VIEW(S) XRAY OF THE CHEST 06/29/2024 02:47:00 PM COMPARISON: 04/02/2024 CLINICAL HISTORY: SOB. Triage notes:Pt BIB EMS from [facility], pt having exertional SOB, worse when laying but ok when sitting up x 2 weeks. No chest pain. EKG at doctors office showed new right bundle branch block. 324 ASA 500 NS given. FINDINGS: LUNGS AND PLEURA: Mild central pulmonary vascular congestion without overt pulmonary edema. No focal pulmonary opacity. No pleural effusion. No pneumothorax. HEART AND MEDIASTINUM: No acute abnormality of the cardiac and mediastinal silhouettes. Aortic atherosclerosis. BONES AND SOFT TISSUES: Right shoulder arthroplasty noted. No acute osseous abnormality. IMPRESSION: 1. Mild central pulmonary vascular congestion without overt pulmonary edema. Electronically signed by: Donnice Mania MD 06/29/2024 03:08 PM EDT RP Workstation: [id number]    Microbiology: Recent Results (from the past 240 hours)  Surgical pcr screen     Status: None   Collection Time: 06/30/24  2:29 PM   Specimen: Nasal Mucosa; Nasal Swab  Result Value Ref Range Status   MRSA, PCR NEGATIVE NEGATIVE Final   Staphylococcus aureus NEGATIVE NEGATIVE Final    Comment: (NOTE) The Xpert SA Assay (FDA approved for NASAL specimens in patients 69 years of age and older), is one component of a comprehensive surveillance program.  It is not intended to diagnose infection nor to guide or monitor treatment. Performed at [facility], [address] [place], [place] [address]      Labs: Basic Metabolic Panel: Recent Labs  Lab 07/03/24 0222  07/04/24 0142 07/05/24 0225 07/05/24 1256 07/06/24 0336 07/07/24 0329  NA 142 141 140 144  142 140 138  K 3.0* 3.7 3.2* 3.0*  3.2* 3.4* 3.5  CL 101 102 102  --  102 99  CO2 29 30 26   --  26 27  GLUCOSE 98 100* 93  --  124* 136*  BUN 24* 21 27*  --  22 22  CREATININE 1.56* 1.49* 1.37*  --  1.26* 1.49*  CALCIUM  8.4* 8.6* 8.7*  --  8.9 9.4  MG 1.8 2.0 2.0  --  1.9 2.3   Liver Function Tests: No results for input(s): AST, ALT, ALKPHOS, BILITOT, PROT, ALBUMIN in the last 168 hours. No results for input(s): LIPASE, AMYLASE in the last 168 hours. No results for input(s): AMMONIA in the last 168 hours. CBC: Recent Labs  Lab 07/02/24 0919 07/05/24 1256  HGB 11.2*  11.2* 11.9*  12.6*  HCT 33.0*  33.0* 35.0*  37.0*   Cardiac Enzymes: No results for input(s): CKTOTAL, CKMB, CKMBINDEX, TROPONINI in the last 168 hours. BNP: BNP (last 3 results) Recent Labs    06/29/24 1412  BNP 1,746.1*    ProBNP (last 3 results) No results for input(s): PROBNP in the last 8760 hours.  CBG: Recent Labs  Lab 07/02/24 0604 07/02/24 1109 07/02/24 1622 07/02/24 2116 07/03/24 0630  GLUCAP 92 147* 105* 120* 106*       Signed:  Sigurd Pac MD.  [facility] 07/07/2024, 11:01 AM

## 2024-07-12 DIAGNOSIS — E119 Type 2 diabetes mellitus without complications: Secondary | ICD-10-CM | POA: Diagnosis not present

## 2024-07-12 DIAGNOSIS — I5082 Biventricular heart failure: Secondary | ICD-10-CM | POA: Diagnosis not present

## 2024-07-12 DIAGNOSIS — I10 Essential (primary) hypertension: Secondary | ICD-10-CM | POA: Diagnosis not present

## 2024-07-12 DIAGNOSIS — E269 Hyperaldosteronism, unspecified: Secondary | ICD-10-CM | POA: Diagnosis not present

## 2024-07-12 LAB — HEMOGLOBIN A1C: Hemoglobin A1C: 6

## 2024-07-12 NOTE — Progress Notes (Signed)
 ADVANCED HF CLINIC CONSULT NOTE  Referring Physician: Regino Slater, MD Primary Care: Regino Slater, MD Primary Cardiologist: None AHF Cardiolgist: Ria Commander, DO  Chief Complaint: recently diagnosed HFrEF HPI: Jonathan Fowler is a 68 y.o. male with HTN, HLD, DMII, osteoarthritis and vertigo.   Admitted 9/25 with acute systolic heart failure. Took BP meds at home PTA and reported full compliance with them. Echo showed EF <20%, LV with GHK, LV severely dilated, GIIDD, RV mod reduced, mod elevated PASP, LA/RA severely dilated, mild MR.  AHF team asked to see.  RHC 07/02/2024 with severely elevated biventricular filling pressures and reduced cardiac index, LHC deferred with elevated renal function. Diuresed well with IV lasix , renal function improved. cMRI with NiCM BiV HF, EF 15%. [facility] 07/05/24 with nonobs CAD, elevated filling pressures and preserved CO. Required further diuresis. GDMT started.   Today he returns for post hospital follow up. Overall feeling good. Denies palpitations, CP, dizziness, edema, or PND/Orthopnea. No SOB. Appetite ok. No fever or chills. Weight at home 215-217 pounds. Taking all medications except he was only taking Entresto  daily as he overlooked BID on the bottle. Denies ETOH, tobacco or drug use. SBP at home 110s-129s. Stays active by walking around the house. Drinks <64 oz/day.    Past Medical History:  Diagnosis Date   High cholesterol    Hypertension    Vertigo     Current Outpatient Medications  Medication Sig Dispense Refill   aspirin  81 MG chewable tablet Chew 1 tablet (81 mg total) by mouth daily. 30 tablet 0   atorvastatin  (LIPITOR) 10 MG tablet Take 10 mg by mouth daily.  2   carvedilol  (COREG ) 3.125 MG tablet Take 3.125 mg by mouth 2 (two) times daily with a meal.     empagliflozin  (JARDIANCE ) 10 MG TABS tablet Take 1 tablet (10 mg total) by mouth daily. 30 tablet 1   glucosamine-chondroitin 500-400 MG tablet Take 1 tablet by mouth in  the morning and at bedtime.     metFORMIN (GLUCOPHAGE) 500 MG tablet Take 500 mg by mouth in the morning and at bedtime.     methocarbamol (ROBAXIN) 500 MG tablet Take 500 mg by mouth at bedtime as needed for muscle spasms (sleep).     Multiple Vitamins-Minerals (CENTRUM SILVER 50+MEN) TABS Take 1 tablet by mouth daily.     potassium chloride  SA (KLOR-CON  M) 20 MEQ tablet Take 2 tablets (40 mEq total) by mouth daily. 60 tablet 1   sacubitril -valsartan  (ENTRESTO ) 97-103 MG Take 1 tablet by mouth 2 (two) times daily. 60 tablet 1   spironolactone  (ALDACTONE ) 25 MG tablet Take 1 tablet (25 mg total) by mouth daily. 30 tablet 1   Cholecalciferol (VITAMIN D3) 50 MCG (2000 UT) capsule Take 2,000 Units by mouth daily. (Patient not taking: Reported on 07/14/2024)     torsemide  (DEMADEX ) 20 MG tablet Take 1 tablet (20 mg total) by mouth daily. May take an extra 20 mg if needed for edema 90 tablet 3   No current facility-administered medications for this encounter.    Allergies  Allergen Reactions   Loratadine     Headache    Lisinopril Cough   Mobic [Meloxicam] Nausea And Vomiting   Penicillins Nausea Only and Rash    Has patient had a PCN reaction causing immediate rash, facial/tongue/throat swelling, SOB or lightheadedness with hypotension: No Has patient had a PCN reaction causing severe rash involving mucus membranes or skin necrosis: No Has patient had a PCN reaction that  required hospitalization: No Has patient had a PCN reaction occurring within the last 10 years: No If all of the above answers are NO, then may proceed with Cephalosporin use.      Social History   Socioeconomic History   Marital status: Married    Spouse name: Not on file   Number of children: Not on file   Years of education: Not on file   Highest education level: Not on file  Occupational History   Not on file  Tobacco Use   Smoking status: Never   Smokeless tobacco: Never  Substance and Sexual Activity    Alcohol use: Yes    Comment: socially   Drug use: No   Sexual activity: Not on file  Other Topics Concern   Not on file  Social History Narrative   Not on file   Social Drivers of Health   Financial Resource Strain: Not on file  Food Insecurity: No Food Insecurity (06/30/2024)   Hunger Vital Sign    Worried About Running Out of Food in the Last Year: Never true    Ran Out of Food in the Last Year: Never true  Transportation Needs: No Transportation Needs (06/30/2024)   PRAPARE - Administrator, Civil Service (Medical): No    Lack of Transportation (Non-Medical): No  Physical Activity: Not on file  Stress: Not on file  Social Connections: Unknown (06/30/2024)   Social Connection and Isolation Panel    Frequency of Communication with Friends and Family: Never    Frequency of Social Gatherings with Friends and Family: Never    Attends Religious Services: Never    Database administrator or Organizations: Patient declined    Attends Banker Meetings: Patient declined    Marital Status: Married  Catering manager Violence: Not At Risk (06/30/2024)   Humiliation, Afraid, Rape, and Kick questionnaire    Fear of Current or Ex-Partner: No    Emotionally Abused: No    Physically Abused: No    Sexually Abused: No     History reviewed. No pertinent family history. No family history of heart disease, dad passed away from sepsis and mom passed away from brain tumor.  Vitals:   07/14/24 1511  BP: (!) 148/88  Pulse: 77  SpO2: 99%  Weight: 99.3 kg (219 lb)  Height: 5' 11 (1.803 m)    PHYSICAL EXAM: General:  well appearing.  No respiratory difficulty. Walked into clinic.  Neck: JVD ~8/9 cm.  Cor: Regular rate & rhythm. No murmurs. Lungs: clear, crackles in LLL.  Extremities: +1 ankle edema  Neuro: alert & oriented x 3. Affect pleasant.   ECG: NSR, possible LAE, RBBB, bifascicular block, LVH (Personally reviewed)    ASSESSMENT & PLAN: Chronic systolic  heart failure, NiCM - Echo 9/25 EF <20%, LV with GHK, LV severely dilated, GIIDD, RV mod reduced, mod elevated PASP, LA/RA severely dilated, mild MR. TSH ok. HIV NR.  - CMR on 07/01/24 with severely dilated right ventricle with severely reduced function.  LVEF 15%. - RHC 07/02/2024 with severely elevated biventricular filling pressures and reduced cardiac index.  Due to elevated serum creatinine left heart cath initially deferred. - RHC repeated and LHC completed 9/22. Non obstructive CAD. Filling pressures elevated. Preserved CO - NYHA I-II - Euvolemic on exam - Continue torsemide  20 mg daily. Discussed PRN indications today, ok to take additional torsemide  for 3lb overnight weight gain or 5lb weight gain in 1 week. I asked him to take  an additional 20 mg torsemide  today with 20 mEq KDUR.  - Increase Entresto  97-103 daily> BID (he overlooked BID dosing) - Continue spironolactone  25 mg daily.  - Continue Coreg  3.125 mg bid  - Continue jardiance  10 mg daily. Denies GU symptoms - Very wide QRS 206 msec but RBBB morphology.  Can discuss with EP benefit of CRT in this situation if EF remains low.  - Plan for repeat echo in ~3 months.  - Labs reviewed from PCP: 07/12/24 K 4.1, SCr 1.46, Na 139, AST 26, ALT 22, Hgb A1c 6, Hgb 14.5 - Referral placed today for cardiac rehab.    2. HTN - Mildly elevated today in the setting of not taking Entresto  BID - Continue Entresto  97-103 twice a day, changes as above - Continue spironolactone  25 mg daily.  - Continue Coreg  3.25 mg twice a day     3. HLD - LDL 6/25 46 - Continue statin   4. CKD IIIa -Followed by Dr Tobie.  -SCr 1.4 on admission, last 1.5  - Continue Jardiance    - 1.46 at PCP 07/12/24  Follow up in 4 weeks with APP to titrate GDMT (increase coreg ). Follow up in 8-10 weeks with Dr. Gardenia + echo.   Ok for him to go back to driving. Asked him to wear compression socks as he's on the road for about 200 miles/day.   Beckey LITTIE Coe AGACNP-BC   [facility] [address] Heart and V[facility] [place] [address] [phone] (office)

## 2024-07-13 ENCOUNTER — Telehealth (HOSPITAL_COMMUNITY): Payer: Self-pay

## 2024-07-13 NOTE — Telephone Encounter (Signed)
 Called to confirm/remind patient of their appointment at the [facility] on 07/14/24 3:30.   Appointment:   [x] Confirmed  [] Left mess   [] No answer/No voice mail  [] VM Full/unable to leave message  [] Phone not in service  Patient reminded to bring all medications and/or complete list.  Confirmed patient has transportation. Gave directions, instructed to utilize valet parking.

## 2024-07-14 ENCOUNTER — Ambulatory Visit (HOSPITAL_COMMUNITY)
Admit: 2024-07-14 | Discharge: 2024-07-14 | Disposition: A | Discharge status: Home / Self Care | Attending: Internal Medicine | Admitting: Internal Medicine

## 2024-07-14 ENCOUNTER — Other Ambulatory Visit (HOSPITAL_COMMUNITY): Payer: Self-pay | Admitting: Internal Medicine

## 2024-07-14 ENCOUNTER — Encounter (HOSPITAL_COMMUNITY): Payer: Self-pay

## 2024-07-14 VITALS — BP 148/88 | HR 77 | Ht 71.0 in | Wt 219.0 lb

## 2024-07-14 DIAGNOSIS — I10 Essential (primary) hypertension: Secondary | ICD-10-CM

## 2024-07-14 DIAGNOSIS — E785 Hyperlipidemia, unspecified: Secondary | ICD-10-CM | POA: Insufficient documentation

## 2024-07-14 DIAGNOSIS — I452 Bifascicular block: Secondary | ICD-10-CM | POA: Diagnosis not present

## 2024-07-14 DIAGNOSIS — E1122 Type 2 diabetes mellitus with diabetic chronic kidney disease: Secondary | ICD-10-CM | POA: Insufficient documentation

## 2024-07-14 DIAGNOSIS — Z7984 Long term (current) use of oral hypoglycemic drugs: Secondary | ICD-10-CM | POA: Diagnosis not present

## 2024-07-14 DIAGNOSIS — M1990 Unspecified osteoarthritis, unspecified site: Secondary | ICD-10-CM | POA: Insufficient documentation

## 2024-07-14 DIAGNOSIS — Z7982 Long term (current) use of aspirin: Secondary | ICD-10-CM | POA: Insufficient documentation

## 2024-07-14 DIAGNOSIS — I110 Hypertensive heart disease with heart failure: Secondary | ICD-10-CM | POA: Diagnosis not present

## 2024-07-14 DIAGNOSIS — I428 Other cardiomyopathies: Secondary | ICD-10-CM | POA: Diagnosis not present

## 2024-07-14 DIAGNOSIS — N1831 Chronic kidney disease, stage 3a: Secondary | ICD-10-CM | POA: Insufficient documentation

## 2024-07-14 DIAGNOSIS — I130 Hypertensive heart and chronic kidney disease with heart failure and stage 1 through stage 4 chronic kidney disease, or unspecified chronic kidney disease: Secondary | ICD-10-CM | POA: Insufficient documentation

## 2024-07-14 DIAGNOSIS — R42 Dizziness and giddiness: Secondary | ICD-10-CM | POA: Diagnosis not present

## 2024-07-14 DIAGNOSIS — Z79899 Other long term (current) drug therapy: Secondary | ICD-10-CM | POA: Insufficient documentation

## 2024-07-14 DIAGNOSIS — I5022 Chronic systolic (congestive) heart failure: Secondary | ICD-10-CM | POA: Diagnosis not present

## 2024-07-14 MED ORDER — SACUBITRIL-VALSARTAN 97-103 MG PO TABS: 1.0000 | ORAL_TABLET | Freq: Two times a day (BID) | ORAL | 11 refills | Status: DC

## 2024-07-14 MED ORDER — SPIRONOLACTONE 25 MG PO TABS: 25.0000 mg | ORAL_TABLET | Freq: Every day | ORAL | 3 refills | Status: DC

## 2024-07-14 MED ORDER — TORSEMIDE 20 MG PO TABS: 20.0000 mg | ORAL_TABLET | Freq: Every day | ORAL | 3 refills | Status: DC

## 2024-07-14 MED ORDER — POTASSIUM CHLORIDE CRYS ER 20 MEQ PO TBCR
40.0000 meq | EXTENDED_RELEASE_TABLET | Freq: Every day | ORAL | 3 refills | Status: AC
Start: 2024-07-14 — End: ?

## 2024-07-14 MED ORDER — ASPIRIN 81 MG PO CHEW: 81.0000 mg | CHEWABLE_TABLET | Freq: Every day | ORAL | 3 refills | Status: DC

## 2024-07-14 MED ORDER — CARVEDILOL 3.125 MG PO TABS: 3.1250 mg | ORAL_TABLET | Freq: Two times a day (BID) | ORAL | 3 refills | Status: DC

## 2024-07-14 MED ORDER — ATORVASTATIN CALCIUM 10 MG PO TABS: 10.0000 mg | ORAL_TABLET | Freq: Every day | ORAL | 3 refills | Status: DC

## 2024-07-14 MED ORDER — EMPAGLIFLOZIN 10 MG PO TABS: 10.0000 mg | ORAL_TABLET | Freq: Every day | ORAL | 11 refills | Status: DC

## 2024-07-14 NOTE — Addendum Note (Signed)
 Encounter addended by: Micael Sueanne LABOR, CMA on: 07/14/2024 3:56 PM  Actions taken: OB Results Console updated

## 2024-07-14 NOTE — Addendum Note (Signed)
 Encounter addended by: Marcelina Lisa HERO, RN on: 07/14/2024 3:57 PM  Actions taken: Order list changed

## 2024-07-14 NOTE — Patient Instructions (Signed)
 PLEASE MAKE SURE YOU TAKE YOUR ENTRESTO  Twice daily  TAKE AN EXTRA TORSEMIDE  20 MG AND POTASSIUM 20 mEq ( 1 TAB) TODAY ONLY.  Your physician has requested that you have an echocardiogram. Echocardiography is a painless test that uses sound waves to create images of your heart. It provides your doctor with information about the size and shape of your heart and how well your heart's chambers and valves are working. This procedure takes approximately one hour. There are no restrictions for this procedure. Please do NOT wear cologne, perfume, aftershave, or lotions (deodorant is allowed). Please arrive 15 minutes prior to your appointment time.  Please note: We ask at that you not bring children with you during ultrasound (echo/ vascular) testing. Due to room size and safety concerns, children are not allowed in the ultrasound rooms during exams. Our front office staff cannot provide observation of children in our lobby area while testing is being conducted. An adult accompanying a patient to their appointment will only be allowed in the ultrasound room at the discretion of the ultrasound technician under special circumstances. We apologize for any inconvenience.  You have been referred to Cardiac Rehab. They will call you to arrange your appointment.  Your physician recommends that you schedule a follow-up appointment in: as scheduled.  If you have any questions or concerns before your next appointment please send us  a message through [place] or call our office at [phone].    TO LEAVE A MESSAGE FOR THE NURSE SELECT OPTION 2, PLEASE LEAVE A MESSAGE INCLUDING: YOUR NAME DATE OF BIRTH CALL BACK NUMBER REASON FOR CALL**this is important as we prioritize the call backs  YOU WILL RECEIVE A CALL BACK THE SAME DAY AS LONG AS YOU CALL BEFORE 4:00 PM  At the [facility], you and your health needs are our priority. As part of our continuing mission to provide you with exceptional  heart care, we have created designated Provider Care Teams. These Care Teams include your primary Cardiologist (physician) and Advanced Practice Providers (APPs- Physician Assistants and Nurse Practitioners) who all work together to provide you with the care you need, when you need it.   You may see any of the following providers on your designated Care Team at your next follow up: Dr Toribio Fuel Dr Ezra Shuck Dr. Ria Commander Dr. Morene Brownie Amy Lenetta, NP Caffie Shed, [place] [facility] [place], [place] Beckey Coe, NP [place] Lee, NP Ellouise Class, NP Tinnie Redman, PharmD Jaun Bash, PharmD   Please be sure to bring in all your medications bottles to every appointment.    Thank you for choosing [facility]

## 2024-07-16 ENCOUNTER — Other Ambulatory Visit (HOSPITAL_COMMUNITY): Payer: Self-pay | Admitting: Cardiology

## 2024-07-23 ENCOUNTER — Other Ambulatory Visit (HOSPITAL_COMMUNITY): Payer: Self-pay

## 2024-07-23 DIAGNOSIS — I5022 Chronic systolic (congestive) heart failure: Secondary | ICD-10-CM

## 2024-07-26 ENCOUNTER — Telehealth (HOSPITAL_COMMUNITY): Payer: Self-pay

## 2024-07-26 DIAGNOSIS — I10 Essential (primary) hypertension: Secondary | ICD-10-CM | POA: Diagnosis not present

## 2024-07-26 DIAGNOSIS — G4733 Obstructive sleep apnea (adult) (pediatric): Secondary | ICD-10-CM | POA: Diagnosis not present

## 2024-07-26 DIAGNOSIS — I5020 Unspecified systolic (congestive) heart failure: Secondary | ICD-10-CM | POA: Diagnosis not present

## 2024-07-26 NOTE — Telephone Encounter (Signed)
 Reply faxed via epic

## 2024-07-26 NOTE — Telephone Encounter (Signed)
  ADVANCED HEART FAILURE CLINIC   Pre-operative Risk Assessment   HEARTCARE STAFF-IMPORTANT INSTRUCTIONS 1 Red and Blue Text will auto delete once note is signed or closed. 2 Press F2 to navigate through template.   3 On drop down lists, L click to select >> R click to activate next field 4 Reason for Visit format is IMPORTANT!!  See Directions on No. 2 below. 5 Please review chart to determine if there is already a clearance note open for this procedure!!  DO NOT duplicate if a note already exists!!    :1}      Request for Surgical Clearance    Procedure:  Right Total Knee Arthroplasty  Date of Surgery:  Clearance 08/20/24                                 Surgeon:  Toribio Higashi Surgeon's Group or Practice Name:  [facility] Phone number:  [phone] Fax number:  [phone]   Type of Clearance Requested:   - Medical    Type of Anesthesia:  Spinal   Additional requests/questions:  Please fax a copy of clearance to the surgeon's office.  Signed, Lisa CHRISTELLA Sergeant   07/26/2024, 10:12 AM   [facility] Harlene Gainer, FNP [facility] [address] Heart and Vascular [employer] [place] [address] [phone] (office) [phone] (fax)

## 2024-07-27 ENCOUNTER — Encounter (HOSPITAL_COMMUNITY)
Admission: RE | Admit: 2024-07-27 | Discharge: 2024-07-27 | Disposition: A | Discharge status: Home / Self Care | Source: Ambulatory Visit | Attending: Cardiology | Admitting: Cardiology

## 2024-07-27 DIAGNOSIS — I5022 Chronic systolic (congestive) heart failure: Secondary | ICD-10-CM | POA: Insufficient documentation

## 2024-07-27 NOTE — Progress Notes (Signed)
 Completed virtual orientation today.  EP evaluation is scheduled for 08/02/24 at 0800 .  Documentation for diagnosis can be found in [facility] encounter 07/23/24.

## 2024-08-02 ENCOUNTER — Encounter (HOSPITAL_COMMUNITY)
Admission: RE | Admit: 2024-08-02 | Discharge: 2024-08-02 | Disposition: A | Discharge status: Home / Self Care | Source: Ambulatory Visit | Attending: Cardiology | Admitting: Cardiology

## 2024-08-02 VITALS — Ht 71.0 in | Wt 223.5 lb

## 2024-08-02 DIAGNOSIS — I5022 Chronic systolic (congestive) heart failure: Secondary | ICD-10-CM

## 2024-08-02 NOTE — Progress Notes (Signed)
 Cardiac Individual Treatment Plan  Patient Details  Name: Jonathan Fowler MRN: [id number] Date of Birth: 1956-05-19 Referring Provider:   Flowsheet Row CARDIAC REHAB PHASE II ORIENTATION from 08/02/2024 in [facility] CARDIAC REHABILITATION  Referring Provider Ezra Shuck MD    Initial Encounter Date:  Flowsheet Row CARDIAC REHAB PHASE II ORIENTATION from 08/02/2024 in [facility] CARDIAC REHABILITATION  Date 08/02/24    Visit Diagnosis: Chronic systolic CHF (congestive heart failure) (HCC)  Patient's Home Medications on Admission:  Current Outpatient Medications:    aspirin  81 MG chewable tablet, Chew 1 tablet (81 mg total) by mouth daily., Disp: 90 tablet, Rfl: 3   atorvastatin  (LIPITOR) 10 MG tablet, Take 1 tablet (10 mg total) by mouth daily., Disp: 90 tablet, Rfl: 3   carvedilol  (COREG ) 3.125 MG tablet, Take 1 tablet (3.125 mg total) by mouth 2 (two) times daily with a meal., Disp: 180 tablet, Rfl: 3   Cholecalciferol (VITAMIN D3) 50 MCG (2000 UT) capsule, Take 2,000 Units by mouth daily. (Patient not taking: Reported on 07/14/2024), Disp: , Rfl:    empagliflozin  (JARDIANCE ) 10 MG TABS tablet, Take 1 tablet (10 mg total) by mouth daily., Disp: 30 tablet, Rfl: 11   glucosamine-chondroitin 500-400 MG tablet, Take 1 tablet by mouth in the morning and at bedtime., Disp: , Rfl:    metFORMIN (GLUCOPHAGE) 500 MG tablet, Take 500 mg by mouth in the morning and at bedtime., Disp: , Rfl:    methocarbamol (ROBAXIN) 500 MG tablet, Take 500 mg by mouth at bedtime as needed for muscle spasms (sleep)., Disp: , Rfl:    Multiple Vitamins-Minerals (CENTRUM SILVER 50+MEN) TABS, Take 1 tablet by mouth daily., Disp: , Rfl:    potassium chloride  SA (KLOR-CON  M) 20 MEQ tablet, Take 2 tablets (40 mEq total) by mouth daily., Disp: 180 tablet, Rfl: 3   sacubitril -valsartan  (ENTRESTO ) 97-103 MG, Take 1 tablet by mouth 2 (two) times daily., Disp: 60 tablet, Rfl: 11   spironolactone  (ALDACTONE ) 25 MG  tablet, Take 1 tablet (25 mg total) by mouth daily., Disp: 90 tablet, Rfl: 3   torsemide  (DEMADEX ) 20 MG tablet, Take 1 tablet (20 mg total) by mouth daily. May take an extra 20 mg if needed for edema, Disp: 90 tablet, Rfl: 3  Past Medical History: Past Medical History:  Diagnosis Date   High cholesterol    Hypertension    Vertigo     Tobacco Use: Social History   Tobacco Use  Smoking Status Never  Smokeless Tobacco Never    Labs: Review Flowsheet  More data may exist      Latest Ref Rng & Units 03/28/2013 06/30/2024 07/02/2024 07/05/2024 07/12/2024  Labs for ITP Cardiac and Pulmonary Rehab  Hemoglobin A1c - - 5.5  - - 6.0      Bicarbonate 20.0 - 28.0 mmol/L 20.0 - 28.0 mmol/L - - 28.5  28.9  28.3  27.6  -  TCO2 22 - 32 mmol/L 22 - 32 mmol/L 29  - 30  30  30  29   -  O2 Saturation % % - - 64  64  67  66  -    Details       This result is from an external source.   Multiple values from one day are sorted in reverse-chronological order          Exercise Target Goals: Exercise Program Goal: Individual exercise prescription set using results from initial 6 min walk test and THRR while considering  patient's activity  barriers and safety.   Exercise Prescription Goal: Initial exercise prescription builds to 30-45 minutes a day of aerobic activity, 2-3 days per week.  Home exercise guidelines will be given to patient during program as part of exercise prescription that the participant will acknowledge.   Education: Aerobic Exercise: - Group verbal and visual presentation on the components of exercise prescription. Introduces F.I.T.T principle from ACSM for exercise prescriptions.  Reviews F.I.T.T. principles of aerobic exercise including progression. Written material provided at class time.   Education: Resistance Exercise: - Group verbal and visual presentation on the components of exercise prescription. Introduces F.I.T.T principle from ACSM for exercise prescriptions   Reviews F.I.T.T. principles of resistance exercise including progression. Written material provided at class time.    Education: Exercise & Equipment Safety: - Individual verbal instruction and demonstration of equipment use and safety with use of the equipment.   Education: Exercise Physiology & General Exercise Guidelines: - Group verbal and written instruction with models to review the exercise physiology of the cardiovascular system and associated critical values. Provides general exercise guidelines with specific guidelines to those with heart or lung disease. Written material provided at class time.   Education: Flexibility, Balance, Mind/Body Relaxation: - Group verbal and visual presentation with interactive activity on the components of exercise prescription. Introduces F.I.T.T principle from ACSM for exercise prescriptions. Reviews F.I.T.T. principles of flexibility and balance exercise training including progression. Also discusses the mind body connection.  Reviews various relaxation techniques to help reduce and manage stress (i.e. Deep breathing, progressive muscle relaxation, and visualization). Balance handout provided to take home. Written material provided at class time.   Activity Barriers & Risk Stratification:  Activity Barriers & Cardiac Risk Stratification - 07/27/24 1304       Activity Barriers & Cardiac Risk Stratification   Activity Barriers Left Knee Replacement;Joint Problems;Deconditioning;Muscular Weakness    Cardiac Risk Stratification High   Supposed to have R knee replaced but it was cancelled, causes him pain.         6 Minute Walk:  6 Minute Walk     Row Name 08/02/24 0841         6 Minute Walk   Phase Initial     Distance 1280 feet     Walk Time 6 minutes     # of Rest Breaks 0     MPH 2.42     METS 2.75     RPE 11     VO2 Peak 9.6     Symptoms No     Resting HR 60 bpm     Resting BP 114/70     Resting Oxygen Saturation  97 %      Exercise Oxygen Saturation  during 6 min walk 97 %     Max Ex. HR 95 bpm     Max Ex. BP 130/60     2 Minute Post BP 118/64        Oxygen Initial Assessment:   Oxygen Re-Evaluation:   Oxygen Discharge (Final Oxygen Re-Evaluation):   Initial Exercise Prescription:  Initial Exercise Prescription - 08/02/24 0800       Date of Initial Exercise RX and Referring Provider   Date 08/02/24    Referring Provider Ezra Shuck MD      Treadmill   MPH 2    Grade 0.5    Minutes 15    METs 2.67      REL-XR   Level 2    Speed 50    Minutes  15    METs 2      Prescription Details   Frequency (times per week) 2    Duration Progress to 30 minutes of continuous aerobic without signs/symptoms of physical distress      Intensity   THRR 40-80% of Max Heartrate 97-134    Ratings of Perceived Exertion 11-13    Perceived Dyspnea 0-4      Resistance Training   Training Prescription Yes    Weight 4    Reps 10-15          Perform Capillary Blood Glucose checks as needed.  Exercise Prescription Changes:   Exercise Prescription Changes     Row Name 08/02/24 0800             Response to Exercise   Blood Pressure (Admit) 114/70       Blood Pressure (Exercise) 130/60       Blood Pressure (Exit) 118/64       Heart Rate (Admit) 60 bpm       Heart Rate (Exercise) 95 bpm       Heart Rate (Exit) 95 bpm       Oxygen Saturation (Admit) 97 %       Oxygen Saturation (Exercise) 97 %       Oxygen Saturation (Exit) 97 %       Rating of Perceived Exertion (Exercise) 11       Perceived Dyspnea (Exercise) 0          Exercise Comments:   Exercise Comments     Row Name 07/27/24 1305 08/02/24 0826         Exercise Comments Benen states he doesn't do much exercise at home besides walking some. Patient attend orientation today.  Patient is attending Cardiac Rehabilitation Program.  Documentation for diagnosis can be found in [facility].  Reviewed medical chart, RPE/RPD, gym safety, and  program guidelines.  Patient was fitted to equipment they will be using during rehab.  Patient is scheduled to start exercise on 08/09/24.   Initial ITP created and sent for review and signature by Dr. Dorn Ross, Medical Director for Cardiac Rehabilitation Program.         Exercise Goals and Review:   Exercise Goals     Row Name 07/27/24 1305             Exercise Goals   Increase Physical Activity Yes       Intervention Provide advice, education, support and counseling about physical activity/exercise needs.;Develop an individualized exercise prescription for aerobic and resistive training based on initial evaluation findings, risk stratification, comorbidities and participant's personal goals.       Expected Outcomes Short Term: Attend rehab on a regular basis to increase amount of physical activity.;Long Term: Add in home exercise to make exercise part of routine and to increase amount of physical activity.;Long Term: Exercising regularly at least 3-5 days a week.       Increase Strength and Stamina Yes       Intervention Provide advice, education, support and counseling about physical activity/exercise needs.;Develop an individualized exercise prescription for aerobic and resistive training based on initial evaluation findings, risk stratification, comorbidities and participant's personal goals.       Expected Outcomes Short Term: Increase workloads from initial exercise prescription for resistance, speed, and METs.;Long Term: Improve cardiorespiratory fitness, muscular endurance and strength as measured by increased METs and functional capacity ( );Short Term: Perform resistance training exercises routinely during rehab and add in resistance training at  home       Able to understand and use rate of perceived exertion (RPE) scale Yes       Intervention Provide education and explanation on how to use RPE scale       Expected Outcomes Short Term: Able to use RPE daily in rehab to  express subjective intensity level;Long Term:  Able to use RPE to guide intensity level when exercising independently       Able to understand and use Dyspnea scale Yes       Intervention Provide education and explanation on how to use Dyspnea scale       Expected Outcomes Short Term: Able to use Dyspnea scale daily in rehab to express subjective sense of shortness of breath during exertion;Long Term: Able to use Dyspnea scale to guide intensity level when exercising independently       Knowledge and understanding of Target Heart Rate Range (THRR) Yes       Intervention Provide education and explanation of THRR including how the numbers were predicted and where they are located for reference       Expected Outcomes Short Term: Able to state/look up THRR;Short Term: Able to use daily as guideline for intensity in rehab;Long Term: Able to use THRR to govern intensity when exercising independently       Able to check pulse independently Yes       Intervention Provide education and demonstration on how to check pulse in carotid and radial arteries.;Review the importance of being able to check your own pulse for safety during independent exercise       Expected Outcomes Long Term: Able to check pulse independently and accurately;Short Term: Able to explain why pulse checking is important during independent exercise       Understanding of Exercise Prescription Yes       Intervention Provide education, explanation, and written materials on patient's individual exercise prescription       Expected Outcomes Short Term: Able to explain program exercise prescription;Long Term: Able to explain home exercise prescription to exercise independently          Exercise Goals Re-Evaluation :   Discharge Exercise Prescription (Final Exercise Prescription Changes):  Exercise Prescription Changes - 08/02/24 0800       Response to Exercise   Blood Pressure (Admit) 114/70    Blood Pressure (Exercise) 130/60     Blood Pressure (Exit) 118/64    Heart Rate (Admit) 60 bpm    Heart Rate (Exercise) 95 bpm    Heart Rate (Exit) 95 bpm    Oxygen Saturation (Admit) 97 %    Oxygen Saturation (Exercise) 97 %    Oxygen Saturation (Exit) 97 %    Rating of Perceived Exertion (Exercise) 11    Perceived Dyspnea (Exercise) 0          Nutrition:  Target Goals: Understanding of nutrition guidelines, daily intake of sodium 1500mg , cholesterol 200mg , calories 30% from fat and 7% or less from saturated fats, daily to have 5 or more servings of fruits and vegetables.  Education: Nutrition 1 -Group instruction provided by verbal, written material, interactive activities, discussions, models, and posters to present general guidelines for heart healthy nutrition including macronutrients, label reading, and promoting whole foods over processed counterparts. Education serves as Pensions consultant of discussion of heart healthy eating for all. Written material provided at class time.    Education: Nutrition 2 -Group instruction provided by verbal, written material, interactive activities, discussions, models, and posters to present general  guidelines for heart healthy nutrition including sodium, cholesterol, and saturated fat. Providing guidance of habit forming to improve blood pressure, cholesterol, and body weight. Written material provided at class time.     Biometrics:  Pre Biometrics - 08/02/24 0843       Pre Biometrics   Height 5' 11 (1.803 m)    Weight 101.4 kg    Waist Circumference 41 inches    Hip Circumference 44 inches    Waist to Hip Ratio 0.93 %    BMI (Calculated) 31.19    Grip Strength 35.1 kg    Single Leg Stand 30 seconds           Nutrition Therapy Plan and Nutrition Goals:  Nutrition Therapy & Goals - 07/27/24 1306       Intervention Plan   Intervention Nutrition handout(s) given to patient.;Prescribe, educate and counsel regarding individualized specific dietary modifications aiming  towards targeted core components such as weight, hypertension, lipid management, diabetes, heart failure and other comorbidities.    Expected Outcomes Long Term Goal: Adherence to prescribed nutrition plan.;Short Term Goal: A plan has been developed with personal nutrition goals set during dietitian appointment.;Short Term Goal: Understand basic principles of dietary content, such as calories, fat, sodium, cholesterol and nutrients.          Nutrition Assessments:  Nutrition Assessments - 08/02/24 0822       Rate Your Plate Scores   Pre Score 75         MEDIFICTS Score Key: >=70 Need to make dietary changes  40-70 Heart Healthy Diet <= 40 Therapeutic Level Cholesterol Diet  Flowsheet Row CARDIAC REHAB PHASE II ORIENTATION from 08/02/2024 in [facility] CARDIAC REHABILITATION  Picture Your Plate Total Score on Admission 75   Picture Your Plate Scores: <59 Unhealthy dietary pattern with much room for improvement. 41-50 Dietary pattern unlikely to meet recommendations for good health and room for improvement. 51-60 More healthful dietary pattern, with some room for improvement.  >60 Healthy dietary pattern, although there may be some specific behaviors that could be improved.    Nutrition Goals Re-Evaluation:   Nutrition Goals Discharge (Final Nutrition Goals Re-Evaluation):   Psychosocial: Target Goals: Acknowledge presence or absence of significant depression and/or stress, maximize coping skills, provide positive support system. Participant is able to verbalize types and ability to use techniques and skills needed for reducing stress and depression.   Education: Stress, Anxiety, and Depression - Group verbal and visual presentation to define topics covered.  Reviews how body is impacted by stress, anxiety, and depression.  Also discusses healthy ways to reduce stress and to treat/manage anxiety and depression. Written material provided at class time.   Education: Sleep  Hygiene -Provides group verbal and written instruction about how sleep can affect your health.  Define sleep hygiene, discuss sleep cycles and impact of sleep habits. Review good sleep hygiene tips.   Initial Review & Psychosocial Screening:  Initial Psych Review & Screening - 07/27/24 1306       Initial Review   Current issues with Current Stress Concerns    Source of Stress Concerns Chronic Illness    Comments Lincon has been a little stressed about his new onset heart failure diagnosis.      Family Dynamics   Good Support System? Yes    Comments His wife Darice is a goos support system for him.      Barriers   Psychosocial barriers to participate in program There are no identifiable barriers or psychosocial  needs.      Screening Interventions   Interventions Encouraged to exercise    Expected Outcomes Short Term goal: Utilizing psychosocial counselor, staff and physician to assist with identification of specific Stressors or current issues interfering with healing process. Setting desired goal for each stressor or current issue identified.;Long Term Goal: Stressors or current issues are controlled or eliminated.;Long Term goal: The participant improves quality of Life and PHQ9 Scores as seen by post scores and/or verbalization of changes;Short Term goal: Identification and review with participant of any Quality of Life or Depression concerns found by scoring the questionnaire.          Quality of Life Scores:   Quality of Life - 08/02/24 0849       Quality of Life   Select Quality of Life      Quality of Life Scores   Health/Function Pre 21.77 %    Socioeconomic Pre 27.81 %    Psych/Spiritual Pre 27.21 %    Family Pre 25.2 %    GLOBAL Pre 24.73 %         Scores of 19 and below usually indicate a poorer quality of life in these areas.  A difference of  2-3 points is a clinically meaningful difference.  A difference of 2-3 points in the total score of the Quality of Life  Index has been associated with significant improvement in overall quality of life, self-image, physical symptoms, and general health in studies assessing change in quality of life.  PHQ-9: Review Flowsheet       08/02/2024  Depression screen PHQ 2/9  Decreased Interest 1  Down, Depressed, Hopeless 0  PHQ - 2 Score 1  Altered sleeping 0  Tired, decreased energy 1  Change in appetite 0  Feeling bad or failure about yourself  0  Trouble concentrating 0  Moving slowly or fidgety/restless 0  Suicidal thoughts 0  PHQ-9 Score 2  Difficult doing work/chores Not difficult at all   Interpretation of Total Score  Total Score Depression Severity:  1-4 = Minimal depression, 5-9 = Mild depression, 10-14 = Moderate depression, 15-19 = Moderately severe depression, 20-27 = Severe depression   Psychosocial Evaluation and Intervention:  Psychosocial Evaluation - 07/27/24 1307       Psychosocial Evaluation & Interventions   Interventions Stress management education;Encouraged to exercise with the program and follow exercise prescription    Comments Lief is a 68 year old male who is coming into rehab with a new diagnosis of heart failure. His EF at rest is <20% which makes his cardiac risk stratification high. He currently is back to work at [employer]. He has his wife Darice as a supoort system. He is a little stressed about this new diagnosis. His mobility is ok, he does have issues with his right knee which was supposed to be replaced, but got cancelled. He has had a left knee replacement in July of 2025. He is a diabetic and takes Metformin daily, but doesn't check his sugar ever. His home exercise includes walking a little bit.    Expected Outcomes Short: Increase strength and stamina. Long: Help increase longevity of life.    Continue Psychosocial Services  Follow up required by staff          Psychosocial Re-Evaluation:   Psychosocial Discharge (Final Psychosocial  Re-Evaluation):   Vocational Rehabilitation: Provide vocational rehab assistance to qualifying candidates.   Vocational Rehab Evaluation & Intervention:  Vocational Rehab - 07/27/24 1306  Initial Vocational Rehab Evaluation & Intervention   Assessment shows need for Vocational Rehabilitation No          Education: Education Goals: Education classes will be provided on a variety of topics geared toward better understanding of heart health and risk factor modification. Participant will state understanding/return demonstration of topics presented as noted by education test scores.  Learning Barriers/Preferences:  Learning Barriers/Preferences - 07/27/24 1306       Learning Barriers/Preferences   Learning Barriers None    Learning Preferences None          General Cardiac Education Topics:  AED/CPR: - Group verbal and written instruction with the use of models to demonstrate the basic use of the AED with the basic ABC's of resuscitation.   Test and Procedures: - Group verbal and visual presentation and models provide information about basic cardiac anatomy and function. Reviews the testing methods done to diagnose heart disease and the outcomes of the test results. Describes the treatment choices: Medical Management, Angioplasty, or Coronary Bypass Surgery for treating various heart conditions including Myocardial Infarction, Angina, Valve Disease, and Cardiac Arrhythmias. Written material provided at class time.   Medication Safety: - Group verbal and visual instruction to review commonly prescribed medications for heart and lung disease. Reviews the medication, class of the drug, and side effects. Includes the steps to properly store meds and maintain the prescription regimen. Written material provided at class time.   Intimacy: - Group verbal instruction through game format to discuss how heart and lung disease can affect sexual intimacy. Written material provided  at class time.   Know Your Numbers and Heart Failure: - Group verbal and visual instruction to discuss disease risk factors for cardiac and pulmonary disease and treatment options.  Reviews associated critical values for Overweight/Obesity, Hypertension, Cholesterol, and Diabetes.  Discusses basics of heart failure: signs/symptoms and treatments.  Introduces Heart Failure Zone chart for action plan for heart failure. Written material provided at class time.   Infection Prevention: - Provides verbal and written material to individual with discussion of infection control including proper hand washing and proper equipment cleaning during exercise session.   Falls Prevention: - Provides verbal and written material to individual with discussion of falls prevention and safety.   Other: -Provides group and verbal instruction on various topics (see comments)   Knowledge Questionnaire Score:  Knowledge Questionnaire Score - 08/02/24 0825       Knowledge Questionnaire Score   Pre Score 23/26          Core Components/Risk Factors/Patient Goals at Admission:  Personal Goals and Risk Factors at Admission - 07/27/24 1306       Core Components/Risk Factors/Patient Goals on Admission   Diabetes Yes    Intervention Provide education about signs/symptoms and action to take for hypo/hyperglycemia.;Provide education about proper nutrition, including hydration, and aerobic/resistive exercise prescription along with prescribed medications to achieve blood glucose in normal ranges: Fasting glucose 65-99 mg/dL    Expected Outcomes Short Term: Participant verbalizes understanding of the signs/symptoms and immediate care of hyper/hypoglycemia, proper foot care and importance of medication, aerobic/resistive exercise and nutrition plan for blood glucose control.;Long Term: Attainment of HbA1C < 7%.    Heart Failure Yes    Intervention Provide a combined exercise and nutrition program that is supplemented  with education, support and counseling about heart failure. Directed toward relieving symptoms such as shortness of breath, decreased exercise tolerance, and extremity edema.    Expected Outcomes Improve functional capacity of life;Short  term: Attendance in program 2-3 days a week with increased exercise capacity. Reported lower sodium intake. Reported increased fruit and vegetable intake. Reports medication compliance.;Short term: Daily weights obtained and reported for increase. Utilizing diuretic protocols set by physician.;Long term: Adoption of self-care skills and reduction of barriers for early signs and symptoms recognition and intervention leading to self-care maintenance.    Hypertension Yes    Intervention Provide education on lifestyle modifcations including regular physical activity/exercise, weight management, moderate sodium restriction and increased consumption of fresh fruit, vegetables, and low fat dairy, alcohol moderation, and smoking cessation.;Monitor prescription use compliance.    Expected Outcomes Short Term: Continued assessment and intervention until BP is < 140/48mm HG in hypertensive participants. < 130/29mm HG in hypertensive participants with diabetes, heart failure or chronic kidney disease.;Long Term: Maintenance of blood pressure at goal levels.    Lipids Yes    Intervention Provide education and support for participant on nutrition & aerobic/resistive exercise along with prescribed medications to achieve LDL 70mg , HDL >40mg .    Expected Outcomes Short Term: Participant states understanding of desired cholesterol values and is compliant with medications prescribed. Participant is following exercise prescription and nutrition guidelines.;Long Term: Cholesterol controlled with medications as prescribed, with individualized exercise RX and with personalized nutrition plan. Value goals: LDL < 70mg , HDL > 40 mg.          Education:Diabetes - Individual verbal and written  instruction to review signs/symptoms of diabetes, desired ranges of glucose level fasting, after meals and with exercise. Acknowledge that pre and post exercise glucose checks will be done for 3 sessions at entry of program.   Core Components/Risk Factors/Patient Goals Review:    Core Components/Risk Factors/Patient Goals at Discharge (Final Review):    ITP Comments:  ITP Comments     Row Name 07/27/24 1314 08/02/24 0826         ITP Comments Completed virtual orientation today.  EP evaluation is scheduled for 08/02/24 at 0800 .  Documentation for diagnosis can be found in [facility] encounter 07/23/24. Patient attend orientation today.  Patient is attending Cardiac Rehabilitation Program.  Documentation for diagnosis can be found in [facility].  Reviewed medical chart, RPE/RPD, gym safety, and program guidelines.  Patient was fitted to equipment they will be using during rehab.  Patient is scheduled to start exercise on 08/09/24.   Initial ITP created and sent for review and signature by Dr. Dorn Ross, Medical Director for Cardiac Rehabilitation Program.         Comments: Initial ITP.

## 2024-08-02 NOTE — Progress Notes (Signed)
 Patient attend orientation today.  Patient is attending Cardiac Rehabilitation Program.  Documentation for diagnosis can be found in [facility].  Reviewed medical chart, RPE/RPD, gym safety, and program guidelines.  Patient was fitted to equipment they will be using during rehab.  Patient is scheduled to start exercise on 08/09/24.   Initial ITP created and sent for review and signature by Dr. Dorn Ross, Medical Director for Cardiac Rehabilitation Program.

## 2024-08-02 NOTE — Patient Instructions (Signed)
 Patient Instructions  Patient Details  Name: Jonathan Fowler MRN: [id number] Date of Birth: February 04, 1956 Referring Provider:  Rolan Ezra RAMAN, MD  Below are your personal goals for exercise, nutrition, and risk factors. Our goal is to help you stay on track towards obtaining and maintaining these goals. We will be discussing your progress on these goals with you throughout the program.  Initial Exercise Prescription:  Initial Exercise Prescription - 08/02/24 0800       Date of Initial Exercise RX and Referring Provider   Date 08/02/24    Referring Provider Ezra Rolan MD      Treadmill   MPH 2    Grade 0.5    Minutes 15    METs 2.67      REL-XR   Level 2    Speed 50    Minutes 15    METs 2      Prescription Details   Frequency (times per week) 2    Duration Progress to 30 minutes of continuous aerobic without signs/symptoms of physical distress      Intensity   THRR 40-80% of Max Heartrate 97-134    Ratings of Perceived Exertion 11-13    Perceived Dyspnea 0-4      Resistance Training   Training Prescription Yes    Weight 4    Reps 10-15          Exercise Goals: Frequency: Be able to perform aerobic exercise two to three times per week in program working toward 2-5 days per week of home exercise.  Intensity: Work with a perceived exertion of 11 (fairly light) - 15 (hard) while following your exercise prescription.  We will make changes to your prescription with you as you progress through the program.   Duration: Be able to do 30 to 45 minutes of continuous aerobic exercise in addition to a 5 minute warm-up and a 5 minute cool-down routine.   Nutrition Goals: Your personal nutrition goals will be established when you do your nutrition analysis with the dietician.  The following are general nutrition guidelines to follow: Cholesterol < 200mg /day Sodium < 1500mg /day Fiber: Men over 50 yrs - 30 grams per day  Personal Goals:  Personal Goals and Risk  Factors at Admission - 07/27/24 1306       Core Components/Risk Factors/Patient Goals on Admission   Diabetes Yes    Intervention Provide education about signs/symptoms and action to take for hypo/hyperglycemia.;Provide education about proper nutrition, including hydration, and aerobic/resistive exercise prescription along with prescribed medications to achieve blood glucose in normal ranges: Fasting glucose 65-99 mg/dL    Expected Outcomes Short Term: Participant verbalizes understanding of the signs/symptoms and immediate care of hyper/hypoglycemia, proper foot care and importance of medication, aerobic/resistive exercise and nutrition plan for blood glucose control.;Long Term: Attainment of HbA1C < 7%.    Heart Failure Yes    Intervention Provide a combined exercise and nutrition program that is supplemented with education, support and counseling about heart failure. Directed toward relieving symptoms such as shortness of breath, decreased exercise tolerance, and extremity edema.    Expected Outcomes Improve functional capacity of life;Short term: Attendance in program 2-3 days a week with increased exercise capacity. Reported lower sodium intake. Reported increased fruit and vegetable intake. Reports medication compliance.;Short term: Daily weights obtained and reported for increase. Utilizing diuretic protocols set by physician.;Long term: Adoption of self-care skills and reduction of barriers for early signs and symptoms recognition and intervention leading to self-care maintenance.  Hypertension Yes    Intervention Provide education on lifestyle modifcations including regular physical activity/exercise, weight management, moderate sodium restriction and increased consumption of fresh fruit, vegetables, and low fat dairy, alcohol moderation, and smoking cessation.;Monitor prescription use compliance.    Expected Outcomes Short Term: Continued assessment and intervention until BP is < 140/73mm HG  in hypertensive participants. < 130/84mm HG in hypertensive participants with diabetes, heart failure or chronic kidney disease.;Long Term: Maintenance of blood pressure at goal levels.    Lipids Yes    Intervention Provide education and support for participant on nutrition & aerobic/resistive exercise along with prescribed medications to achieve LDL 70mg , HDL >40mg .    Expected Outcomes Short Term: Participant states understanding of desired cholesterol values and is compliant with medications prescribed. Participant is following exercise prescription and nutrition guidelines.;Long Term: Cholesterol controlled with medications as prescribed, with individualized exercise RX and with personalized nutrition plan. Value goals: LDL < 70mg , HDL > 40 mg.          Exercise Goals and Review:  Exercise Goals     Row Name 07/27/24 1305             Exercise Goals   Increase Physical Activity Yes       Intervention Provide advice, education, support and counseling about physical activity/exercise needs.;Develop an individualized exercise prescription for aerobic and resistive training based on initial evaluation findings, risk stratification, comorbidities and participant's personal goals.       Expected Outcomes Short Term: Attend rehab on a regular basis to increase amount of physical activity.;Long Term: Add in home exercise to make exercise part of routine and to increase amount of physical activity.;Long Term: Exercising regularly at least 3-5 days a week.       Increase Strength and Stamina Yes       Intervention Provide advice, education, support and counseling about physical activity/exercise needs.;Develop an individualized exercise prescription for aerobic and resistive training based on initial evaluation findings, risk stratification, comorbidities and participant's personal goals.       Expected Outcomes Short Term: Increase workloads from initial exercise prescription for resistance,  speed, and METs.;Long Term: Improve cardiorespiratory fitness, muscular endurance and strength as measured by increased METs and functional capacity ( );Short Term: Perform resistance training exercises routinely during rehab and add in resistance training at home       Able to understand and use rate of perceived exertion (RPE) scale Yes       Intervention Provide education and explanation on how to use RPE scale       Expected Outcomes Short Term: Able to use RPE daily in rehab to express subjective intensity level;Long Term:  Able to use RPE to guide intensity level when exercising independently       Able to understand and use Dyspnea scale Yes       Intervention Provide education and explanation on how to use Dyspnea scale       Expected Outcomes Short Term: Able to use Dyspnea scale daily in rehab to express subjective sense of shortness of breath during exertion;Long Term: Able to use Dyspnea scale to guide intensity level when exercising independently       Knowledge and understanding of Target Heart Rate Range (THRR) Yes       Intervention Provide education and explanation of THRR including how the numbers were predicted and where they are located for reference       Expected Outcomes Short Term: Able to state/look up THRR;Short Term: Able  to use daily as guideline for intensity in rehab;Long Term: Able to use THRR to govern intensity when exercising independently       Able to check pulse independently Yes       Intervention Provide education and demonstration on how to check pulse in carotid and radial arteries.;Review the importance of being able to check your own pulse for safety during independent exercise       Expected Outcomes Long Term: Able to check pulse independently and accurately;Short Term: Able to explain why pulse checking is important during independent exercise       Understanding of Exercise Prescription Yes       Intervention Provide education, explanation, and written  materials on patient's individual exercise prescription       Expected Outcomes Short Term: Able to explain program exercise prescription;Long Term: Able to explain home exercise prescription to exercise independently          Copy of goals given to participant.

## 2024-08-06 NOTE — Progress Notes (Signed)
 ADVANCED HF CLINIC NOTE  Primary Care: Regino Slater, MD HF Cardiolgist: assign to Dr. Zenaida  HPI: Jonathan Fowler is a 68 y.o. male with HTN, HLD, DM II, osteoarthritis and newly diagnosed systolic heart failure.   Admitted 9/25 with acute systolic heart failure. Took BP meds at home PTA and reported full compliance with them. Echo showed EF <20%, LV with GHK, LV severely dilated, GIIDD, RV mod reduced, mod elevated PASP, LA/RA severely dilated, mild MR.  AHF consulted.  Underwent RHC 07/02/2024 with severely elevated biventricular filling pressures and reduced cardiac index, LHC deferred with elevated renal function. Diuresed well with IV lasix , renal function improved. cMRI with NiCM BiV HF, LVEF 15%.As renal function improved, underwent [facility] 07/05/24 with nonobs CAD, elevated filling pressures and preserved CO. Required further diuresis and GDMT titrated. He was discharged home, weight 213 lbs.  Today he returns for HF follow up. Overall feeling fine. He notices SOB with exertion. He has started cardiac rehab. No breathing issues with ADLs or walking on flat ground. 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 Denies palpitations, abnormal bleeding, CP, dizziness, edema, or PND/Orthopnea. Appetite ok. Weight at home 216-219 pounds. Taking all medications. BP 110-115/70-80. Wears CPAP at night. No ETOH, tobacco or drug use.  Labs reviewed from nephology visit 08/10/24; K 4.1 and SCr 1.70.  Cardiac Studies - R/LHC (07/05/24): non-obs CAD; RA 11, PA 67/31 (42), PCWP 21, CO/CI (Fick) 6.64/3.01, PVR 3.1 WU, PAPi 3.2 - RHC (07/02/24): RA 9, PA 58/29 (40), PCWP 27, CO/CI (Fick) 5.76/2.55, PVR 2.25-2.9 WU, PAPi 3.2 - CMRI (9/25): LVEF 16%, RVEF 29%, no evidence of infiltrative disease; NICM - Echo (9/25): EF<20%, G2DD, RV moderately reduced   Past Medical History:  Diagnosis Date   High cholesterol    Hypertension    Vertigo    Current Outpatient Medications  Medication Sig Dispense  Refill   aspirin  81 MG chewable tablet Chew 1 tablet (81 mg total) by mouth daily. 90 tablet 3   atorvastatin  (LIPITOR) 10 MG tablet Take 1 tablet (10 mg total) by mouth daily. 90 tablet 3   carvedilol  (COREG ) 3.125 MG tablet Take 1 tablet (3.125 mg total) by mouth 2 (two) times daily with a meal. 180 tablet 3   Cholecalciferol (VITAMIN D3) 50 MCG (2000 UT) capsule Take 2,000 Units by mouth daily.     empagliflozin  (JARDIANCE ) 10 MG TABS tablet Take 1 tablet (10 mg total) by mouth daily. 30 tablet 11   glucosamine-chondroitin 500-400 MG tablet Take 1 tablet by mouth in the morning and at bedtime.     metFORMIN (GLUCOPHAGE) 500 MG tablet Take 500 mg by mouth in the morning and at bedtime.     methocarbamol (ROBAXIN) 500 MG tablet Take 500 mg by mouth at bedtime as needed for muscle spasms (sleep).     Multiple Vitamins-Minerals (CENTRUM SILVER 50+MEN) TABS Take 1 tablet by mouth daily.     potassium chloride  SA (KLOR-CON  M) 20 MEQ tablet Take 2 tablets (40 mEq total) by mouth daily. 180 tablet 3   sacubitril -valsartan  (ENTRESTO ) 97-103 MG Take 1 tablet by mouth 2 (two) times daily. 60 tablet 11   spironolactone  (ALDACTONE ) 25 MG tablet Take 1 tablet (25 mg total) by mouth daily. 90 tablet 3   torsemide  (DEMADEX ) 20 MG tablet Take 1 tablet (20 mg total) by mouth daily. May take an extra 20 mg if needed for edema 90 tablet 3   No current facility-administered medications for this encounter.  Allergies  Allergen Reactions   Loratadine     Headache    Lisinopril Cough   Mobic [Meloxicam] Nausea And Vomiting   Penicillins Nausea Only and Rash    Has patient had a PCN reaction causing immediate rash, facial/tongue/throat swelling, SOB or lightheadedness with hypotension: No Has patient had a PCN reaction causing severe rash involving mucus membranes or skin necrosis: No Has patient had a PCN reaction that required hospitalization: No Has patient had a PCN reaction occurring within the last 10  years: No If all of the above answers are NO, then may proceed with Cephalosporin use.   Social History   Socioeconomic History   Marital status: Married    Spouse name: Not on file   Number of children: Not on file   Years of education: Not on file   Highest education level: Not on file  Occupational History   Not on file  Tobacco Use   Smoking status: Never   Smokeless tobacco: Never  Substance and Sexual Activity   Alcohol use: Yes    Comment: socially   Drug use: No   Sexual activity: Not on file  Other Topics Concern   Not on file  Social History Narrative   Not on file   Social Drivers of Health   Financial Resource Strain: Not on file  Food Insecurity: No Food Insecurity (06/30/2024)   Hunger Vital Sign    Worried About Running Out of Food in the Last Year: Never true    Ran Out of Food in the Last Year: Never true  Transportation Needs: No Transportation Needs (06/30/2024)   PRAPARE - Administrator, Civil Service (Medical): No    Lack of Transportation (Non-Medical): No  Physical Activity: Not on file  Stress: Not on file  Social Connections: Unknown (06/30/2024)   Social Connection and Isolation Panel    Frequency of Communication with Friends and Family: Never    Frequency of Social Gatherings with Friends and Family: Never    Attends Religious Services: Never    Database Administrator or Organizations: Patient declined    Attends Banker Meetings: Patient declined    Marital Status: Married  Catering Manager Violence: Not At Risk (06/30/2024)   Humiliation, Afraid, Rape, and Kick questionnaire    Fear of Current or Ex-Partner: No    Emotionally Abused: No    Physically Abused: No    Sexually Abused: No   No family history on file. No family history of heart disease, dad passed away from sepsis and mom passed away from brain tumor.  Wt Readings from Last 3 Encounters:  08/11/24 99.4 kg (219 lb 3.2 oz)  08/02/24 101.4 kg (223  lb 8.7 oz)  07/14/24 99.3 kg (219 lb)   BP 128/72   Pulse 60   Wt 99.4 kg (219 lb 3.2 oz)   SpO2 96%   BMI 30.57 kg/m   PHYSICAL EXAM: General:  NAD. No resp difficulty, walked into clinic HEENT: Normal Neck: Supple. No JVD. Cor: Regular rate & rhythm. No rubs, gallops or murmurs. Lungs: Clear Abdomen: Soft, nontender, nondistended.  Extremities: No cyanosis, clubbing, rash, edema Neuro: Alert & oriented x 3, moves all 4 extremities w/o difficulty. Affect pleasant.  ASSESSMENT & PLAN: Chronic systolic heart failure, NiCM - Echo 9/25: EF <20%, LV with GHK, LV severely dilated, GIIDD, RV mod reduced, mod elevated PASP, LA/RA severely dilated, mild MR. TSH ok. HIV NR.  - CMR on 07/01/24: with  severely dilated right ventricle with severely reduced function.  LVEF 15%. - RHC 07/02/24 with severely elevated biventricular filling pressures and reduced cardiac index.  Due to elevated serum creatinine, left heart cath initially deferred. - Repeat R/LHC 07/05/24: Non obstructive CAD. Filling pressures elevated. Preserved CO - NYHA I-II, euvolemic on exam - Continue torsemide  20 mg daily.  - Continue Entresto  97-103 bid. - Continue spironolactone  25 mg daily.  - Continue Coreg  3.125 mg bid.  - Continue Jardiance  10 mg daily. Denies GU symptoms. - Very wide QRS 206 msec but RBBB morphology.  Can discuss with EP benefit of CRT in this situation if EF remains low.  - Continue CR  - labs from 08/10/24 reviewed; K 4.1, SCr 1.70 - Repeat echo next visit.   HTN - BP well controlled - Continue current meds    HLD - LDL 6/25 46 - Continue atorvastatin  10   CKD IIIa - Followed by Dr Tobie.  - Baseline SCr 1.4-1.7 - Continue Jardiance     Follow up next month with Dr. Zenaida + echo  Harlene HERO [facility] FNP-BC 08/11/24

## 2024-08-09 ENCOUNTER — Encounter (HOSPITAL_COMMUNITY)
Admission: RE | Admit: 2024-08-09 | Discharge: 2024-08-09 | Disposition: A | Discharge status: Home / Self Care | Source: Ambulatory Visit | Attending: Cardiology | Admitting: Cardiology

## 2024-08-09 DIAGNOSIS — I5022 Chronic systolic (congestive) heart failure: Secondary | ICD-10-CM | POA: Diagnosis not present

## 2024-08-09 NOTE — Progress Notes (Signed)
 Daily Session Note  Patient Details  Name: Jonathan Fowler MRN: [id number] Date of Birth: 1956/02/21 Referring Provider:   Flowsheet Row CARDIAC REHAB PHASE II ORIENTATION from 08/02/2024 in [facility] CARDIAC REHABILITATION  Referring Provider Ezra Shuck MD    Encounter Date: 08/09/2024  Check In:  Session Check In - 08/09/24 0752       Check-In   Supervising physician immediately available to respond to emergencies See telemetry face sheet for immediately available MD    Location [facility]    Staff Present Powell Benders, BS, Exercise Physiologist;Brittany Jackquline, BSN, RN, Rosalba Gelineau, MA, RCEP, CCRP, CCET    Virtual Visit No    Medication changes reported     No    Fall or balance concerns reported    No    Tobacco Cessation No Change    Warm-up and Cool-down Performed on first and last piece of equipment    Resistance Training Performed Yes    VAD Patient? No    PAD/SET Patient? No      Pain Assessment   Currently in Pain? No/denies    Multiple Pain Sites No          Capillary Blood Glucose: No results found for this or any previous visit (from the past 24 hours).    Social History   Tobacco Use  Smoking Status Never  Smokeless Tobacco Never    Goals Met:  Independence with exercise equipment Exercise tolerated well No report of concerns or symptoms today Strength training completed today  Goals Unmet:  Not Applicable  Comments: First full day of exercise!  Patient was oriented to gym and equipment including functions, settings, policies, and procedures.  Patient's individual exercise prescription and treatment plan were reviewed.  All starting workloads were established based on the results of the 6 minute walk test done at initial orientation visit.  The plan for exercise progression was also introduced and progression will be customized based on patient's performance and goals.

## 2024-08-10 ENCOUNTER — Telehealth (HOSPITAL_COMMUNITY): Payer: Self-pay

## 2024-08-10 DIAGNOSIS — N2581 Secondary hyperparathyroidism of renal origin: Secondary | ICD-10-CM | POA: Diagnosis not present

## 2024-08-10 NOTE — Telephone Encounter (Signed)
 Called to confirm/remind patient of their appointment at the [facility] on 08/11/24.   Appointment:   [x] Confirmed  [] Left mess   [] No answer/No voice mail  [] VM Full/unable to leave message  [] Phone not in service  Patient reminded to bring all medications and/or complete list.  Confirmed patient has transportation. Gave directions, instructed to utilize valet parking.

## 2024-08-11 ENCOUNTER — Ambulatory Visit (HOSPITAL_COMMUNITY)
Admission: RE | Admit: 2024-08-11 | Discharge: 2024-08-11 | Disposition: A | Discharge status: Home / Self Care | Source: Ambulatory Visit | Attending: Family Medicine | Admitting: Family Medicine

## 2024-08-11 ENCOUNTER — Encounter (HOSPITAL_COMMUNITY): Payer: Self-pay

## 2024-08-11 ENCOUNTER — Encounter (HOSPITAL_COMMUNITY)

## 2024-08-11 VITALS — BP 128/72 | HR 60 | Wt 219.2 lb

## 2024-08-11 DIAGNOSIS — N1831 Chronic kidney disease, stage 3a: Secondary | ICD-10-CM | POA: Diagnosis not present

## 2024-08-11 DIAGNOSIS — I10 Essential (primary) hypertension: Secondary | ICD-10-CM

## 2024-08-11 DIAGNOSIS — E1122 Type 2 diabetes mellitus with diabetic chronic kidney disease: Secondary | ICD-10-CM | POA: Diagnosis not present

## 2024-08-11 DIAGNOSIS — E785 Hyperlipidemia, unspecified: Secondary | ICD-10-CM | POA: Insufficient documentation

## 2024-08-11 DIAGNOSIS — I2510 Atherosclerotic heart disease of native coronary artery without angina pectoris: Secondary | ICD-10-CM | POA: Diagnosis not present

## 2024-08-11 DIAGNOSIS — I428 Other cardiomyopathies: Secondary | ICD-10-CM | POA: Insufficient documentation

## 2024-08-11 DIAGNOSIS — Z7984 Long term (current) use of oral hypoglycemic drugs: Secondary | ICD-10-CM | POA: Diagnosis not present

## 2024-08-11 DIAGNOSIS — I130 Hypertensive heart and chronic kidney disease with heart failure and stage 1 through stage 4 chronic kidney disease, or unspecified chronic kidney disease: Secondary | ICD-10-CM | POA: Diagnosis not present

## 2024-08-11 DIAGNOSIS — I5022 Chronic systolic (congestive) heart failure: Secondary | ICD-10-CM | POA: Insufficient documentation

## 2024-08-11 DIAGNOSIS — Z79899 Other long term (current) drug therapy: Secondary | ICD-10-CM | POA: Diagnosis not present

## 2024-08-11 DIAGNOSIS — R0602 Shortness of breath: Secondary | ICD-10-CM | POA: Diagnosis not present

## 2024-08-11 NOTE — Patient Instructions (Signed)
 There has been no changes to your medications.   Keep Follow up as scheduled.  If you have any questions or concerns before your next appointment please send us  a message through [employer] or call our office at [phone].    TO LEAVE A MESSAGE FOR THE NURSE SELECT OPTION 2, PLEASE LEAVE A MESSAGE INCLUDING: YOUR NAME DATE OF BIRTH CALL BACK NUMBER REASON FOR CALL**this is important as we prioritize the call backs  YOU WILL RECEIVE A CALL BACK THE SAME DAY AS LONG AS YOU CALL BEFORE 4:00 PM  At the [facility], you and your health needs are our priority. As part of our continuing mission to provide you with exceptional heart care, we have created designated Provider Care Teams. These Care Teams include your primary Cardiologist (physician) and Advanced Practice Providers (APPs- Physician Assistants and Nurse Practitioners) who all work together to provide you with the care you need, when you need it.   You may see any of the following providers on your designated Care Team at your next follow up: Dr Toribio Fuel Dr Ezra Shuck Dr. Morene Brownie Greig Mosses, NP Caffie Shed, [place] [facility] [place], [place] Beckey Coe, NP Jordan Lee, NP Ellouise Class, NP Tinnie Redman, PharmD Jaun Bash, PharmD   Please be sure to bring in all your medications bottles to every appointment.    Thank you for choosing [facility]

## 2024-08-16 ENCOUNTER — Encounter (HOSPITAL_COMMUNITY)

## 2024-08-16 DIAGNOSIS — D631 Anemia in chronic kidney disease: Secondary | ICD-10-CM | POA: Diagnosis not present

## 2024-08-16 DIAGNOSIS — I129 Hypertensive chronic kidney disease with stage 1 through stage 4 chronic kidney disease, or unspecified chronic kidney disease: Secondary | ICD-10-CM | POA: Diagnosis not present

## 2024-08-16 DIAGNOSIS — N1832 Chronic kidney disease, stage 3b: Secondary | ICD-10-CM | POA: Diagnosis not present

## 2024-08-16 DIAGNOSIS — N2581 Secondary hyperparathyroidism of renal origin: Secondary | ICD-10-CM | POA: Diagnosis not present

## 2024-08-18 ENCOUNTER — Encounter (HOSPITAL_COMMUNITY): Payer: Self-pay

## 2024-08-18 ENCOUNTER — Encounter (HOSPITAL_COMMUNITY)
Admission: RE | Admit: 2024-08-18 | Discharge: 2024-08-18 | Disposition: A | Discharge status: Home / Self Care | Source: Ambulatory Visit | Attending: Cardiology | Admitting: Cardiology

## 2024-08-18 DIAGNOSIS — I5022 Chronic systolic (congestive) heart failure: Secondary | ICD-10-CM

## 2024-08-18 NOTE — Progress Notes (Signed)
 Daily Session Note  Patient Details  Name: Jonathan Fowler MRN: [id number] Date of Birth: 08-10-1956 Referring Provider:   Flowsheet Row CARDIAC REHAB PHASE II ORIENTATION from 08/02/2024 in [facility] CARDIAC REHABILITATION  Referring Provider Ezra Shuck MD    Encounter Date: 08/18/2024  Check In:  Session Check In - 08/18/24 0804       Check-In   Supervising physician immediately available to respond to emergencies See telemetry face sheet for immediately available MD    Location [facility]    Staff Present Adrien Louder, RN, BSN;Heather Con HECKLE, Exercise Physiologist;Meghanne Pletz Zina, RN    Virtual Visit No    Medication changes reported     No    Fall or balance concerns reported    No    Warm-up and Cool-down Performed on first and last piece of equipment    Resistance Training Performed Yes    VAD Patient? No    PAD/SET Patient? No      Pain Assessment   Currently in Pain? No/denies          Capillary Blood Glucose: No results found for this or any previous visit (from the past 24 hours).    Social History   Tobacco Use  Smoking Status Never  Smokeless Tobacco Never    Goals Met:  Independence with exercise equipment Exercise tolerated well No report of concerns or symptoms today Strength training completed today  Goals Unmet:  Not Applicable  Comments: Pt able to follow exercise prescription today without complaint.  Will continue to monitor for progression.

## 2024-08-18 NOTE — Progress Notes (Signed)
 Cardiac Individual Treatment Plan  Patient Details  Name: Jonathan Fowler MRN: [id number] Date of Birth: [birth year] Referring Provider:   Flowsheet Row CARDIAC REHAB PHASE II ORIENTATION from 08/02/2024 in [facility] CARDIAC REHABILITATION  Referring Provider Ezra Shuck MD    Initial Encounter Date:  Flowsheet Row CARDIAC REHAB PHASE II ORIENTATION from 08/02/2024 in [facility] [place] CARDIAC REHABILITATION  Date 08/02/24    Visit Diagnosis: Chronic systolic CHF (congestive heart failure) (HCC)  Patient's Home Medications on Admission:  Current Outpatient Medications:    aspirin  81 MG chewable tablet, Chew 1 tablet (81 mg total) by mouth daily., Disp: 90 tablet, Rfl: 3   atorvastatin  (LIPITOR) 10 MG tablet, Take 1 tablet (10 mg total) by mouth daily., Disp: 90 tablet, Rfl: 3   carvedilol  (COREG ) 3.125 MG tablet, Take 1 tablet (3.125 mg total) by mouth 2 (two) times daily with a meal., Disp: 180 tablet, Rfl: 3   Cholecalciferol (VITAMIN D3) 50 MCG (2000 UT) capsule, Take 2,000 Units by mouth daily., Disp: , Rfl:    empagliflozin  (JARDIANCE ) 10 MG TABS tablet, Take 1 tablet (10 mg total) by mouth daily., Disp: 30 tablet, Rfl: 11   glucosamine-chondroitin 500-400 MG tablet, Take 1 tablet by mouth in the morning and at bedtime., Disp: , Rfl:    metFORMIN (GLUCOPHAGE) 500 MG tablet, Take 500 mg by mouth in the morning and at bedtime., Disp: , Rfl:    methocarbamol (ROBAXIN) 500 MG tablet, Take 500 mg by mouth at bedtime as needed for muscle spasms (sleep)., Disp: , Rfl:    Multiple Vitamins-Minerals (CENTRUM SILVER 50+MEN) TABS, Take 1 tablet by mouth daily., Disp: , Rfl:    potassium chloride  SA (KLOR-CON  M) 20 MEQ tablet, Take 2 tablets (40 mEq total) by mouth daily., Disp: 180 tablet, Rfl: 3   sacubitril -valsartan  (ENTRESTO ) 97-103 MG, Take 1 tablet by mouth 2 (two) times daily., Disp: 60 tablet, Rfl: 11   spironolactone  (ALDACTONE ) 25 MG tablet, Take 1 tablet (25 mg total) by mouth  daily., Disp: 90 tablet, Rfl: 3   torsemide  (DEMADEX ) 20 MG tablet, Take 1 tablet (20 mg total) by mouth daily. May take an extra 20 mg if needed for edema, Disp: 90 tablet, Rfl: 3  Past Medical History: Past Medical History:  Diagnosis Date   High cholesterol    Hypertension    Vertigo     Tobacco Use: Social History   Tobacco Use  Smoking Status Never  Smokeless Tobacco Never    Labs: Review Flowsheet  More data may exist      Latest Ref Rng & Units 03/28/2013 06/30/2024 07/02/2024 07/05/2024 07/12/2024  Labs for ITP Cardiac and Pulmonary Rehab  Hemoglobin A1c - - 5.5  - - 6.0      Bicarbonate 20.0 - 28.0 mmol/L 20.0 - 28.0 mmol/L - - 28.5  28.9  28.3  27.6  -  TCO2 22 - 32 mmol/L 22 - 32 mmol/L 29  - 30  30  30  29   -  O2 Saturation % % - - 64  64  67  66  -    Details       This result is from an external source.   Multiple values from one day are sorted in reverse-chronological order          Exercise Target Goals: Exercise Program Goal: Individual exercise prescription set using results from initial 6 min walk test and THRR while considering  patient's activity barriers and safety.   Exercise  Prescription Goal: Initial exercise prescription builds to 30-45 minutes a day of aerobic activity, 2-3 days per week.  Home exercise guidelines will be given to patient during program as part of exercise prescription that the participant will acknowledge.   Education: Aerobic Exercise: - Group verbal and visual presentation on the components of exercise prescription. Introduces F.I.T.T principle from ACSM for exercise prescriptions.  Reviews F.I.T.T. principles of aerobic exercise including progression. Written material provided at class time.   Education: Resistance Exercise: - Group verbal and visual presentation on the components of exercise prescription. Introduces F.I.T.T principle from ACSM for exercise prescriptions  Reviews F.I.T.T. principles of resistance  exercise including progression. Written material provided at class time.    Education: Exercise & Equipment Safety: - Individual verbal instruction and demonstration of equipment use and safety with use of the equipment.   Education: Exercise Physiology & General Exercise Guidelines: - Group verbal and written instruction with models to review the exercise physiology of the cardiovascular system and associated critical values. Provides general exercise guidelines with specific guidelines to those with heart or lung disease. Written material provided at class time.   Education: Flexibility, Balance, Mind/Body Relaxation: - Group verbal and visual presentation with interactive activity on the components of exercise prescription. Introduces F.I.T.T principle from ACSM for exercise prescriptions. Reviews F.I.T.T. principles of flexibility and balance exercise training including progression. Also discusses the mind body connection.  Reviews various relaxation techniques to help reduce and manage stress (i.e. Deep breathing, progressive muscle relaxation, and visualization). Balance handout provided to take home. Written material provided at class time.   Activity Barriers & Risk Stratification:  Activity Barriers & Cardiac Risk Stratification - 07/27/24 1304       Activity Barriers & Cardiac Risk Stratification   Activity Barriers Left Knee Replacement;Joint Problems;Deconditioning;Muscular Weakness    Cardiac Risk Stratification High   Supposed to have R knee replaced but it was cancelled, causes him pain.         6 Minute Walk:  6 Minute Walk     Row Name 08/02/24 0841         6 Minute Walk   Phase Initial     Distance 1280 feet     Walk Time 6 minutes     # of Rest Breaks 0     MPH 2.42     METS 2.75     RPE 11     VO2 Peak 9.6     Symptoms No     Resting HR 60 bpm     Resting BP 114/70     Resting Oxygen Saturation  97 %     Exercise Oxygen Saturation  during 6 min walk  97 %     Max Ex. HR 95 bpm     Max Ex. BP 130/60     2 Minute Post BP 118/64        Oxygen Initial Assessment:   Oxygen Re-Evaluation:   Oxygen Discharge (Final Oxygen Re-Evaluation):   Initial Exercise Prescription:  Initial Exercise Prescription - 08/02/24 0800       Date of Initial Exercise RX and Referring Provider   Date 08/02/24    Referring Provider Ezra Shuck MD      Treadmill   MPH 2    Grade 0.5    Minutes 15    METs 2.67      REL-XR   Level 2    Speed 50    Minutes 15    METs 2  Prescription Details   Frequency (times per week) 2    Duration Progress to 30 minutes of continuous aerobic without signs/symptoms of physical distress      Intensity   THRR 40-80% of Max Heartrate 97-134    Ratings of Perceived Exertion 11-13    Perceived Dyspnea 0-4      Resistance Training   Training Prescription Yes    Weight 4    Reps 10-15          Perform Capillary Blood Glucose checks as needed.  Exercise Prescription Changes:   Exercise Prescription Changes     Row Name 08/02/24 0800             Response to Exercise   Blood Pressure (Admit) 114/70       Blood Pressure (Exercise) 130/60       Blood Pressure (Exit) 118/64       Heart Rate (Admit) 60 bpm       Heart Rate (Exercise) 95 bpm       Heart Rate (Exit) 95 bpm       Oxygen Saturation (Admit) 97 %       Oxygen Saturation (Exercise) 97 %       Oxygen Saturation (Exit) 97 %       Rating of Perceived Exertion (Exercise) 11       Perceived Dyspnea (Exercise) 0          Exercise Comments:   Exercise Comments     Row Name 07/27/24 1305 08/02/24 0826 08/09/24 0752       Exercise Comments Jayin states he doesn't do much exercise at home besides walking some. Patient attend orientation today.  Patient is attending Cardiac Rehabilitation Program.  Documentation for diagnosis can be found in [facility].  Reviewed medical chart, RPE/RPD, gym safety, and program guidelines.  Patient was  fitted to equipment they will be using during rehab.  Patient is scheduled to start exercise on 08/09/24.   Initial ITP created and sent for review and signature by Dr. Dorn Ross, Medical Director for Cardiac Rehabilitation Program. First full day of exercise!  Patient was oriented to gym and equipment including functions, settings, policies, and procedures.  Patient's individual exercise prescription and treatment plan were reviewed.  All starting workloads were established based on the results of the 6 minute walk test done at initial orientation visit.  The plan for exercise progression was also introduced and progression will be customized based on patient's performance and goals.        Exercise Goals and Review:   Exercise Goals     Row Name 07/27/24 1305             Exercise Goals   Increase Physical Activity Yes       Intervention Provide advice, education, support and counseling about physical activity/exercise needs.;Develop an individualized exercise prescription for aerobic and resistive training based on initial evaluation findings, risk stratification, comorbidities and participant's personal goals.       Expected Outcomes Short Term: Attend rehab on a regular basis to increase amount of physical activity.;Long Term: Add in home exercise to make exercise part of routine and to increase amount of physical activity.;Long Term: Exercising regularly at least 3-5 days a week.       Increase Strength and Stamina Yes       Intervention Provide advice, education, support and counseling about physical activity/exercise needs.;Develop an individualized exercise prescription for aerobic and resistive training based on initial evaluation findings, risk  stratification, comorbidities and participant's personal goals.       Expected Outcomes Short Term: Increase workloads from initial exercise prescription for resistance, speed, and METs.;Long Term: Improve cardiorespiratory fitness,  muscular endurance and strength as measured by increased METs and functional capacity ( );Short Term: Perform resistance training exercises routinely during rehab and add in resistance training at home       Able to understand and use rate of perceived exertion (RPE) scale Yes       Intervention Provide education and explanation on how to use RPE scale       Expected Outcomes Short Term: Able to use RPE daily in rehab to express subjective intensity level;Long Term:  Able to use RPE to guide intensity level when exercising independently       Able to understand and use Dyspnea scale Yes       Intervention Provide education and explanation on how to use Dyspnea scale       Expected Outcomes Short Term: Able to use Dyspnea scale daily in rehab to express subjective sense of shortness of breath during exertion;Long Term: Able to use Dyspnea scale to guide intensity level when exercising independently       Knowledge and understanding of Target Heart Rate Range (THRR) Yes       Intervention Provide education and explanation of THRR including how the numbers were predicted and where they are located for reference       Expected Outcomes Short Term: Able to state/look up THRR;Short Term: Able to use daily as guideline for intensity in rehab;Long Term: Able to use THRR to govern intensity when exercising independently       Able to check pulse independently Yes       Intervention Provide education and demonstration on how to check pulse in carotid and radial arteries.;Review the importance of being able to check your own pulse for safety during independent exercise       Expected Outcomes Long Term: Able to check pulse independently and accurately;Short Term: Able to explain why pulse checking is important during independent exercise       Understanding of Exercise Prescription Yes       Intervention Provide education, explanation, and written materials on patient's individual exercise prescription        Expected Outcomes Short Term: Able to explain program exercise prescription;Long Term: Able to explain home exercise prescription to exercise independently          Exercise Goals Re-Evaluation :  Exercise Goals Re-Evaluation     Row Name 08/09/24 0753             Exercise Goal Re-Evaluation   Exercise Goals Review Increase Physical Activity;Increase Strength and Stamina;Understanding of Exercise Prescription       Comments Reviewed RPE and dyspnea scale, THR and program prescription with pt today.  Pt voiced understanding and was given a copy of goals to take home.       Expected Outcomes Short: Use RPE daily to regulate intensity.  Long: Follow program prescription in THR.          Discharge Exercise Prescription (Final Exercise Prescription Changes):  Exercise Prescription Changes - 08/02/24 0800       Response to Exercise   Blood Pressure (Admit) 114/70    Blood Pressure (Exercise) 130/60    Blood Pressure (Exit) 118/64    Heart Rate (Admit) 60 bpm    Heart Rate (Exercise) 95 bpm    Heart Rate (Exit) 95 bpm  Oxygen Saturation (Admit) 97 %    Oxygen Saturation (Exercise) 97 %    Oxygen Saturation (Exit) 97 %    Rating of Perceived Exertion (Exercise) 11    Perceived Dyspnea (Exercise) 0          Nutrition:  Target Goals: Understanding of nutrition guidelines, daily intake of sodium 1500mg , cholesterol 200mg , calories 30% from fat and 7% or less from saturated fats, daily to have 5 or more servings of fruits and vegetables.  Education: Nutrition 1 -Group instruction provided by verbal, written material, interactive activities, discussions, models, and posters to present general guidelines for heart healthy nutrition including macronutrients, label reading, and promoting whole foods over processed counterparts. Education serves as pensions consultant of discussion of heart healthy eating for all. Written material provided at class time.    Education: Nutrition 2 -Group  instruction provided by verbal, written material, interactive activities, discussions, models, and posters to present general guidelines for heart healthy nutrition including sodium, cholesterol, and saturated fat. Providing guidance of habit forming to improve blood pressure, cholesterol, and body weight. Written material provided at class time.     Biometrics:  Pre Biometrics - 08/02/24 0843       Pre Biometrics   Height 5' 11 (1.803 m)    Weight 223 lb 8.7 oz (101.4 kg)    Waist Circumference 41 inches    Hip Circumference 44 inches    Waist to Hip Ratio 0.93 %    BMI (Calculated) 31.19    Grip Strength 35.1 kg    Single Leg Stand 30 seconds           Nutrition Therapy Plan and Nutrition Goals:  Nutrition Therapy & Goals - 07/27/24 1306       Intervention Plan   Intervention Nutrition handout(s) given to patient.;Prescribe, educate and counsel regarding individualized specific dietary modifications aiming towards targeted core components such as weight, hypertension, lipid management, diabetes, heart failure and other comorbidities.    Expected Outcomes Long Term Goal: Adherence to prescribed nutrition plan.;Short Term Goal: A plan has been developed with personal nutrition goals set during dietitian appointment.;Short Term Goal: Understand basic principles of dietary content, such as calories, fat, sodium, cholesterol and nutrients.          Nutrition Assessments:  Nutrition Assessments - 08/02/24 0822       Rate Your Plate Scores   Pre Score 75         MEDIFICTS Score Key: >=70 Need to make dietary changes  40-70 Heart Healthy Diet <= 40 Therapeutic Level Cholesterol Diet  Flowsheet Row CARDIAC REHAB PHASE II ORIENTATION from 08/02/2024 in [facility] CARDIAC REHABILITATION  Picture Your Plate Total Score on Admission 75   Picture Your Plate Scores: <59 Unhealthy dietary pattern with much room for improvement. 41-50 Dietary pattern unlikely to meet  recommendations for good health and room for improvement. 51-60 More healthful dietary pattern, with some room for improvement.  >60 Healthy dietary pattern, although there may be some specific behaviors that could be improved.    Nutrition Goals Re-Evaluation:   Nutrition Goals Discharge (Final Nutrition Goals Re-Evaluation):   Psychosocial: Target Goals: Acknowledge presence or absence of significant depression and/or stress, maximize coping skills, provide positive support system. Participant is able to verbalize types and ability to use techniques and skills needed for reducing stress and depression.   Education: Stress, Anxiety, and Depression - Group verbal and visual presentation to define topics covered.  Reviews how body is impacted by stress, anxiety,  and depression.  Also discusses healthy ways to reduce stress and to treat/manage anxiety and depression. Written material provided at class time.   Education: Sleep Hygiene -Provides group verbal and written instruction about how sleep can affect your health.  Define sleep hygiene, discuss sleep cycles and impact of sleep habits. Review good sleep hygiene tips.   Initial Review & Psychosocial Screening:  Initial Psych Review & Screening - 07/27/24 1306       Initial Review   Current issues with Current Stress Concerns    Source of Stress Concerns Chronic Illness    Comments Daryl has been a little stressed about his new onset heart failure diagnosis.      Family Dynamics   Good Support System? Yes    Comments His wife Darice is a goos support system for him.      Barriers   Psychosocial barriers to participate in program There are no identifiable barriers or psychosocial needs.      Screening Interventions   Interventions Encouraged to exercise    Expected Outcomes Short Term goal: Utilizing psychosocial counselor, staff and physician to assist with identification of specific Stressors or current issues interfering  with healing process. Setting desired goal for each stressor or current issue identified.;Long Term Goal: Stressors or current issues are controlled or eliminated.;Long Term goal: The participant improves quality of Life and PHQ9 Scores as seen by post scores and/or verbalization of changes;Short Term goal: Identification and review with participant of any Quality of Life or Depression concerns found by scoring the questionnaire.          Quality of Life Scores:   Quality of Life - 08/02/24 0849       Quality of Life   Select Quality of Life      Quality of Life Scores   Health/Function Pre 21.77 %    Socioeconomic Pre 27.81 %    Psych/Spiritual Pre 27.21 %    Family Pre 25.2 %    GLOBAL Pre 24.73 %         Scores of 19 and below usually indicate a poorer quality of life in these areas.  A difference of  2-3 points is a clinically meaningful difference.  A difference of 2-3 points in the total score of the Quality of Life Index has been associated with significant improvement in overall quality of life, self-image, physical symptoms, and general health in studies assessing change in quality of life.  PHQ-9: Review Flowsheet       08/02/2024  Depression screen PHQ 2/9  Decreased Interest 1  Down, Depressed, Hopeless 0  PHQ - 2 Score 1  Altered sleeping 0  Tired, decreased energy 1  Change in appetite 0  Feeling bad or failure about yourself  0  Trouble concentrating 0  Moving slowly or fidgety/restless 0  Suicidal thoughts 0  PHQ-9 Score 2  Difficult doing work/chores Not difficult at all   Interpretation of Total Score  Total Score Depression Severity:  1-4 = Minimal depression, 5-9 = Mild depression, 10-14 = Moderate depression, 15-19 = Moderately severe depression, 20-27 = Severe depression   Psychosocial Evaluation and Intervention:  Psychosocial Evaluation - 07/27/24 1307       Psychosocial Evaluation & Interventions   Interventions Stress management  education;Encouraged to exercise with the program and follow exercise prescription    Comments Airik is a 68 year old male who is coming into rehab with a new diagnosis of heart failure. His EF at rest is <  20% which makes his cardiac risk stratification high. He currently is back to work at [employer]. He has his wife Darice as a supoort system. He is a little stressed about this new diagnosis. His mobility is ok, he does have issues with his right knee which was supposed to be replaced, but got cancelled. He has had a left knee replacement in July of 2025. He is a diabetic and takes Metformin daily, but doesn't check his sugar ever. His home exercise includes walking a little bit.    Expected Outcomes Short: Increase strength and stamina. Long: Help increase longevity of life.    Continue Psychosocial Services  Follow up required by staff          Psychosocial Re-Evaluation:   Psychosocial Discharge (Final Psychosocial Re-Evaluation):   Vocational Rehabilitation: Provide vocational rehab assistance to qualifying candidates.   Vocational Rehab Evaluation & Intervention:  Vocational Rehab - 07/27/24 1306       Initial Vocational Rehab Evaluation & Intervention   Assessment shows need for Vocational Rehabilitation No          Education: Education Goals: Education classes will be provided on a variety of topics geared toward better understanding of heart health and risk factor modification. Participant will state understanding/return demonstration of topics presented as noted by education test scores.  Learning Barriers/Preferences:  Learning Barriers/Preferences - 07/27/24 1306       Learning Barriers/Preferences   Learning Barriers None    Learning Preferences None          General Cardiac Education Topics:  AED/CPR: - Group verbal and written instruction with the use of models to demonstrate the basic use of the AED with the basic ABC's of resuscitation.   Test and  Procedures: - Group verbal and visual presentation and models provide information about basic cardiac anatomy and function. Reviews the testing methods done to diagnose heart disease and the outcomes of the test results. Describes the treatment choices: Medical Management, Angioplasty, or Coronary Bypass Surgery for treating various heart conditions including Myocardial Infarction, Angina, Valve Disease, and Cardiac Arrhythmias. Written material provided at class time.   Medication Safety: - Group verbal and visual instruction to review commonly prescribed medications for heart and lung disease. Reviews the medication, class of the drug, and side effects. Includes the steps to properly store meds and maintain the prescription regimen. Written material provided at class time.   Intimacy: - Group verbal instruction through game format to discuss how heart and lung disease can affect sexual intimacy. Written material provided at class time.   Know Your Numbers and Heart Failure: - Group verbal and visual instruction to discuss disease risk factors for cardiac and pulmonary disease and treatment options.  Reviews associated critical values for Overweight/Obesity, Hypertension, Cholesterol, and Diabetes.  Discusses basics of heart failure: signs/symptoms and treatments.  Introduces Heart Failure Zone chart for action plan for heart failure. Written material provided at class time.   Infection Prevention: - Provides verbal and written material to individual with discussion of infection control including proper hand washing and proper equipment cleaning during exercise session.   Falls Prevention: - Provides verbal and written material to individual with discussion of falls prevention and safety.   Other: -Provides group and verbal instruction on various topics (see comments)   Knowledge Questionnaire Score:  Knowledge Questionnaire Score - 08/02/24 0825       Knowledge Questionnaire Score    Pre Score 23/26          Core  Components/Risk Factors/Patient Goals at Admission:  Personal Goals and Risk Factors at Admission - 07/27/24 1306       Core Components/Risk Factors/Patient Goals on Admission   Diabetes Yes    Intervention Provide education about signs/symptoms and action to take for hypo/hyperglycemia.;Provide education about proper nutrition, including hydration, and aerobic/resistive exercise prescription along with prescribed medications to achieve blood glucose in normal ranges: Fasting glucose 65-99 mg/dL    Expected Outcomes Short Term: Participant verbalizes understanding of the signs/symptoms and immediate care of hyper/hypoglycemia, proper foot care and importance of medication, aerobic/resistive exercise and nutrition plan for blood glucose control.;Long Term: Attainment of HbA1C < 7%.    Heart Failure Yes    Intervention Provide a combined exercise and nutrition program that is supplemented with education, support and counseling about heart failure. Directed toward relieving symptoms such as shortness of breath, decreased exercise tolerance, and extremity edema.    Expected Outcomes Improve functional capacity of life;Short term: Attendance in program 2-3 days a week with increased exercise capacity. Reported lower sodium intake. Reported increased fruit and vegetable intake. Reports medication compliance.;Short term: Daily weights obtained and reported for increase. Utilizing diuretic protocols set by physician.;Long term: Adoption of self-care skills and reduction of barriers for early signs and symptoms recognition and intervention leading to self-care maintenance.    Hypertension Yes    Intervention Provide education on lifestyle modifcations including regular physical activity/exercise, weight management, moderate sodium restriction and increased consumption of fresh fruit, vegetables, and low fat dairy, alcohol moderation, and smoking cessation.;Monitor  prescription use compliance.    Expected Outcomes Short Term: Continued assessment and intervention until BP is < 140/66mm HG in hypertensive participants. < 130/54mm HG in hypertensive participants with diabetes, heart failure or chronic kidney disease.;Long Term: Maintenance of blood pressure at goal levels.    Lipids Yes    Intervention Provide education and support for participant on nutrition & aerobic/resistive exercise along with prescribed medications to achieve LDL 70mg , HDL >40mg .    Expected Outcomes Short Term: Participant states understanding of desired cholesterol values and is compliant with medications prescribed. Participant is following exercise prescription and nutrition guidelines.;Long Term: Cholesterol controlled with medications as prescribed, with individualized exercise RX and with personalized nutrition plan. Value goals: LDL < 70mg , HDL > 40 mg.          Education:Diabetes - Individual verbal and written instruction to review signs/symptoms of diabetes, desired ranges of glucose level fasting, after meals and with exercise. Acknowledge that pre and post exercise glucose checks will be done for 3 sessions at entry of program.   Core Components/Risk Factors/Patient Goals Review:    Core Components/Risk Factors/Patient Goals at Discharge (Final Review):    ITP Comments:  ITP Comments     Row Name 07/27/24 1314 08/02/24 0826 08/09/24 0752       ITP Comments Completed virtual orientation today.  EP evaluation is scheduled for 08/02/24 at 0800 .  Documentation for diagnosis can be found in [facility] encounter 07/23/24. Patient attend orientation today.  Patient is attending Cardiac Rehabilitation Program.  Documentation for diagnosis can be found in [facility].  Reviewed medical chart, RPE/RPD, gym safety, and program guidelines.  Patient was fitted to equipment they will be using during rehab.  Patient is scheduled to start exercise on 08/09/24.   Initial ITP created and sent for  review and signature by Dr. Dorn Ross, Medical Director for Cardiac Rehabilitation Program. First full day of exercise!  Patient was oriented to gym and equipment including  functions, settings, policies, and procedures.  Patient's individual exercise prescription and treatment plan were reviewed.  All starting workloads were established based on the results of the 6 minute walk test done at initial orientation visit.  The plan for exercise progression was also introduced and progression will be customized based on patient's performance and goals.        Comments: 30 day review

## 2024-08-19 DIAGNOSIS — G4733 Obstructive sleep apnea (adult) (pediatric): Secondary | ICD-10-CM | POA: Diagnosis not present

## 2024-08-23 ENCOUNTER — Encounter (HOSPITAL_COMMUNITY)
Admission: RE | Admit: 2024-08-23 | Discharge: 2024-08-23 | Disposition: A | Discharge status: Home / Self Care | Source: Ambulatory Visit | Attending: Cardiology | Admitting: Cardiology

## 2024-08-23 DIAGNOSIS — I5022 Chronic systolic (congestive) heart failure: Secondary | ICD-10-CM

## 2024-08-23 NOTE — Progress Notes (Signed)
 Daily Session Note  Patient Details  Name: KEIMARI Gratz MRN: [id number] Date of Birth: [birth year] Referring Provider:   Flowsheet Row CARDIAC REHAB PHASE II ORIENTATION from 08/02/2024 in [facility] CARDIAC REHABILITATION  Referring Provider Ezra Shuck MD    Encounter Date: 08/23/2024  Check In:  Session Check In - 08/23/24 0757       Check-In   Supervising physician immediately available to respond to emergencies See telemetry face sheet for immediately available MD    Location [facility]    Staff Present Powell Benders, BS, Exercise Physiologist;Rasheem Figiel Vonzell, MA, RCEP, CCRP, CCET;Brittany Jackquline, BSN, RN, WTA-C    Virtual Visit No    Medication changes reported     No    Fall or balance concerns reported    No    Warm-up and Cool-down Performed on first and last piece of equipment    Resistance Training Performed Yes    VAD Patient? No    PAD/SET Patient? No      Pain Assessment   Currently in Pain? No/denies          Capillary Blood Glucose: No results found for this or any previous visit (from the past 24 hours).    Social History   Tobacco Use  Smoking Status Never  Smokeless Tobacco Never    Goals Met:  Proper associated with RPD/PD & O2 Sat Exercise tolerated well Queuing for purse lip breathing No report of concerns or symptoms today Strength training completed today  Goals Unmet:  Not Applicable  Comments: Pt able to follow exercise prescription today without complaint.  Will continue to monitor for progression.

## 2024-08-25 ENCOUNTER — Encounter (HOSPITAL_COMMUNITY)
Admission: RE | Admit: 2024-08-25 | Discharge: 2024-08-25 | Disposition: A | Discharge status: Home / Self Care | Source: Ambulatory Visit | Attending: Cardiology | Admitting: Cardiology

## 2024-08-25 DIAGNOSIS — I5022 Chronic systolic (congestive) heart failure: Secondary | ICD-10-CM

## 2024-08-25 NOTE — Progress Notes (Signed)
 Daily Session Note  Patient Details  Name: Jonathan Fowler MRN: [id number] Date of Birth: Feb 09, 1956 Referring Provider:   Flowsheet Row CARDIAC REHAB PHASE II ORIENTATION from 08/02/2024 in [facility] CARDIAC REHABILITATION  Referring Provider Ezra Shuck MD    Encounter Date: 08/25/2024  Check In:  Session Check In - 08/25/24 0753       Check-In   Supervising physician immediately available to respond to emergencies See telemetry face sheet for immediately available MD    Location [facility]    Staff Present Powell Benders, BS, Exercise Physiologist;Jessica Vonzell, MA, RCEP, CCRP, CCET;Braelyn Bordonaro Jackquline, BSN, RN, WTA-C    Virtual Visit No    Medication changes reported     No    Fall or balance concerns reported    No    Tobacco Cessation No Change    Warm-up and Cool-down Performed on first and last piece of equipment    Resistance Training Performed Yes    VAD Patient? No    PAD/SET Patient? No      Pain Assessment   Currently in Pain? No/denies          Capillary Blood Glucose: No results found for this or any previous visit (from the past 24 hours).    Social History   Tobacco Use  Smoking Status Never  Smokeless Tobacco Never    Goals Met:  Independence with exercise equipment Exercise tolerated well No report of concerns or symptoms today Strength training completed today  Goals Unmet:  Not Applicable  Comments: Pt able to follow exercise prescription today without complaint.  Will continue to monitor for progression.

## 2024-08-30 ENCOUNTER — Emergency Department (HOSPITAL_COMMUNITY): Admission: EM | Admit: 2024-08-30 | Discharge: 2024-08-31 | Disposition: A

## 2024-08-30 ENCOUNTER — Encounter (HOSPITAL_COMMUNITY)

## 2024-08-30 ENCOUNTER — Other Ambulatory Visit: Payer: Self-pay

## 2024-08-30 ENCOUNTER — Emergency Department (HOSPITAL_COMMUNITY)

## 2024-08-30 DIAGNOSIS — N179 Acute kidney failure, unspecified: Secondary | ICD-10-CM | POA: Diagnosis not present

## 2024-08-30 DIAGNOSIS — R1013 Epigastric pain: Secondary | ICD-10-CM | POA: Insufficient documentation

## 2024-08-30 DIAGNOSIS — R109 Unspecified abdominal pain: Secondary | ICD-10-CM

## 2024-08-30 DIAGNOSIS — Z7982 Long term (current) use of aspirin: Secondary | ICD-10-CM | POA: Insufficient documentation

## 2024-08-30 DIAGNOSIS — R0602 Shortness of breath: Secondary | ICD-10-CM | POA: Diagnosis not present

## 2024-08-30 DIAGNOSIS — Z96611 Presence of right artificial shoulder joint: Secondary | ICD-10-CM | POA: Diagnosis not present

## 2024-08-30 DIAGNOSIS — R600 Localized edema: Secondary | ICD-10-CM | POA: Insufficient documentation

## 2024-08-30 DIAGNOSIS — I509 Heart failure, unspecified: Secondary | ICD-10-CM | POA: Diagnosis not present

## 2024-08-30 LAB — CBC WITH DIFFERENTIAL/PLATELET
Abs Immature Granulocytes: 0.04 K/uL (ref 0.00–0.07)
Basophils Absolute: 0.1 K/uL (ref 0.0–0.1)
Basophils Relative: 1 %
Eosinophils Absolute: 0.1 K/uL (ref 0.0–0.5)
Eosinophils Relative: 1 %
HCT: 41.4 % (ref 39.0–52.0)
Hemoglobin: 13.6 g/dL (ref 13.0–17.0)
Immature Granulocytes: 1 %
Lymphocytes Relative: 15 %
Lymphs Abs: 1.1 K/uL (ref 0.7–4.0)
MCH: 30.4 pg (ref 26.0–34.0)
MCHC: 32.9 g/dL (ref 30.0–36.0)
MCV: 92.4 fL (ref 80.0–100.0)
Monocytes Absolute: 0.6 K/uL (ref 0.1–1.0)
Monocytes Relative: 8 %
Neutro Abs: 5.7 K/uL (ref 1.7–7.7)
Neutrophils Relative %: 74 %
Platelets: 201 K/uL (ref 150–400)
RBC: 4.48 MIL/uL (ref 4.22–5.81)
RDW: 17.2 % — ABNORMAL HIGH (ref 11.5–15.5)
WBC: 7.6 K/uL (ref 4.0–10.5)
nRBC: 0 % (ref 0.0–0.2)

## 2024-08-30 LAB — COMPREHENSIVE METABOLIC PANEL WITH GFR
ALT: 154 U/L — ABNORMAL HIGH (ref 0–44)
AST: 176 U/L — ABNORMAL HIGH (ref 15–41)
Albumin: 3.9 g/dL (ref 3.5–5.0)
Alkaline Phosphatase: 81 U/L (ref 38–126)
Anion gap: 13 (ref 5–15)
BUN: 45 mg/dL — ABNORMAL HIGH (ref 8–23)
CO2: 22 mmol/L (ref 22–32)
Calcium: 9.2 mg/dL (ref 8.9–10.3)
Chloride: 104 mmol/L (ref 98–111)
Creatinine, Ser: 2.61 mg/dL — ABNORMAL HIGH (ref 0.61–1.24)
GFR, Estimated: 26 mL/min — ABNORMAL LOW (ref >60)
Glucose, Bld: 108 mg/dL — ABNORMAL HIGH (ref 70–99)
Potassium: 3.4 mmol/L — ABNORMAL LOW (ref 3.5–5.1)
Sodium: 139 mmol/L (ref 135–145)
Total Bilirubin: 2.9 mg/dL — ABNORMAL HIGH (ref 0.0–1.2)
Total Protein: 6.3 g/dL — ABNORMAL LOW (ref 6.5–8.1)

## 2024-08-30 LAB — RESP PANEL BY RT-PCR (RSV, FLU A&B, COVID)  RVPGX2
Influenza A by PCR: NEGATIVE
Influenza B by PCR: NEGATIVE
Resp Syncytial Virus by PCR: NEGATIVE
SARS Coronavirus 2 by RT PCR: NEGATIVE

## 2024-08-30 LAB — MAGNESIUM: Magnesium: 1.8 mg/dL (ref 1.7–2.4)

## 2024-08-30 LAB — BRAIN NATRIURETIC PEPTIDE: B Natriuretic Peptide: >4500 pg/mL — ABNORMAL HIGH (ref 0.0–100.0)

## 2024-08-30 MED ORDER — FUROSEMIDE 10 MG/ML IJ SOLN
60.0000 mg | Freq: Once | INTRAMUSCULAR | Status: AC
  Administered 2024-08-31: 60 mg via INTRAVENOUS
  Filled 2024-08-30: qty 6

## 2024-08-30 MED ORDER — POTASSIUM CHLORIDE CRYS ER 20 MEQ PO TBCR
40.0000 meq | EXTENDED_RELEASE_TABLET | Freq: Once | ORAL | Status: AC
  Administered 2024-08-31: 40 meq via ORAL
  Filled 2024-08-30: qty 2

## 2024-08-30 MED ORDER — ONDANSETRON HCL 4 MG/2ML IJ SOLN
4.0000 mg | Freq: Once | INTRAMUSCULAR | Status: AC
  Administered 2024-08-31: 4 mg via INTRAVENOUS
  Filled 2024-08-30: qty 2

## 2024-08-30 NOTE — ED Provider Triage Note (Signed)
 Emergency Medicine Provider Triage Evaluation Note  Jonathan Fowler , a 68 y.o. male  was evaluated in triage.  Pt complains of shortness of breath as well as nausea and vomiting that have been persistent over the last 3 days.  History of CHF, on last cardiology assessment in October 2025 it was noted patient had a EF less than 20.SABRA  Review of Systems  Positive: As above Negative:   Physical Exam  BP (!) 173/122 (BP Location: Right Arm)   Pulse 86   Temp 97.7 F (36.5 C)   Resp 19   Ht 5' 11 (1.803 m)   Wt 100.2 kg   SpO2 100%   BMI 30.82 kg/m  Gen:   Awake, no distress   Resp:  Normal effort  MSK:   Moves extremities without difficulty  Other:    Medical Decision Making  Medically screening exam initiated at 7:20 PM.  Appropriate orders placed.  Jonathan Fowler was informed that the remainder of the evaluation will be completed by another provider, this initial triage assessment does not replace that evaluation, and the importance of remaining in the ED until their evaluation is complete.  Initial labs and imaging ordered.   Jonathan Fowler, Jonathan 08/30/24 Fowler

## 2024-08-30 NOTE — ED Triage Notes (Signed)
 Patient reports shob, nausea, and vomiting.  He states that he has been having these symptoms on and off for a few days but they got worse today. Denies abdominal pain, chest pain, fevers. He feels like he is dehydrated.

## 2024-08-30 NOTE — ED Provider Notes (Addendum)
 [place] EMERGENCY DEPARTMENT AT [facility] Provider Note   CSN: [id number] Arrival date & time: 08/30/24  1807     Patient presents with: No chief complaint on file.   Jonathan Fowler is a 68 y.o. male.   This is a 68 year old male presents for multiple complaints.  Complaining of some epigastric abdominal pain with nausea and vomiting x 3 days.  Also worsening shortness of breath/dyspnea on exertion over the same duration.  No prior surgeries.  Had a normal bowel movement this morning.  Not having chest pain.  Does have history of CHF and has been compliant with his medications.  Denies significant lower extremity edema although his wife states they are slightly worsened.  No orthopnea        Prior to Admission medications   Medication Sig Start Date End Date Taking? Authorizing Provider  aspirin  81 MG chewable tablet Chew 1 tablet (81 mg total) by mouth daily. 07/14/24   Hayes Beckey CROME, NP  atorvastatin  (LIPITOR) 10 MG tablet Take 1 tablet (10 mg total) by mouth daily. 07/14/24   Hayes Beckey CROME, NP  carvedilol  (COREG ) 3.125 MG tablet Take 1 tablet (3.125 mg total) by mouth 2 (two) times daily with a meal. 07/14/24   Hayes Beckey CROME, NP  Cholecalciferol (VITAMIN D3) 50 MCG (2000 UT) capsule Take 2,000 Units by mouth daily.    [provider]  empagliflozin  (JARDIANCE ) 10 MG TABS tablet Take 1 tablet (10 mg total) by mouth daily. 07/14/24   Hayes Beckey CROME, NP  glucosamine-chondroitin 500-400 MG tablet Take 1 tablet by mouth in the morning and at bedtime.    [provider]  metFORMIN (GLUCOPHAGE) 500 MG tablet Take 500 mg by mouth in the morning and at bedtime. 07/21/23   [provider]  methocarbamol (ROBAXIN) 500 MG tablet Take 500 mg by mouth at bedtime as needed for muscle spasms (sleep). 06/17/24   [provider]  Multiple Vitamins-Minerals (CENTRUM SILVER 50+MEN) TABS Take 1 tablet by mouth daily.    [provider]  potassium  chloride SA (KLOR-CON  M) 20 MEQ tablet Take 2 tablets (40 mEq total) by mouth daily. 07/14/24   Hayes Beckey CROME, NP  sacubitril -valsartan  (ENTRESTO ) 97-103 MG Take 1 tablet by mouth 2 (two) times daily. 07/14/24   Hayes Beckey CROME, NP  spironolactone  (ALDACTONE ) 25 MG tablet Take 1 tablet (25 mg total) by mouth daily. 07/14/24   Hayes Beckey CROME, NP  torsemide  (DEMADEX ) 20 MG tablet Take 1 tablet (20 mg total) by mouth daily. May take an extra 20 mg if needed for edema 07/14/24   Hayes Beckey CROME, NP    Allergies: Loratadine, Lisinopril, Mobic [meloxicam], and Penicillins    Review of Systems  Updated Vital Signs BP (!) 156/112   Pulse 77   Temp 98.1 F (36.7 C) (Oral)   Resp 20   Ht 5' 11 (1.803 m)   Wt 100.2 kg   SpO2 96%   BMI 30.82 kg/m   Physical Exam Vitals and nursing note reviewed.  Constitutional:      General: He is not in acute distress.    Appearance: He is not toxic-appearing.  HENT:     Nose: Nose normal.     Mouth/Throat:     Mouth: Mucous membranes are moist.  Cardiovascular:     Rate and Rhythm: Normal rate and regular rhythm.  Pulmonary:     Effort: Pulmonary effort is normal.     Breath sounds:  Normal breath sounds.  Abdominal:     General: Abdomen is flat. There is no distension.     Palpations: Abdomen is soft.     Tenderness: There is abdominal tenderness (minor epigastric and RUQ). There is no guarding or rebound.  Musculoskeletal:     Cervical back: Normal range of motion.     Right lower leg: Edema present.     Left lower leg: Edema present.  Skin:    General: Skin is warm.     Capillary Refill: Capillary refill takes less than 2 seconds.  Neurological:     Mental Status: He is oriented to person, place, and time.  Psychiatric:        Mood and Affect: Mood normal.        Behavior: Behavior normal.     (all labs ordered are listed, but only abnormal results are displayed) Labs Reviewed  BRAIN NATRIURETIC PEPTIDE - Abnormal; Notable for the following  components:      Result Value   B Natriuretic Peptide >4,500.0 (*)    All other components within normal limits  COMPREHENSIVE METABOLIC PANEL WITH GFR - Abnormal; Notable for the following components:   Potassium 3.4 (*)    Glucose, Bld 108 (*)    BUN 45 (*)    Creatinine, Ser 2.61 (*)    Total Protein 6.3 (*)    AST 176 (*)    ALT 154 (*)    Total Bilirubin 2.9 (*)    GFR, Estimated 26 (*)    All other components within normal limits  CBC WITH DIFFERENTIAL/PLATELET - Abnormal; Notable for the following components:   RDW 17.2 (*)    All other components within normal limits  RESP PANEL BY RT-PCR (RSV, FLU A&B, COVID)  RVPGX2  MAGNESIUM     EKG: EKG Interpretation Date/Time:  Monday August 30 2024 18:27:37 EST Ventricular Rate:  84 PR Interval:  266 QRS Duration:  174 QT Interval:  410 QTC Calculation: 484 R Axis:   -78  Text Interpretation: Sinus rhythm with 1st degree A-V block with occasional Premature ventricular complexes and Premature atrial complexes Possible Left atrial enlargement Left axis deviation Right bundle branch block Left ventricular hypertrophy with repolarization abnormality ( R in aVL , Romhilt-Estes ) Artifact Confirmed by Neysa Clap [id number]) on 08/30/2024 9:18:18 PM  Radiology: [facility] Chest 1 View Result Date: 08/30/2024 CLINICAL DATA:  Shortness of breath EXAM: CHEST  1 VIEW COMPARISON:  Chest x-ray 06/29/2024 FINDINGS: The heart size and mediastinal contours are within normal limits. Both lungs are clear. Right shoulder arthroplasty present. No acute fractures. IMPRESSION: No active disease. Electronically Signed   By: Greig Pique M.D.   On: 08/30/2024 20:56     Procedures   Medications Ordered in the ED  furosemide  (LASIX ) injection 60 mg (has no administration in time range)  potassium chloride  SA (KLOR-CON  M) CR tablet 40 mEq (has no administration in time range)  ondansetron  (ZOFRAN ) injection 4 mg (has no administration in time range)     Clinical Course as of 08/30/24 2357  Mon Aug 30, 2024  2119 Per cardiology note: Admitted 9/25 with acute systolic heart failure. Took BP meds at home PTA and reported full compliance with them. Echo showed EF <20%, LV with GHK, LV severely dilated, GIIDD, RV mod reduced, mod elevated PASP, LA/RA severely dilated, mild MR.  AHF consulted.  Underwent RHC 07/02/2024 with severely elevated biventricular filling pressures and reduced cardiac index, LHC deferred with elevated renal function. Diuresed well  with IV lasix , renal function improved. cMRI with NiCM BiV HF, LVEF 15%.As renal function improved, underwent [facility] 07/05/24 with nonobs CAD, elevated filling pressures and preserved CO. Required further diuresis and GDMT titrated. He was discharged home, weight 213 lbs. [TY]  2120 Creatinine(!): 2.61 1.49 one month ago.  [TY]    Clinical Course User Index [TY] Neysa Caron PARAS, DO                                 Medical Decision Making This is a 68 year old male presenting emergency department for epigastric abdominal pain, nausea vomiting.  He is afebrile nontachycardic is hypertensive on arrival, but improved without intervention.  Maintaining oxygen saturation on room air.  Does not appear to be in overt distress.  Does have some coarse breath sounds.  Chest x-ray read as radiologist is normal, on my independent review does look like it has some vascular congestion.  His BNP is significantly elevated at 4500.  Does have some seemingly worsening CHF complaints with dyspnea on exertion, lower extremity edema.  BUN/creatinine also elevated.  He presented primarily for his epigastric/right upper quadrant abdominal pain and not having leukocytosis to suggest infectious process, but does have an elevated AST ALT and a T. bili.  Ultrasound to evaluate for acute cholecystitis.  Lasix  ordered for diuresis.  Care signed out to overnight team; final dispo pending US .   Amount and/or Complexity of Data  Reviewed Independent Historian:     Details: Wife notes compliant with medications External Data Reviewed:     Details: Admitted for CHF in sept; per d/c summary at that time:   Acute on chronic systolic CHF -Echo with EF <20%, global hypokinesis, grade II DD, RV moderate reduction,  -RHC-9/19-RV 64/10, PA 58/29 mean 40 , PCWP mean 27 Cardiac output 5,76 L/min and index 2.5 L/min/m2(Fick) -repeat R/LHC: Non obstructive coronary artery disease.  RV 61/13, PA 67/31 mean 42, PCWP mean 21 , CI-3 -Cardiac MRI -Reduced LV systolic function 16%, global hypokinesis, RV with severe dilatation with EF 29%, non ischemic cardiomyopathy.  - Improved with further diuresis, now felt to be euvolemic, transition to oral torsemide  -Continue Entresto , Aldactone , Jardiance , Coreg  -Discharged home in a stable condition, follow-up with advanced heart failure clinic on 10/1 Labs:  Decision-making details documented in ED Course. Radiology: ordered and independent interpretation performed.    Details: See above ECG/medicine tests: independent interpretation performed.    Details: Similar to prior.  No ischemic changes  Risk Prescription drug management. Decision regarding hospitalization. Diagnosis or treatment significantly limited by social determinants of health.       Final diagnoses:  Abdominal pain, unspecified abdominal location  Shortness of breath    ED Discharge Orders     None          Neysa Caron PARAS, DO 08/30/24 2340    Neysa Caron PARAS, DO 08/30/24 2357

## 2024-08-31 ENCOUNTER — Encounter (HOSPITAL_COMMUNITY): Payer: Self-pay

## 2024-08-31 DIAGNOSIS — K838 Other specified diseases of biliary tract: Secondary | ICD-10-CM | POA: Diagnosis not present

## 2024-08-31 DIAGNOSIS — R1011 Right upper quadrant pain: Secondary | ICD-10-CM | POA: Diagnosis not present

## 2024-08-31 DIAGNOSIS — K8020 Calculus of gallbladder without cholecystitis without obstruction: Secondary | ICD-10-CM | POA: Diagnosis not present

## 2024-08-31 NOTE — Discharge Instructions (Addendum)
 As we discussed, you have multiple findings today. It does appear that you are in a heart failure exacerbation and you also have something called an acute kidney injury. This can happen from the extra fluid.  You are already responding well to the furosemide  administered here in the emergency room and I anticipate your kidney function should improve over the next 72 hours though as we discussed you will need to have rechecked in this timeline. Please take a total of 60 mg of Demadex  today and then return to your normal schedule. Please follow-up with your primary care provider within 48 hours and your other specialist as we discussed.  If you are unable to obtain these appointments or get these labs rechecked, return to the emergency department for ongoing care.

## 2024-08-31 NOTE — ED Provider Notes (Signed)
 Care of patient received from prior provider at 2:59 AM, please see their note for complete H/P and care plan.  Received handoff per ED course.  Clinical Course as of 08/31/24 0259  Mon Aug 30, 2024  2119 Per cardiology note: Admitted 9/25 with acute systolic heart failure. Took BP meds at home PTA and reported full compliance with them. Echo showed EF <20%, LV with GHK, LV severely dilated, GIIDD, RV mod reduced, mod elevated PASP, LA/RA severely dilated, mild MR.  AHF consulted.  Underwent RHC 07/02/2024 with severely elevated biventricular filling pressures and reduced cardiac index, LHC deferred with elevated renal function. Diuresed well with IV lasix , renal function improved. cMRI with NiCM BiV HF, LVEF 15%.As renal function improved, underwent [facility] 07/05/24 with nonobs CAD, elevated filling pressures and preserved CO. Required further diuresis and GDMT titrated. He was discharged home, weight 213 lbs. [TY]  2120 Creatinine(!): 2.61 1.49 one month ago.  [TY]  Tue Aug 31, 2024  0004 Stable HO TJY SOB and RUQ pain. Getting RUQ US . Planning for admission for CHF EF<20% and SOB. [CC]    Clinical Course User Index [CC] Jerral Meth, MD [TY] Neysa Caron PARAS, DO    Reassessment: I evaluated bedside.  Patient states he is having no abdominal pain nausea vomiting and he has comfortably been able to tolerate p.o. fluids. He does have an elevated BNP. AKI.  He does appear volume overloaded.  Discussed with patient that this is likely a result of systemic congestion and cardiorenal syndrome.  History of very similar presentations in the past. Patient would like to attempt outpatient management for his condition.  Right upper quadrant ultrasound benign with simple cholelithiasis likely etiology of mild hepatitis on his lab work and cholestasis.  Currently asymptomatic, last event of biliary colic has resolved clinically. He states that he has follow-up with a nephrologist in the next 7 days,  cardiologist in the next 3 days and has an echo scheduled for 48 hours from now.  He states he would very strongly like to be discharged outpatient management if it can safely be done. Ambulatory tolerating p.o. intake at this time.  Has been able to tolerate diuresis, ambulation and p.o. intake safely.  Will increase his Demadex  tomorrow, he is already diuresing aggressively right now.  He will have labs rechecked in 48 hours.  He understood that if diuresis is unsuccessful over the next 48 hours he will need to return or if he has any worsening shortness of breath or chest pain.  He expressed understanding discharged in stable condition.  Disposition:  Patient is requesting discharge at this time.  Given patient's understanding of risk of severe missed diagnosis based on limitations of today's evaluation and risk of interval worsening of disease including life or limb threatening pathology, will participate in shared medical decision making and patient directed discharge at this time.  Patient is welcome to return for further diagnostic evaluation/therapeutic management at any time.   Emergency Department Medication Summary:   Medications  furosemide  (LASIX ) injection 60 mg (60 mg Intravenous Given 08/31/24 0033)  potassium chloride  SA (KLOR-CON  M) CR tablet 40 mEq (40 mEq Oral Given 08/31/24 0032)  ondansetron  (ZOFRAN ) injection 4 mg (4 mg Intravenous Given 08/31/24 0033)            Jerral Meth, MD 08/31/24 0300

## 2024-09-01 ENCOUNTER — Encounter (HOSPITAL_COMMUNITY)

## 2024-09-01 DIAGNOSIS — R063 Periodic breathing: Secondary | ICD-10-CM | POA: Diagnosis not present

## 2024-09-01 DIAGNOSIS — R0902 Hypoxemia: Secondary | ICD-10-CM | POA: Diagnosis not present

## 2024-09-01 DIAGNOSIS — I5020 Unspecified systolic (congestive) heart failure: Secondary | ICD-10-CM | POA: Diagnosis not present

## 2024-09-01 DIAGNOSIS — I10 Essential (primary) hypertension: Secondary | ICD-10-CM | POA: Diagnosis not present

## 2024-09-06 ENCOUNTER — Ambulatory Visit (HOSPITAL_COMMUNITY): Payer: Self-pay | Admitting: Internal Medicine

## 2024-09-06 ENCOUNTER — Encounter (HOSPITAL_COMMUNITY)

## 2024-09-06 ENCOUNTER — Ambulatory Visit (HOSPITAL_COMMUNITY)
Admission: RE | Admit: 2024-09-06 | Discharge: 2024-09-06 | Disposition: A | Discharge status: Home / Self Care | Source: Ambulatory Visit | Attending: Cardiology | Admitting: Cardiology

## 2024-09-06 ENCOUNTER — Other Ambulatory Visit (HOSPITAL_COMMUNITY): Payer: Self-pay

## 2024-09-06 ENCOUNTER — Encounter (HOSPITAL_COMMUNITY): Payer: Self-pay | Admitting: Cardiology

## 2024-09-06 ENCOUNTER — Ambulatory Visit (HOSPITAL_COMMUNITY)
Admission: RE | Admit: 2024-09-06 | Discharge: 2024-09-06 | Disposition: A | Discharge status: Home / Self Care | Source: Ambulatory Visit | Attending: Internal Medicine | Admitting: Internal Medicine

## 2024-09-06 VITALS — BP 130/80 | HR 73 | Wt 223.2 lb

## 2024-09-06 DIAGNOSIS — I071 Rheumatic tricuspid insufficiency: Secondary | ICD-10-CM | POA: Diagnosis not present

## 2024-09-06 DIAGNOSIS — I110 Hypertensive heart disease with heart failure: Secondary | ICD-10-CM | POA: Insufficient documentation

## 2024-09-06 DIAGNOSIS — I5022 Chronic systolic (congestive) heart failure: Secondary | ICD-10-CM | POA: Insufficient documentation

## 2024-09-06 LAB — ECHOCARDIOGRAM COMPLETE
AR max vel: 3.21 cm2
AV Area VTI: 3.3 cm2
AV Area mean vel: 2.81 cm2
AV Mean grad: 3 mmHg
AV Peak grad: 4.8 mmHg
Ao pk vel: 1.09 m/s
Area-P 1/2: 4.26 cm2
Est EF: <20
S' Lateral: 6.4 cm

## 2024-09-06 MED ORDER — DIGOXIN 125 MCG PO TABS: 0.0625 mg | ORAL_TABLET | Freq: Every day | ORAL | 3 refills | Status: DC

## 2024-09-06 NOTE — Progress Notes (Signed)
 ADVANCED HEART FAILURE FOLLOW UP CLINIC NOTE  Referring Physician: Regino Slater, MD  Primary Care: Regino Slater, MD Primary Cardiologist:  HPI: Jonathan Fowler is a 68 y.o. male who presents for follow up of chronic systolic heart failure.      Admitted 9/25 with acute systolic heart failure. Took BP meds at home PTA and reported full compliance with them. Echo showed EF <20%, AHF consulted.  Underwent RHC 07/02/2024 with severely elevated biventricular filling pressures and reduced cardiac index, LHC deferred with elevated renal function. Diuresed well with IV lasix , renal function improved. cMRI with NiCM BiV HF, LVEF 15%.As renal function improved, underwent [facility] 07/05/24 with nonobs CAD, elevated filling pressures and preserved CO. Required further diuresis and GDMT titrated. He was discharged home, weight 213 lbs.      SUBJECTIVE:  Patient reports that he has not been doing well the past few weeks.  He reports that he has been sleeping extremely poorly.  He wakes up multiple times during the night having to sit up and is short of breath while lying flat.  States that this has been ongoing despite CPAP, working on getting it titrated.  Weight is up slightly as well.  Reviewed recent echocardiogram, his current clinical trajectory, and potential advanced therapies.  PMH, current medications, allergies, social history, and family history reviewed in epic.  PHYSICAL EXAM: Vitals:   09/06/24 0846  BP: 130/80  Pulse: 73  SpO2: 98%   GENERAL: Well nourished and in no apparent distress at rest.  PULM:  Normal work of breathing, clear to auscultation bilaterally. Respirations are unlabored.  CARDIAC:  JVP: Mildly elevated         Normal rate with regular rhythm.  No murmur.  1+ edema. Warm and well perfused extremities. ABDOMEN: Soft, non-tender, non-distended. NEUROLOGIC: Patient is oriented x3 with no focal or lateralizing neurologic deficits.    DATA REVIEW  ECG: Sinus  rhythm, first-degree AV block, right bundle branch block with QRS duration 174  ECHO: 06/2024: LVEF less than 20%, grade 2 diastolic dysfunction, RV moderately reduced 09/06/2024: Severe biventricular failure, LVEF less than 20%, severely reduced RV systolic function, no significant valvular disease  CATH: 07/05/2024: Nonobstructive CAD, RA 11, PA 67/31 (42), PCWP 21, compensated assumed Fick CO/CI  CMR: 06/2024: LVEF 16%, RVEF 29%, no evidence of infiltrative disease, nonischemic cardiomyopathy  ASSESSMENT & PLAN:  Chronic systolic heart failure: Severely reduced biventricular function, evidence of volume overload with weight gain and orthopnea, NYHA class III symptoms. - Concerned about clinical trajectory although thankfully is tolerating excellent doses of GDMT - Echocardiogram with severely reduced ejection fraction, but unfortunately not completed after 3 months of GDMT - Repeat limited echo and will discuss with EP, has very prolonged QRS with right bundle branch block pattern - Continue Entresto  97/103 mg twice daily, spironolactone  25 mg daily, carvedilol  3.125 mg twice daily, Jardiance  10 mg daily - Start low-dose digoxin  0.0625 mg daily - Dig level at next visit - Increase torsemide  to 40 mg twice daily for 7 days then back to 40 mg daily - Plan for cardiopulmonary exercise testing - Based on results we will need to consider advanced therapies versus potential CRT-D and watchful waiting  Hypertension: -Well-controlled on medical therapy  CKD IIIa - Followed by Dr Tobie.  - Baseline SCr 1.4-1.7 - Continue Jardiance     Follow up in 1-2 months  I spent 45 minutes caring for this patient today including face to face time, ordering and reviewing labs, reviewing  records from Dr. Gardenia, discussing advanced therapies, seeing the patient, documenting in the record, and arranging follow ups.   Morene Brownie, MD Advanced Heart Failure Mechanical Circulatory  Support 09/06/24

## 2024-09-06 NOTE — Patient Instructions (Addendum)
 START Digoxin  .625 mcg ( 1/2 Tab) daily.  CHANGE Torsemide  to 40 mg Twice daily for 7 days, then go back to 40 mg daily.  When you go to get your blood work,PLEASE HOLD YOUR DIGOXIN  TILL AFTER THIS APPOINTMENT.  Your physician has requested that you have an echocardiogram. Echocardiography is a painless test that uses sound waves to create images of your heart. It provides your doctor with information about the size and shape of your heart and how well your heart's chambers and valves are working. This procedure takes approximately one hour. There are no restrictions for this procedure. Please do NOT wear cologne, perfume, aftershave, or lotions (deodorant is allowed). Please arrive 15 minutes prior to your appointment time.  Please note: We ask at that you not bring children with you during ultrasound (echo/ vascular) testing. Due to room size and safety concerns, children are not allowed in the ultrasound rooms during exams. Our front office staff cannot provide observation of children in our lobby area while testing is being conducted. An adult accompanying a patient to their appointment will only be allowed in the ultrasound room at the discretion of the ultrasound technician under special circumstances. We apologize for any inconvenience.  You are scheduled for a Cardiopulmonary Exercise (CPX) Test as [facility] on: Date:   09/15/2024   Time: 1.30 PM    Expect to be in the lab for 2 hours. Please plan to arrive 30 minutes prior to your appointment. You may be asked to reschedule your test if you arrive 20 minutes or more after your scheduled appointment time.  Main Campus address: [address] [place], [place] [address] You may arrive to the Main Entrance A or Entrance C (free valet parking is available at both). -Main Entrance A (on [address]) :proceed to admitting for check in -Entrance C (on [address]): proceed to [employer] parking or under hospital deck parking using this  code _________  Check In: [facility] waiting room (1st floor)   General Instructions for the day of the test (Please follow all instructions from your physician): Refrain from ingesting a heavy meal, alcohol, or caffeine or using tobacco products within 2 hours of the test (DO NOT FAST for mare than 8 hours). You may have all other non-alcoholic, non -caffeinated beverage,a light snack (crackers,a piece of fruit, carrot sticks, toast bagel,etc) up to your appointment. Avoid significant exertion or exercise within 24 hours of your test. Be prepared to exercise and sweat. Your clothing should permit freedom of movement and include walking or running shoes. Women bring loose fitting short sleeved blouse.  This evaluation may be fatiguing and you may wish ti have someone accompany you to the assessment to drive you home afterward. Bring a list of your medications with you, including dosage and frequency you take the medications (  I.e.,once per day, twice per day, etc). Take all medications as prescribed, unless noted below or instructed to do so by your physician.  Please do not take the following medications prior to your CPX:  _________________________________________________  _________________________________________________  Brief description of the test: A brief lung test will be performed. This will involve you taking deep breaths and blowing hard and fast through your mouth. During these , a clip will be on your nose and you will be breathing through a breathing device.   For the exercise portion of the test you will be walking on a treadmill, or riding a stationary bike, to your  maximal effor or until symptoms such as chest pain, shortness of breath, leg pain or dizziness limit your exercise. You will be breathing in and out of a breathing device through your mouth (a clip will be on your nose again). Your heart rate, ECG, blood pressure, oxygen saturations, breathing rate and  depth, amount of oxygen you consume and amount of carbon dioxide you produce will be measured and monitored throughout the exercise test.  If you need to cancel or reschedule your appointment please call [phone] If you have further questions please call your physician or Damien Nunnery at [phone]  Your physician recommends that you schedule a follow-up appointment in: 6 weeks.  If you have any questions or concerns before your next appointment please send us  a message through [facility] or call our office at [phone].    TO LEAVE A MESSAGE FOR THE NURSE SELECT OPTION 2, PLEASE LEAVE A MESSAGE INCLUDING: YOUR NAME DATE OF BIRTH CALL BACK NUMBER REASON FOR CALL**this is important as we prioritize the call backs  YOU WILL RECEIVE A CALL BACK THE SAME DAY AS LONG AS YOU CALL BEFORE 4:00 PM  At the [facility], you and your health needs are our priority. As part of our continuing mission to provide you with exceptional heart care, we have created designated Provider Care Teams. These Care Teams include your primary Cardiologist (physician) and Advanced Practice Providers (APPs- Physician Assistants and Nurse Practitioners) who all work together to provide you with the care you need, when you need it.   You may see any of the following providers on your designated Care Team at your next follow up: Dr Toribio Fuel Dr Ezra Shuck Dr. Morene Brownie Greig Mosses, NP Caffie Shed, [place] [facility] [place], [place] Beckey Coe, NP Jordan Lee, NP Ellouise Class, NP Tinnie Redman, PharmD Jaun Bash, PharmD   Please be sure to bring in all your medications bottles to every appointment.    Thank you for choosing [facility]

## 2024-09-07 DIAGNOSIS — I5022 Chronic systolic (congestive) heart failure: Secondary | ICD-10-CM | POA: Diagnosis not present

## 2024-09-07 DIAGNOSIS — N1832 Chronic kidney disease, stage 3b: Secondary | ICD-10-CM | POA: Diagnosis not present

## 2024-09-07 MED ORDER — TORSEMIDE 20 MG PO TABS: 40.0000 mg | ORAL_TABLET | Freq: Every day | ORAL | 3 refills | Status: DC

## 2024-09-08 ENCOUNTER — Encounter (HOSPITAL_COMMUNITY)

## 2024-09-08 LAB — DIGOXIN LEVEL: Digoxin, Serum: <0.4 ng/mL — ABNORMAL LOW (ref 0.5–0.9)

## 2024-09-13 ENCOUNTER — Encounter (HOSPITAL_COMMUNITY)

## 2024-09-13 DIAGNOSIS — N5201 Erectile dysfunction due to arterial insufficiency: Secondary | ICD-10-CM | POA: Diagnosis not present

## 2024-09-13 DIAGNOSIS — N400 Enlarged prostate without lower urinary tract symptoms: Secondary | ICD-10-CM | POA: Diagnosis not present

## 2024-09-13 DIAGNOSIS — C61 Malignant neoplasm of prostate: Secondary | ICD-10-CM | POA: Diagnosis not present

## 2024-09-13 DIAGNOSIS — I152 Hypertension secondary to endocrine disorders: Secondary | ICD-10-CM | POA: Diagnosis not present

## 2024-09-13 DIAGNOSIS — N1832 Chronic kidney disease, stage 3b: Secondary | ICD-10-CM | POA: Diagnosis not present

## 2024-09-13 DIAGNOSIS — N2581 Secondary hyperparathyroidism of renal origin: Secondary | ICD-10-CM | POA: Diagnosis not present

## 2024-09-14 ENCOUNTER — Encounter (HOSPITAL_COMMUNITY): Payer: Self-pay | Admitting: *Deleted

## 2024-09-14 DIAGNOSIS — I5022 Chronic systolic (congestive) heart failure: Secondary | ICD-10-CM

## 2024-09-14 NOTE — Progress Notes (Signed)
 Cardiac Individual Treatment Plan  Patient Details  Name: Jonathan Fowler MRN: [id number] Date of Birth: 1955-12-17 Referring Provider:   Flowsheet Row CARDIAC REHAB PHASE II ORIENTATION from 08/02/2024 in [facility] CARDIAC REHABILITATION  Referring Provider Jonathan Shuck MD    Initial Encounter Date:  Flowsheet Row CARDIAC REHAB PHASE II ORIENTATION from 08/02/2024 in [facility] CARDIAC REHABILITATION  Date 08/02/24    Visit Diagnosis: Chronic systolic CHF (congestive heart failure) (HCC)  Patient's Home Medications on Admission:  Current Outpatient Medications:    aspirin  81 MG chewable tablet, Chew 1 tablet (81 mg total) by mouth daily., Disp: 90 tablet, Rfl: 3   atorvastatin  (LIPITOR) 10 MG tablet, Take 1 tablet (10 mg total) by mouth daily., Disp: 90 tablet, Rfl: 3   carvedilol  (COREG ) 3.125 MG tablet, Take 1 tablet (3.125 mg total) by mouth 2 (two) times daily with a meal., Disp: 180 tablet, Rfl: 3   Cholecalciferol (VITAMIN D3) 50 MCG (2000 UT) capsule, Take 2,000 Units by mouth daily., Disp: , Rfl:    digoxin  (LANOXIN ) 0.125 MG tablet, Take 0.5 tablets (0.0625 mg total) by mouth daily., Disp: 45 tablet, Rfl: 3   empagliflozin  (JARDIANCE ) 10 MG TABS tablet, Take 1 tablet (10 mg total) by mouth daily., Disp: 30 tablet, Rfl: 11   glucosamine-chondroitin 500-400 MG tablet, Take 1 tablet by mouth in the morning and at bedtime., Disp: , Rfl:    metFORMIN (GLUCOPHAGE) 500 MG tablet, Take 500 mg by mouth in the morning and at bedtime., Disp: , Rfl:    methocarbamol (ROBAXIN) 500 MG tablet, Take 500 mg by mouth at bedtime as needed for muscle spasms (sleep)., Disp: , Rfl:    Multiple Vitamins-Minerals (CENTRUM SILVER 50+MEN) TABS, Take 1 tablet by mouth daily., Disp: , Rfl:    potassium chloride  SA (KLOR-CON  M) 20 MEQ tablet, Take 2 tablets (40 mEq total) by mouth daily., Disp: 180 tablet, Rfl: 3   sacubitril -valsartan  (ENTRESTO ) 97-103 MG, Take 1 tablet by mouth 2 (two) times  daily., Disp: 60 tablet, Rfl: 11   spironolactone  (ALDACTONE ) 25 MG tablet, Take 1 tablet (25 mg total) by mouth daily., Disp: 90 tablet, Rfl: 3   torsemide  (DEMADEX ) 20 MG tablet, Take 2 tablets (40 mg total) by mouth daily. TAKE 40MG  TWICE DAILY FOR 7 DAYS, THEN RETURN TO 40MG  ONCE DAILY, Disp: 270 tablet, Rfl: 3  Past Medical History: Past Medical History:  Diagnosis Date   High cholesterol    Hypertension    Vertigo     Tobacco Use: Social History   Tobacco Use  Smoking Status Never  Smokeless Tobacco Never    Labs: Review Flowsheet  More data may exist      Latest Ref Rng & Units 03/28/2013 06/30/2024 07/02/2024 07/05/2024 07/12/2024  Labs for ITP Cardiac and Pulmonary Rehab  Hemoglobin A1c - - 5.5  - - 6.0      Bicarbonate 20.0 - 28.0 mmol/L 20.0 - 28.0 mmol/L - - 28.5  28.9  28.3  27.6  -  TCO2 22 - 32 mmol/L 22 - 32 mmol/L 29  - 30  30  30  29   -  O2 Saturation % % - - 64  64  67  66  -    Details       This result is from an external source.   Multiple values from one day are sorted in reverse-chronological order         Capillary Blood Glucose: Lab Results  Component Value  Date   GLUCAP 106 (H) 07/03/2024   GLUCAP 120 (H) 07/02/2024   GLUCAP 105 (H) 07/02/2024   GLUCAP 147 (H) 07/02/2024   GLUCAP 92 07/02/2024     Exercise Target Goals: Exercise Program Goal: Individual exercise prescription set using results from initial 6 min walk test and THRR while considering  patient's activity barriers and safety.   Exercise Prescription Goal: Starting with aerobic activity 30 plus minutes a day, 3 days per week for initial exercise prescription. Provide home exercise prescription and guidelines that participant acknowledges understanding prior to discharge.  Activity Barriers & Risk Stratification:  Activity Barriers & Cardiac Risk Stratification - 07/27/24 1304       Activity Barriers & Cardiac Risk Stratification   Activity Barriers Left Knee  Replacement;Joint Problems;Deconditioning;Muscular Weakness    Cardiac Risk Stratification High   Supposed to have R knee replaced but it was cancelled, causes him pain.         6 Minute Walk:  6 Minute Walk     Row Name 08/02/24 0841         6 Minute Walk   Phase Initial     Distance 1280 feet     Walk Time 6 minutes     # of Rest Breaks 0     MPH 2.42     METS 2.75     RPE 11     VO2 Peak 9.6     Symptoms No     Resting HR 60 bpm     Resting BP 114/70     Resting Oxygen Saturation  97 %     Exercise Oxygen Saturation  during 6 min walk 97 %     Max Ex. HR 95 bpm     Max Ex. BP 130/60     2 Minute Post BP 118/64        Oxygen Initial Assessment:   Oxygen Re-Evaluation:   Oxygen Discharge (Final Oxygen Re-Evaluation):   Initial Exercise Prescription:  Initial Exercise Prescription - 08/02/24 0800       Date of Initial Exercise RX and Referring Provider   Date 08/02/24    Referring Provider Jonathan Shuck MD      Treadmill   MPH 2    Grade 0.5    Minutes 15    METs 2.67      REL-XR   Level 2    Speed 50    Minutes 15    METs 2      Prescription Details   Frequency (times per week) 2    Duration Progress to 30 minutes of continuous aerobic without signs/symptoms of physical distress      Intensity   THRR 40-80% of Max Heartrate 97-134    Ratings of Perceived Exertion 11-13    Perceived Dyspnea 0-4      Resistance Training   Training Prescription Yes    Weight 4    Reps 10-15          Perform Capillary Blood Glucose checks as needed.  Exercise Prescription Changes:   Exercise Prescription Changes     Row Name 08/02/24 0800 08/18/24 1200           Response to Exercise   Blood Pressure (Admit) 114/70 144/100      Blood Pressure (Exercise) 130/60 140/98      Blood Pressure (Exit) 118/64 140/80      Heart Rate (Admit) 60 bpm 70 bpm      Heart Rate (Exercise) 95  bpm 107 bpm      Heart Rate (Exit) 95 bpm 89 bpm      Oxygen  Saturation (Admit) 97 % --      Oxygen Saturation (Exercise) 97 % --      Oxygen Saturation (Exit) 97 % --      Rating of Perceived Exertion (Exercise) 11 13      Perceived Dyspnea (Exercise) 0 --      Duration -- Continue with 30 min of aerobic exercise without signs/symptoms of physical distress.      Intensity -- THRR unchanged        Progression   Progression -- Continue to progress workloads to maintain intensity without signs/symptoms of physical distress.        Resistance Training   Training Prescription -- Yes      Weight -- 4      Reps -- 10-15        Treadmill   MPH -- 2.2      Grade -- 0      Minutes -- 15      METs -- 2.69        REL-XR   Level -- 1      Speed -- 46      Minutes -- 15      METs -- 3.7         Exercise Comments:   Exercise Comments     Row Name 07/27/24 1305 08/02/24 0826 08/09/24 0752       Exercise Comments Jonathan Fowler states he doesn't do much exercise at home besides walking some. Patient attend orientation today.  Patient is attending Cardiac Rehabilitation Program.  Documentation for diagnosis can be found in [facility].  Reviewed medical chart, RPE/RPD, gym safety, and program guidelines.  Patient was fitted to equipment they will be using during rehab.  Patient is scheduled to start exercise on 08/09/24.   Initial ITP created and sent for review and signature by Dr. Dorn Ross, Medical Director for Cardiac Rehabilitation Program. First full day of exercise!  Patient was oriented to gym and equipment including functions, settings, policies, and procedures.  Patient's individual exercise prescription and treatment plan were reviewed.  All starting workloads were established based on the results of the 6 minute walk test done at initial orientation visit.  The plan for exercise progression was also introduced and progression will be customized based on patient's performance and goals.        Exercise Goals and Review:   Exercise Goals     Row  Name 07/27/24 1305             Exercise Goals   Increase Physical Activity Yes       Intervention Provide advice, education, support and counseling about physical activity/exercise needs.;Develop an individualized exercise prescription for aerobic and resistive training based on initial evaluation findings, risk stratification, comorbidities and participant's personal goals.       Expected Outcomes Short Term: Attend rehab on a regular basis to increase amount of physical activity.;Long Term: Add in home exercise to make exercise part of routine and to increase amount of physical activity.;Long Term: Exercising regularly at least 3-5 days a week.       Increase Strength and Stamina Yes       Intervention Provide advice, education, support and counseling about physical activity/exercise needs.;Develop an individualized exercise prescription for aerobic and resistive training based on initial evaluation findings, risk stratification, comorbidities and participant's personal goals.  Expected Outcomes Short Term: Increase workloads from initial exercise prescription for resistance, speed, and METs.;Long Term: Improve cardiorespiratory fitness, muscular endurance and strength as measured by increased METs and functional capacity ( );Short Term: Perform resistance training exercises routinely during rehab and add in resistance training at home       Able to understand and use rate of perceived exertion (RPE) scale Yes       Intervention Provide education and explanation on how to use RPE scale       Expected Outcomes Short Term: Able to use RPE daily in rehab to express subjective intensity level;Long Term:  Able to use RPE to guide intensity level when exercising independently       Able to understand and use Dyspnea scale Yes       Intervention Provide education and explanation on how to use Dyspnea scale       Expected Outcomes Short Term: Able to use Dyspnea scale daily in rehab to express  subjective sense of shortness of breath during exertion;Long Term: Able to use Dyspnea scale to guide intensity level when exercising independently       Knowledge and understanding of Target Heart Rate Range (THRR) Yes       Intervention Provide education and explanation of THRR including how the numbers were predicted and where they are located for reference       Expected Outcomes Short Term: Able to state/look up THRR;Short Term: Able to use daily as guideline for intensity in rehab;Long Term: Able to use THRR to govern intensity when exercising independently       Able to check pulse independently Yes       Intervention Provide education and demonstration on how to check pulse in carotid and radial arteries.;Review the importance of being able to check your own pulse for safety during independent exercise       Expected Outcomes Long Term: Able to check pulse independently and accurately;Short Term: Able to explain why pulse checking is important during independent exercise       Understanding of Exercise Prescription Yes       Intervention Provide education, explanation, and written materials on patient's individual exercise prescription       Expected Outcomes Short Term: Able to explain program exercise prescription;Long Term: Able to explain home exercise prescription to exercise independently          Exercise Goals Re-Evaluation :  Exercise Goals Re-Evaluation     Row Name 08/09/24 0753             Exercise Goal Re-Evaluation   Exercise Goals Review Increase Physical Activity;Increase Strength and Stamina;Understanding of Exercise Prescription       Comments Reviewed RPE and dyspnea scale, THR and program prescription with pt today.  Pt voiced understanding and was given a copy of goals to take home.       Expected Outcomes Short: Use RPE daily to regulate intensity.  Long: Follow program prescription in THR.           Discharge Exercise Prescription (Final Exercise  Prescription Changes):  Exercise Prescription Changes - 08/18/24 1200       Response to Exercise   Blood Pressure (Admit) 144/100    Blood Pressure (Exercise) 140/98    Blood Pressure (Exit) 140/80    Heart Rate (Admit) 70 bpm    Heart Rate (Exercise) 107 bpm    Heart Rate (Exit) 89 bpm    Rating of Perceived Exertion (Exercise) 13  Duration Continue with 30 min of aerobic exercise without signs/symptoms of physical distress.    Intensity THRR unchanged      Progression   Progression Continue to progress workloads to maintain intensity without signs/symptoms of physical distress.      Resistance Training   Training Prescription Yes    Weight 4    Reps 10-15      Treadmill   MPH 2.2    Grade 0    Minutes 15    METs 2.69      REL-XR   Level 1    Speed 46    Minutes 15    METs 3.7          Nutrition:  Target Goals: Understanding of nutrition guidelines, daily intake of sodium 1500mg , cholesterol 200mg , calories 30% from fat and 7% or less from saturated fats, daily to have 5 or more servings of fruits and vegetables.  Biometrics:  Pre Biometrics - 08/02/24 0843       Pre Biometrics   Height 5' 11 (1.803 m)    Weight 223 lb 8.7 oz (101.4 kg)    Waist Circumference 41 inches    Hip Circumference 44 inches    Waist to Hip Ratio 0.93 %    BMI (Calculated) 31.19    Grip Strength 35.1 kg    Single Leg Stand 30 seconds           Nutrition Therapy Plan and Nutrition Goals:  Nutrition Therapy & Goals - 07/27/24 1306       Intervention Plan   Intervention Nutrition handout(s) given to patient.;Prescribe, educate and counsel regarding individualized specific dietary modifications aiming towards targeted core components such as weight, hypertension, lipid management, diabetes, heart failure and other comorbidities.    Expected Outcomes Long Term Goal: Adherence to prescribed nutrition plan.;Short Term Goal: A plan has been developed with personal nutrition  goals set during dietitian appointment.;Short Term Goal: Understand basic principles of dietary content, such as calories, fat, sodium, cholesterol and nutrients.          Nutrition Assessments:  Nutrition Assessments - 08/02/24 0822       Rate Your Plate Scores   Pre Score 75         MEDIFICTS Score Key: >=70 Need to make dietary changes  40-70 Heart Healthy Diet <= 40 Therapeutic Level Cholesterol Diet  Flowsheet Row CARDIAC REHAB PHASE II ORIENTATION from 08/02/2024 in [facility] CARDIAC REHABILITATION  Picture Your Plate Total Score on Admission 75   Picture Your Plate Scores: <59 Unhealthy dietary pattern with much room for improvement. 41-50 Dietary pattern unlikely to meet recommendations for good health and room for improvement. 51-60 More healthful dietary pattern, with some room for improvement.  >60 Healthy dietary pattern, although there may be some specific behaviors that could be improved.    Nutrition Goals Re-Evaluation:   Nutrition Goals Discharge (Final Nutrition Goals Re-Evaluation):   Psychosocial: Target Goals: Acknowledge presence or absence of significant depression and/or stress, maximize coping skills, provide positive support system. Participant is able to verbalize types and ability to use techniques and skills needed for reducing stress and depression.  Initial Review & Psychosocial Screening:  Initial Psych Review & Screening - 07/27/24 1306       Initial Review   Current issues with Current Stress Concerns    Source of Stress Concerns Chronic Illness    Comments Jonathan Fowler has been a little stressed about his new onset heart failure diagnosis.  Family Dynamics   Good Support System? Yes    Comments His wife Darice is a goos support system for him.      Barriers   Psychosocial barriers to participate in program There are no identifiable barriers or psychosocial needs.      Screening Interventions   Interventions Encouraged to  exercise    Expected Outcomes Short Term goal: Utilizing psychosocial counselor, staff and physician to assist with identification of specific Stressors or current issues interfering with healing process. Setting desired goal for each stressor or current issue identified.;Long Term Goal: Stressors or current issues are controlled or eliminated.;Long Term goal: The participant improves quality of Life and PHQ9 Scores as seen by post scores and/or verbalization of changes;Short Term goal: Identification and review with participant of any Quality of Life or Depression concerns found by scoring the questionnaire.          Quality of Life Scores:  Quality of Life - 08/02/24 0849       Quality of Life   Select Quality of Life      Quality of Life Scores   Health/Function Pre 21.77 %    Socioeconomic Pre 27.81 %    Psych/Spiritual Pre 27.21 %    Family Pre 25.2 %    GLOBAL Pre 24.73 %         Scores of 19 and below usually indicate a poorer quality of life in these areas.  A difference of  2-3 points is a clinically meaningful difference.  A difference of 2-3 points in the total score of the Quality of Life Index has been associated with significant improvement in overall quality of life, self-image, physical symptoms, and general health in studies assessing change in quality of life.  PHQ-9: Review Flowsheet       08/02/2024  Depression screen PHQ 2/9  Decreased Interest 1  Down, Depressed, Hopeless 0  PHQ - 2 Score 1  Altered sleeping 0  Tired, decreased energy 1  Change in appetite 0  Feeling bad or failure about yourself  0  Trouble concentrating 0  Moving slowly or fidgety/restless 0  Suicidal thoughts 0  PHQ-9 Score 2   Difficult doing work/chores Not difficult at all    Details       Data saved with a previous flowsheet row definition        Interpretation of Total Score  Total Score Depression Severity:  1-4 = Minimal depression, 5-9 = Mild depression, 10-14 =  Moderate depression, 15-19 = Moderately severe depression, 20-27 = Severe depression   Psychosocial Evaluation and Intervention:  Psychosocial Evaluation - 07/27/24 1307       Psychosocial Evaluation & Interventions   Interventions Stress management education;Encouraged to exercise with the program and follow exercise prescription    Comments Jonathan Fowler is a 68 year old male who is coming into rehab with a new diagnosis of heart failure. His EF at rest is <20% which makes his cardiac risk stratification high. He currently is back to work at [employer]. He has his wife Darice as a supoort system. He is a little stressed about this new diagnosis. His mobility is ok, he does have issues with his right knee which was supposed to be replaced, but got cancelled. He has had a left knee replacement in July of 2025. He is a diabetic and takes Metformin daily, but doesn't check his sugar ever. His home exercise includes walking a little bit.    Expected Outcomes Short: Increase strength and  stamina. Long: Help increase longevity of life.    Continue Psychosocial Services  Follow up required by staff          Psychosocial Re-Evaluation:   Psychosocial Discharge (Final Psychosocial Re-Evaluation):   Vocational Rehabilitation: Provide vocational rehab assistance to qualifying candidates.   Vocational Rehab Evaluation & Intervention:  Vocational Rehab - 07/27/24 1306       Initial Vocational Rehab Evaluation & Intervention   Assessment shows need for Vocational Rehabilitation No          Education: Education Goals: Education classes will be provided on a weekly basis, covering required topics. Participant will state understanding/return demonstration of topics presented.  Learning Barriers/Preferences:  Learning Barriers/Preferences - 07/27/24 1306       Learning Barriers/Preferences   Learning Barriers None    Learning Preferences None          Education Topics: Hypertension,  Hypertension Reduction -Define heart disease and high blood pressure. Discus how high blood pressure affects the body and ways to reduce high blood pressure.   Exercise and Your Heart -Discuss why it is important to exercise, the FITT principles of exercise, normal and abnormal responses to exercise, and how to exercise safely.   Angina -Discuss definition of angina, causes of angina, treatment of angina, and how to decrease risk of having angina.   Cardiac Medications -Review what the following cardiac medications are used for, how they affect the body, and side effects that may occur when taking the medications.  Medications include Aspirin , Beta blockers, calcium  channel blockers, ACE Inhibitors, angiotensin receptor blockers, diuretics, digoxin , and antihyperlipidemics.   Congestive Heart Failure -Discuss the definition of CHF, how to live with CHF, the signs and symptoms of CHF, and how keep track of weight and sodium intake.   Heart Disease and Intimacy -Discus the effect sexual activity has on the heart, how changes occur during intimacy as we age, and safety during sexual activity.   Smoking Cessation / COPD -Discuss different methods to quit smoking, the health benefits of quitting smoking, and the definition of COPD.   Nutrition I: Fats -Discuss the types of cholesterol, what cholesterol does to the heart, and how cholesterol levels can be controlled.   Nutrition II: Labels -Discuss the different components of food labels and how to read food label   Heart Parts/Heart Disease and PAD -Discuss the anatomy of the heart, the pathway of blood circulation through the heart, and these are affected by heart disease.   Stress I: Signs and Symptoms -Discuss the causes of stress, how stress may lead to anxiety and depression, and ways to limit stress.   Stress II: Relaxation -Discuss different types of relaxation techniques to limit stress.   Warning Signs of Stroke /  TIA -Discuss definition of a stroke, what the signs and symptoms are of a stroke, and how to identify when someone is having stroke.   Knowledge Questionnaire Score:  Knowledge Questionnaire Score - 08/02/24 0825       Knowledge Questionnaire Score   Pre Score 23/26          Core Components/Risk Factors/Patient Goals at Admission:  Personal Goals and Risk Factors at Admission - 07/27/24 1306       Core Components/Risk Factors/Patient Goals on Admission   Diabetes Yes    Intervention Provide education about signs/symptoms and action to take for hypo/hyperglycemia.;Provide education about proper nutrition, including hydration, and aerobic/resistive exercise prescription along with prescribed medications to achieve blood glucose in normal ranges: Fasting  glucose 65-99 mg/dL    Expected Outcomes Short Term: Participant verbalizes understanding of the signs/symptoms and immediate care of hyper/hypoglycemia, proper foot care and importance of medication, aerobic/resistive exercise and nutrition plan for blood glucose control.;Long Term: Attainment of HbA1C < 7%.    Heart Failure Yes    Intervention Provide a combined exercise and nutrition program that is supplemented with education, support and counseling about heart failure. Directed toward relieving symptoms such as shortness of breath, decreased exercise tolerance, and extremity edema.    Expected Outcomes Improve functional capacity of life;Short term: Attendance in program 2-3 days a week with increased exercise capacity. Reported lower sodium intake. Reported increased fruit and vegetable intake. Reports medication compliance.;Short term: Daily weights obtained and reported for increase. Utilizing diuretic protocols set by physician.;Long term: Adoption of self-care skills and reduction of barriers for early signs and symptoms recognition and intervention leading to self-care maintenance.    Hypertension Yes    Intervention Provide  education on lifestyle modifcations including regular physical activity/exercise, weight management, moderate sodium restriction and increased consumption of fresh fruit, vegetables, and low fat dairy, alcohol moderation, and smoking cessation.;Monitor prescription use compliance.    Expected Outcomes Short Term: Continued assessment and intervention until BP is < 140/90mm HG in hypertensive participants. < 130/22mm HG in hypertensive participants with diabetes, heart failure or chronic kidney disease.;Long Term: Maintenance of blood pressure at goal levels.    Lipids Yes    Intervention Provide education and support for participant on nutrition & aerobic/resistive exercise along with prescribed medications to achieve LDL 70mg , HDL >40mg .    Expected Outcomes Short Term: Participant states understanding of desired cholesterol values and is compliant with medications prescribed. Participant is following exercise prescription and nutrition guidelines.;Long Term: Cholesterol controlled with medications as prescribed, with individualized exercise RX and with personalized nutrition plan. Value goals: LDL < 70mg , HDL > 40 mg.          Core Components/Risk Factors/Patient Goals Review:    Core Components/Risk Factors/Patient Goals at Discharge (Final Review):    ITP Comments:  ITP Comments     Row Name 07/27/24 1314 08/02/24 0826 08/09/24 0752 08/18/24 0745 09/14/24 0833   ITP Comments Completed virtual orientation today.  EP evaluation is scheduled for 08/02/24 at 0800 .  Documentation for diagnosis can be found in [facility] encounter 07/23/24. Patient attend orientation today.  Patient is attending Cardiac Rehabilitation Program.  Documentation for diagnosis can be found in [facility].  Reviewed medical chart, RPE/RPD, gym safety, and program guidelines.  Patient was fitted to equipment they will be using during rehab.  Patient is scheduled to start exercise on 08/09/24.   Initial ITP created and sent for review  and signature by Dr. Dorn Ross, Medical Director for Cardiac Rehabilitation Program. First full day of exercise!  Patient was oriented to gym and equipment including functions, settings, policies, and procedures.  Patient's individual exercise prescription and treatment plan were reviewed.  All starting workloads were established based on the results of the 6 minute walk test done at initial orientation visit.  The plan for exercise progression was also introduced and progression will be customized based on patient's performance and goals. 30 day review completed. ITP sent to Dr. Dorn Ross, Medical Director of Cardiac Rehab. Continue with ITP unless changes are made by physician.  No goals due to patient being new in program. 30 day review completed. ITP sent to Dr. Dorn Ross, Medical Director of Cardiac Rehab. Continue with ITP unless changes are  made by physician. Unable to get goals this round due to lack of attendance and admission.  He last attended on 08/25/24.      Comments: 30 day review

## 2024-09-15 ENCOUNTER — Ambulatory Visit (HOSPITAL_COMMUNITY)

## 2024-09-15 ENCOUNTER — Encounter (HOSPITAL_COMMUNITY)
Admission: RE | Admit: 2024-09-15 | Discharge: 2024-09-15 | Disposition: A | Source: Ambulatory Visit | Attending: Cardiology

## 2024-09-15 DIAGNOSIS — I5022 Chronic systolic (congestive) heart failure: Secondary | ICD-10-CM | POA: Diagnosis present

## 2024-09-15 DIAGNOSIS — D509 Iron deficiency anemia, unspecified: Secondary | ICD-10-CM | POA: Insufficient documentation

## 2024-09-15 DIAGNOSIS — I4510 Unspecified right bundle-branch block: Secondary | ICD-10-CM | POA: Insufficient documentation

## 2024-09-15 DIAGNOSIS — E1122 Type 2 diabetes mellitus with diabetic chronic kidney disease: Secondary | ICD-10-CM | POA: Insufficient documentation

## 2024-09-15 DIAGNOSIS — N1831 Chronic kidney disease, stage 3a: Secondary | ICD-10-CM | POA: Insufficient documentation

## 2024-09-15 DIAGNOSIS — I5023 Acute on chronic systolic (congestive) heart failure: Secondary | ICD-10-CM | POA: Insufficient documentation

## 2024-09-15 DIAGNOSIS — D631 Anemia in chronic kidney disease: Secondary | ICD-10-CM | POA: Insufficient documentation

## 2024-09-15 DIAGNOSIS — Z7984 Long term (current) use of oral hypoglycemic drugs: Secondary | ICD-10-CM | POA: Insufficient documentation

## 2024-09-15 DIAGNOSIS — E785 Hyperlipidemia, unspecified: Secondary | ICD-10-CM | POA: Insufficient documentation

## 2024-09-15 DIAGNOSIS — I130 Hypertensive heart and chronic kidney disease with heart failure and stage 1 through stage 4 chronic kidney disease, or unspecified chronic kidney disease: Secondary | ICD-10-CM | POA: Insufficient documentation

## 2024-09-15 NOTE — Progress Notes (Signed)
 Daily Session Note  Patient Details  Name: Jonathan Fowler MRN: [id number] Date of Birth: Oct 29, 1955 Referring Provider:   Flowsheet Row CARDIAC REHAB PHASE II ORIENTATION from 08/02/2024 in [facility] CARDIAC REHABILITATION  Referring Provider Ezra Shuck MD    Encounter Date: 09/15/2024  Check In:  Session Check In - 09/15/24 0758       Check-In   Supervising physician immediately available to respond to emergencies See telemetry face sheet for immediately available MD    Location [facility]    Staff Present Laymon Rattler, BSN, RN, WTA-C;Heather Con, BS, Exercise Physiologist    Virtual Visit No    Medication changes reported     No    Fall or balance concerns reported    No    Tobacco Cessation No Change    Warm-up and Cool-down Performed on first and last piece of equipment    Resistance Training Performed Yes    VAD Patient? No    PAD/SET Patient? No      Pain Assessment   Currently in Pain? No/denies          Capillary Blood Glucose: No results found for this or any previous visit (from the past 24 hours).    Social History   Tobacco Use  Smoking Status Never  Smokeless Tobacco Never    Goals Met:  Independence with exercise equipment Exercise tolerated well No report of concerns or symptoms today Strength training completed today  Goals Unmet:  Not Applicable  Comments: Pt able to follow exercise prescription today without complaint.  Will continue to monitor for progression.

## 2024-09-17 ENCOUNTER — Encounter (HOSPITAL_COMMUNITY)
Admission: RE | Admit: 2024-09-17 | Discharge: 2024-09-17 | Disposition: A | Source: Ambulatory Visit | Attending: Cardiology | Admitting: Cardiology

## 2024-09-17 ENCOUNTER — Ambulatory Visit (HOSPITAL_COMMUNITY)

## 2024-09-17 DIAGNOSIS — I5022 Chronic systolic (congestive) heart failure: Secondary | ICD-10-CM | POA: Diagnosis not present

## 2024-09-17 DIAGNOSIS — I5023 Acute on chronic systolic (congestive) heart failure: Secondary | ICD-10-CM | POA: Diagnosis not present

## 2024-09-17 NOTE — Progress Notes (Signed)
 Daily Session Note  Patient Details  Name: Jonathan Fowler MRN: [id number] Date of Birth: [birth year] Referring Provider:   Flowsheet Row CARDIAC REHAB PHASE II ORIENTATION from 08/02/2024 in [facility] CARDIAC REHABILITATION  Referring Provider Ezra Shuck MD    Encounter Date: 09/17/2024  Check In:  Session Check In - 09/17/24 0758       Check-In   Supervising physician immediately available to respond to emergencies See telemetry face sheet for immediately available MD    Location [facility]    Staff Present Laymon Rattler, BSN, RN, WTA-C;Heather Con, BS, Exercise Physiologist;Jessica Vonzell, MA, RCEP, CCRP, CCET    Virtual Visit No    Medication changes reported     No    Fall or balance concerns reported    No    Tobacco Cessation No Change    Warm-up and Cool-down Performed on first and last piece of equipment    Resistance Training Performed Yes    VAD Patient? No    PAD/SET Patient? No      Pain Assessment   Currently in Pain? No/denies          Capillary Blood Glucose: No results found for this or any previous visit (from the past 24 hours).    Social History   Tobacco Use  Smoking Status Never  Smokeless Tobacco Never    Goals Met:  Independence with exercise equipment Exercise tolerated well No report of concerns or symptoms today Strength training completed today  Goals Unmet:  Not Applicable  Comments: Pt able to follow exercise prescription today without complaint.  Will continue to monitor for progression.

## 2024-09-20 ENCOUNTER — Ambulatory Visit (HOSPITAL_COMMUNITY)
Admission: RE | Admit: 2024-09-20 | Discharge: 2024-09-20 | Disposition: A | Discharge status: Home / Self Care | Source: Ambulatory Visit | Attending: Internal Medicine | Admitting: Internal Medicine

## 2024-09-20 ENCOUNTER — Encounter (HOSPITAL_COMMUNITY)

## 2024-09-20 DIAGNOSIS — I5022 Chronic systolic (congestive) heart failure: Secondary | ICD-10-CM

## 2024-09-20 DIAGNOSIS — E269 Hyperaldosteronism, unspecified: Secondary | ICD-10-CM | POA: Diagnosis not present

## 2024-09-20 DIAGNOSIS — G4731 Primary central sleep apnea: Secondary | ICD-10-CM | POA: Diagnosis not present

## 2024-09-20 DIAGNOSIS — I5082 Biventricular heart failure: Secondary | ICD-10-CM | POA: Diagnosis not present

## 2024-09-20 DIAGNOSIS — G4761 Periodic limb movement disorder: Secondary | ICD-10-CM | POA: Diagnosis not present

## 2024-09-20 DIAGNOSIS — G4733 Obstructive sleep apnea (adult) (pediatric): Secondary | ICD-10-CM | POA: Diagnosis not present

## 2024-09-20 DIAGNOSIS — I10 Essential (primary) hypertension: Secondary | ICD-10-CM | POA: Diagnosis not present

## 2024-09-20 DIAGNOSIS — E119 Type 2 diabetes mellitus without complications: Secondary | ICD-10-CM | POA: Diagnosis not present

## 2024-09-20 LAB — BASIC METABOLIC PANEL WITH GFR
Anion gap: 9 (ref 5–15)
BUN: 37 mg/dL — ABNORMAL HIGH (ref 8–23)
CO2: 35 mmol/L — ABNORMAL HIGH (ref 22–32)
Calcium: 9.3 mg/dL (ref 8.9–10.3)
Chloride: 101 mmol/L (ref 98–111)
Creatinine, Ser: 2.2 mg/dL — ABNORMAL HIGH (ref 0.61–1.24)
GFR, Estimated: 32 mL/min — ABNORMAL LOW (ref >60)
Glucose, Bld: 113 mg/dL — ABNORMAL HIGH (ref 70–99)
Potassium: 3.5 mmol/L (ref 3.5–5.1)
Sodium: 145 mmol/L (ref 135–145)

## 2024-09-20 LAB — DIGOXIN LEVEL: Digoxin Level: <0.6 ng/mL — ABNORMAL LOW (ref 0.8–2.0)

## 2024-09-22 ENCOUNTER — Encounter (HOSPITAL_COMMUNITY)
Admission: RE | Admit: 2024-09-22 | Discharge: 2024-09-22 | Disposition: A | Discharge status: Home / Self Care | Source: Ambulatory Visit | Attending: Cardiology | Admitting: Cardiology

## 2024-09-22 DIAGNOSIS — I5022 Chronic systolic (congestive) heart failure: Secondary | ICD-10-CM | POA: Diagnosis not present

## 2024-09-22 NOTE — Progress Notes (Signed)
 Daily Session Note  Patient Details  Name: Jonathan Fowler MRN: [id number] Date of Birth: Jan 14, 1956 Referring Provider:   Flowsheet Row CARDIAC REHAB PHASE II ORIENTATION from 08/02/2024 in [facility] CARDIAC REHABILITATION  Referring Provider Ezra Shuck MD    Encounter Date: 09/22/2024  Check In:  Session Check In - 09/22/24 0801       Check-In   Supervising physician immediately available to respond to emergencies See telemetry face sheet for immediately available MD    Location [facility]    Staff Present Laymon Rattler, BSN, RN, WTA-C;Heather Con, BS, Exercise Physiologist;Jessica Vonzell, MA, RCEP, CCRP, CCET    Virtual Visit No    Medication changes reported     No    Fall or balance concerns reported    No    Tobacco Cessation No Change    Warm-up and Cool-down Performed on first and last piece of equipment    Resistance Training Performed Yes    VAD Patient? No    PAD/SET Patient? No      Pain Assessment   Currently in Pain? No/denies          Capillary Blood Glucose: No results found for this or any previous visit (from the past 24 hours).    Social History   Tobacco Use  Smoking Status Never  Smokeless Tobacco Never    Goals Met:  Independence with exercise equipment Exercise tolerated well No report of concerns or symptoms today Strength training completed today  Goals Unmet:  Not Applicable  Comments: Pt able to follow exercise prescription today without complaint.  Will continue to monitor for progression.

## 2024-09-23 ENCOUNTER — Telehealth (HOSPITAL_COMMUNITY): Payer: Self-pay | Admitting: Cardiology

## 2024-09-23 DIAGNOSIS — I5022 Chronic systolic (congestive) heart failure: Secondary | ICD-10-CM

## 2024-09-23 MED ORDER — POTASSIUM CHLORIDE CRYS ER 20 MEQ PO TBCR: 60.0000 meq | EXTENDED_RELEASE_TABLET | Freq: Every day | ORAL | 3 refills | Status: DC

## 2024-09-23 MED ORDER — TORSEMIDE 20 MG PO TABS: 60.0000 mg | ORAL_TABLET | Freq: Every day | ORAL | 3 refills | Status: DC

## 2024-09-23 NOTE — Telephone Encounter (Signed)
 Pt aware.

## 2024-09-23 NOTE — Telephone Encounter (Signed)
 Patient called to report concerns with increased weight and SOB Reports he was unable to rest last night with SOB  Weight today 223 Normal weight 215  Reports compliance with medication Torsemide  40 mg-took 60 mg this AM  Please advise

## 2024-09-24 ENCOUNTER — Ambulatory Visit (HOSPITAL_COMMUNITY)

## 2024-09-24 ENCOUNTER — Telehealth (HOSPITAL_COMMUNITY): Payer: Self-pay

## 2024-09-24 NOTE — Telephone Encounter (Signed)
 Forms E-mailed to [facility] on 09/14/24. My chart message sent to patient to inform him forms have been sent

## 2024-09-26 ENCOUNTER — Telehealth: Payer: Self-pay | Admitting: Physician Assistant

## 2024-09-26 NOTE — Telephone Encounter (Signed)
 Pt called stating his new medications have improved his swelling but feels poorly and could barely walk up the stairs. Today, he slept in and has not taken medication yet and feels much better. He is calling asking for guidance on medications. He is not certain his SOB has improved.  I reviewed recent labs.  I advised to take his medications to include  his daily torsemide  but skip digoxin . He is due for labs tomorrow morning. He states he can't exist like this.   Will send message to the team to see if he can be seen tomorrow for further medication adjustments.

## 2024-09-27 ENCOUNTER — Encounter (HOSPITAL_COMMUNITY): Payer: Self-pay | Admitting: Cardiology

## 2024-09-27 ENCOUNTER — Encounter (HOSPITAL_COMMUNITY)

## 2024-09-27 ENCOUNTER — Encounter (HOSPITAL_COMMUNITY): Payer: Self-pay

## 2024-09-27 ENCOUNTER — Ambulatory Visit (HOSPITAL_COMMUNITY): Admission: RE | Admit: 2024-09-27

## 2024-09-27 ENCOUNTER — Ambulatory Visit (HOSPITAL_COMMUNITY)
Admission: RE | Admit: 2024-09-27 | Discharge: 2024-09-27 | Disposition: A | Discharge status: Home / Self Care | Source: Ambulatory Visit | Attending: Cardiology | Admitting: Cardiology

## 2024-09-27 VITALS — BP 140/80 | HR 69 | Wt 219.0 lb

## 2024-09-27 DIAGNOSIS — I5022 Chronic systolic (congestive) heart failure: Secondary | ICD-10-CM

## 2024-09-27 DIAGNOSIS — I130 Hypertensive heart and chronic kidney disease with heart failure and stage 1 through stage 4 chronic kidney disease, or unspecified chronic kidney disease: Secondary | ICD-10-CM | POA: Diagnosis not present

## 2024-09-27 DIAGNOSIS — R063 Periodic breathing: Secondary | ICD-10-CM | POA: Diagnosis not present

## 2024-09-27 DIAGNOSIS — Z79899 Other long term (current) drug therapy: Secondary | ICD-10-CM | POA: Insufficient documentation

## 2024-09-27 DIAGNOSIS — I10 Essential (primary) hypertension: Secondary | ICD-10-CM | POA: Diagnosis not present

## 2024-09-27 DIAGNOSIS — N1831 Chronic kidney disease, stage 3a: Secondary | ICD-10-CM | POA: Insufficient documentation

## 2024-09-27 DIAGNOSIS — I5082 Biventricular heart failure: Secondary | ICD-10-CM | POA: Diagnosis not present

## 2024-09-27 DIAGNOSIS — G4733 Obstructive sleep apnea (adult) (pediatric): Secondary | ICD-10-CM | POA: Diagnosis not present

## 2024-09-27 LAB — BASIC METABOLIC PANEL WITH GFR
Anion gap: 10 (ref 5–15)
BUN: 53 mg/dL — ABNORMAL HIGH (ref 8–23)
CO2: 32 mmol/L (ref 22–32)
Calcium: 8.8 mg/dL — ABNORMAL LOW (ref 8.9–10.3)
Chloride: 101 mmol/L (ref 98–111)
Creatinine, Ser: 2.43 mg/dL — ABNORMAL HIGH (ref 0.61–1.24)
GFR, Estimated: 28 mL/min — ABNORMAL LOW (ref >60)
Glucose, Bld: 118 mg/dL — ABNORMAL HIGH (ref 70–99)
Potassium: 3.7 mmol/L (ref 3.5–5.1)
Sodium: 143 mmol/L (ref 135–145)

## 2024-09-27 LAB — BRAIN NATRIURETIC PEPTIDE: B Natriuretic Peptide: >4500 pg/mL — ABNORMAL HIGH (ref 0.0–100.0)

## 2024-09-27 MED ORDER — TORSEMIDE 20 MG PO TABS: 20.0000 mg | ORAL_TABLET | Freq: Every day | ORAL | Status: DC

## 2024-09-27 MED ORDER — POTASSIUM CHLORIDE CRYS ER 20 MEQ PO TBCR: 20.0000 meq | EXTENDED_RELEASE_TABLET | Freq: Two times a day (BID) | ORAL | Status: DC

## 2024-09-27 NOTE — Progress Notes (Signed)
 "  ADVANCED HEART FAILURE FOLLOW UP CLINIC NOTE  Referring Physician: Regino Slater, MD  Primary Care: Regino Slater, MD Primary Cardiologist:  HPI: Jonathan Fowler is a 68 y.o. male who presents for follow up of chronic systolic heart failure.      Admitted 9/25 with acute systolic heart failure. Took BP meds at home PTA and reported full compliance with them. Echo showed EF <20%, AHF consulted.  Underwent RHC 07/02/2024 with severely elevated biventricular filling pressures and reduced cardiac index, LHC deferred with elevated renal function. Diuresed well with IV lasix , renal function improved. cMRI with NiCM BiV HF, LVEF 15%.As renal function improved, underwent [facility] 07/05/24 with nonobs CAD, elevated filling pressures and preserved CO. Required further diuresis and GDMT titrated. He was discharged home, weight 213 lbs.      SUBJECTIVE:   Patient recently had his diuretics increased at the beginning of last week.  He reports that for the past 4 to 5 days he has been feeling very poorly, more short of breath and fatigued, barely able to do anything.  He decreased his diuretics to 20 mg daily and is feeling better today.  Reports that he is breathing easier at night and is hopeful to have his BiPAP ready soon.  Briefly discussed with monitoring strategy such as CardioMEMS, given that he is feeling better he would like to defer at this time.  Blood pressure overall at home is usually in the 120s, has been mildly elevated this past week.  PMH, current medications, allergies, social history, and family history reviewed in epic.  PHYSICAL EXAM: Vitals:   09/27/24 0858  BP: (!) 140/80  Pulse: 69  SpO2: 100%    GENERAL: NAD, fair appearing PULM:  Normal work of breathing, CTAB CARDIAC:  JVP: Flat         Normal rate with regular rhythm. No murmurs, rubs or gallops.  No edema. Warm and well perfused extremities. ABDOMEN: Soft, non-tender, non-distended. NEUROLOGIC: Patient is  oriented x3 with no focal or lateralizing neurologic deficits.      DATA REVIEW  ECG: Sinus rhythm, first-degree AV block, right bundle branch block with QRS duration 174  ECHO: 06/2024: LVEF less than 20%, grade 2 diastolic dysfunction, RV moderately reduced 09/06/2024: Severe biventricular failure, LVEF less than 20%, severely reduced RV systolic function, no significant valvular disease  CATH: 07/05/2024: Nonobstructive CAD, RA 11, PA 67/31 (42), PCWP 21, compensated assumed Fick CO/CI  CMR: 06/2024: LVEF 16%, RVEF 29%, no evidence of infiltrative disease, nonischemic cardiomyopathy  ASSESSMENT & PLAN:  Chronic systolic heart failure: Severely reduced biventricular function, still quite symptomatic, NYHA class III. Will likely plan for repeat RHC at next visit. Unfortunately his creatinine trend is concerning and between that and his biventricular failure limits candidacy for LVAD. If he were to worsen, has a small window for potential H/K.  - Concerned about clinical trajectory although thankfully is tolerating excellent doses of GDMT - Echocardiogram scheduled for later this month - Reduce torsemide  back to daily dosing - Continue entresto  97/103mg  BID, spironolactone  25mg  daily, carvedilol  3.125mg  BID, jardiance  10mg  daily - Off digoxin , did not tolerate - CPX testing fair - Based on results we will need to consider advanced therapies versus potential CRT-D and watchful waiting  Hypertension: -Well-controlled on medical therapy  CKD IIIa - Followed by Dr Tobie.  - Baseline SCr 1.4-1.7 - Continue Jardiance , up to 2.4 with diuresis, back off as above - Repeat at next visit  Follow up in 1  I spent 42 minutes caring for this patient today including face to face time, ordering and reviewing labs, reviewing records from CPX testing, seeing the patient, documenting in the record, and arranging follow ups.    Morene Brownie, MD Advanced Heart Failure Mechanical Circulatory  Support 10/13/2024 "

## 2024-09-27 NOTE — Patient Instructions (Signed)
 STOP Digoxin ,  CHANGE Potassium to 1 tablet Twice daily  CHANGE Torsemide  to 20 mg daily.  Labs done today, your results will be available in MyChart, we will contact you for abnormal readings.  You have been referred to Electrophysiology. They will call you to arrange your appointment.  Your physician recommends that you schedule a follow-up appointment in: 2 months (February 2026) ** PLEASE CALL THE OFFICE IN [place] TO ARRANGE YOUR FOLLOW UP APPOINTMENT.**  If you have any questions or concerns before your next appointment please send us  a message through [place] or call our office at [phone].    TO LEAVE A MESSAGE FOR THE NURSE SELECT OPTION 2, PLEASE LEAVE A MESSAGE INCLUDING: YOUR NAME DATE OF BIRTH CALL BACK NUMBER REASON FOR CALL**this is important as we prioritize the call backs  YOU WILL RECEIVE A CALL BACK THE SAME DAY AS LONG AS YOU CALL BEFORE 4:00 PM  At the [facility], you and your health needs are our priority. As part of our continuing mission to provide you with exceptional heart care, we have created designated Provider Care Teams. These Care Teams include your primary Cardiologist (physician) and Advanced Practice Providers (APPs- Physician Assistants and Nurse Practitioners) who all work together to provide you with the care you need, when you need it.   You may see any of the following providers on your designated Care Team at your next follow up: Dr Toribio Fuel Dr Ezra Shuck Dr. Morene Brownie Greig Mosses, NP Caffie Shed, [place] [facility] [place], [place] Beckey Coe, NP Jordan Lee, NP Ellouise Class, NP Tinnie Redman, PharmD Jaun Bash, PharmD   Please be sure to bring in all your medications bottles to every appointment.    Thank you for choosing [facility]

## 2024-09-29 ENCOUNTER — Encounter (HOSPITAL_COMMUNITY): Admission: RE | Admit: 2024-09-29 | Discharge: 2024-09-29 | Attending: Cardiology

## 2024-09-29 DIAGNOSIS — I5022 Chronic systolic (congestive) heart failure: Secondary | ICD-10-CM

## 2024-09-29 NOTE — Progress Notes (Signed)
 Daily Session Note  Patient Details  Name: Jonathan Fowler MRN: [id number] Date of Birth: Jun 28, 1956 Referring Provider:   Flowsheet Row CARDIAC REHAB PHASE II ORIENTATION from 08/02/2024 in [facility] CARDIAC REHABILITATION  Referring Provider Ezra Shuck MD    Encounter Date: 09/29/2024  Check In:  Session Check In - 09/29/24 0759       Check-In   Supervising physician immediately available to respond to emergencies See telemetry face sheet for immediately available MD    Location [facility]    Staff Present Laymon Rattler, BSN, RN, Rosalba Gelineau, MA, RCEP, CCRP, CCET    Virtual Visit No    Medication changes reported     No    Fall or balance concerns reported    No    Warm-up and Cool-down Performed on first and last piece of equipment    Resistance Training Performed Yes    VAD Patient? No    PAD/SET Patient? No      Pain Assessment   Currently in Pain? No/denies          Capillary Blood Glucose: No results found for this or any previous visit (from the past 24 hours).    Tobacco Use History[1]  Goals Met:  Independence with exercise equipment Exercise tolerated well No report of concerns or symptoms today Strength training completed today  Goals Unmet:  Not Applicable  Comments: Pt able to follow exercise prescription today without complaint.  Will continue to monitor for progression.        [1]  Social History Tobacco Use  Smoking Status Never  Smokeless Tobacco Never

## 2024-10-01 ENCOUNTER — Ambulatory Visit (HOSPITAL_COMMUNITY)

## 2024-10-04 ENCOUNTER — Encounter (HOSPITAL_COMMUNITY)

## 2024-10-06 ENCOUNTER — Encounter (HOSPITAL_COMMUNITY)

## 2024-10-08 ENCOUNTER — Ambulatory Visit (HOSPITAL_COMMUNITY)
Admission: RE | Admit: 2024-10-08 | Discharge: 2024-10-08 | Disposition: A | Discharge status: Home / Self Care | Source: Ambulatory Visit | Attending: Cardiology | Admitting: Cardiology

## 2024-10-08 DIAGNOSIS — N183 Chronic kidney disease, stage 3 unspecified: Secondary | ICD-10-CM | POA: Insufficient documentation

## 2024-10-08 DIAGNOSIS — I130 Hypertensive heart and chronic kidney disease with heart failure and stage 1 through stage 4 chronic kidney disease, or unspecified chronic kidney disease: Secondary | ICD-10-CM | POA: Diagnosis not present

## 2024-10-08 DIAGNOSIS — E785 Hyperlipidemia, unspecified: Secondary | ICD-10-CM | POA: Diagnosis not present

## 2024-10-08 DIAGNOSIS — I5022 Chronic systolic (congestive) heart failure: Secondary | ICD-10-CM | POA: Diagnosis present

## 2024-10-08 LAB — ECHOCARDIOGRAM LIMITED
AR max vel: 1.63 cm2
AV Area VTI: 1.44 cm2
AV Area mean vel: 1.5 cm2
AV Mean grad: 3 mmHg
AV Peak grad: 5.3 mmHg
Ao pk vel: 1.15 m/s
Area-P 1/2: 3.42 cm2
Calc EF: 18.4 %
Est EF: <20
S' Lateral: 6.1 cm
Single Plane A2C EF: 22.8 %
Single Plane A4C EF: 19 %

## 2024-10-11 ENCOUNTER — Encounter (HOSPITAL_COMMUNITY)

## 2024-10-12 ENCOUNTER — Encounter (HOSPITAL_COMMUNITY): Payer: Self-pay | Admitting: *Deleted

## 2024-10-12 DIAGNOSIS — I5022 Chronic systolic (congestive) heart failure: Secondary | ICD-10-CM

## 2024-10-12 NOTE — Progress Notes (Signed)
 Cardiac Individual Treatment Plan  Patient Details  Name: Jonathan Fowler MRN: [id number] Date of Birth: Sep 11, 1956 Referring Provider:   Flowsheet Row CARDIAC REHAB PHASE II ORIENTATION from 08/02/2024 in [facility]ARD[facility]AC RE[facility]ILITATION  Referring Provider Ezra Shuck MD    Initial Encounter Date:  Flowsheet Row CARDIAC REHAB PHASE II ORIENTATION from 08/02/2024 in [place] CARDIAC REHABILITATION  Date 08/02/24    Visit Diagnosis: Chronic systolic CHF (congestive heart failure) (HCC)  Patient's Home Medications on Admission: Current Medications[1]  Past Medical History: Past Medical History:  Diagnosis Date   High cholesterol    Hypertension    Vertigo     Tobacco Use: Tobacco Use History[2]  Labs: Review Flowsheet  More data may exist      Latest Ref Rng & Units 03/28/2013 06/30/2024 07/02/2024 07/05/2024 07/12/2024  Labs for ITP Cardiac and Pulmonary Rehab  Hemoglobin A1c - - 5.5  - - 6.0      Bicarbonate 20.0 - 28.0 mmol/L 20.0 - 28.0 mmol/L - - 28.5  28.9  28.3  27.6  -  TCO2 22 - 32 mmol/L 22 - 32 mmol/L 29  - 30  30  30  29   -  O2 Saturation % % - - 64  64  67  66  -    Details       This result is from an external source.   Multiple values from one day are sorted in reverse-chronological order         Capillary Blood Glucose: Lab Results  Component Value Date   GLUCAP 106 (H) 07/03/2024   GLUCAP 120 (H) 07/02/2024   GLUCAP 105 (H) 07/02/2024   GLUCAP 147 (H) 07/02/2024   GLUCAP 92 07/02/2024     Exercise Target Goals: Exercise Program Goal: Individual exercise prescription set using results from initial 6 min walk test and THRR while considering  patients activity barriers and safety.   Exercise Prescription Goal: Starting with aerobic activity 30 plus minutes a day, 3 days per week for initial exercise prescription. Provide home exercise prescription and guidelines that participant acknowledges understanding prior to  discharge.  Activity Barriers & Risk Stratification:  Activity Barriers & Cardiac Risk Stratification - 07/27/24 1304       Activity Barriers & Cardiac Risk Stratification   Activity Barriers Left Knee Replacement;Joint Problems;Deconditioning;Muscular Weakness    Cardiac Risk Stratification High   Supposed to have R knee replaced but it was cancelled, causes him pain.         6 Minute Walk:  6 Minute Walk     Row Name 08/02/24 0841         6 Minute Walk   Phase Initial     Distance 1280 feet     Walk Time 6 minutes     # of Rest Breaks 0     MPH 2.42     METS 2.75     RPE 11     VO2 Peak 9.6     Symptoms No     Resting HR 60 bpm     Resting BP 114/70     Resting Oxygen Saturation  97 %     Exercise Oxygen Saturation  during 6 min walk 97 %     Max Ex. HR 95 bpm     Max Ex. BP 130/60     2 Minute Post BP 118/64        Oxygen Initial Assessment:   Oxygen Re-Evaluation:   Oxygen Discharge (  Final Oxygen Re-Evaluation):   Initial Exercise Prescription:  Initial Exercise Prescription - 08/02/24 0800       Date of Initial Exercise RX and Referring Provider   Date 08/02/24    Referring Provider Ezra Shuck MD      Treadmill   MPH 2    Grade 0.5    Minutes 15    METs 2.67      REL-XR   Level 2    Speed 50    Minutes 15    METs 2      Prescription Details   Frequency (times per week) 2    Duration Progress to 30 minutes of continuous aerobic without signs/symptoms of physical distress      Intensity   THRR 40-80% of Max Heartrate 97-134    Ratings of Perceived Exertion 11-13    Perceived Dyspnea 0-4      Resistance Training   Training Prescription Yes    Weight 4    Reps 10-15          Perform Capillary Blood Glucose checks as needed.  Exercise Prescription Changes:   Exercise Prescription Changes     Row Name 08/02/24 0800 08/18/24 1200 09/29/24 1500         Response to Exercise   Blood Pressure (Admit) 114/70 144/100  128/80     Blood Pressure (Exercise) 130/60 140/98 --     Blood Pressure (Exit) 118/64 140/80 110/70     Heart Rate (Admit) 60 bpm 70 bpm 74 bpm     Heart Rate (Exercise) 95 bpm 107 bpm 86 bpm     Heart Rate (Exit) 95 bpm 89 bpm 74 bpm     Oxygen Saturation (Admit) 97 % -- --     Oxygen Saturation (Exercise) 97 % -- --     Oxygen Saturation (Exit) 97 % -- --     Rating of Perceived Exertion (Exercise) 11 13 14      Perceived Dyspnea (Exercise) 0 -- --     Duration -- Continue with 30 min of aerobic exercise without signs/symptoms of physical distress. Continue with 30 min of aerobic exercise without signs/symptoms of physical distress.     Intensity -- THRR unchanged THRR unchanged       Progression   Progression -- Continue to progress workloads to maintain intensity without signs/symptoms of physical distress. Continue to progress workloads to maintain intensity without signs/symptoms of physical distress.       Resistance Training   Training Prescription -- Yes --     Weight -- 4 4     Reps -- 10-15 10-15       Treadmill   MPH -- 2.2 1.5     Grade -- 0 0     Minutes -- 15 15     METs -- 2.69 2.15       REL-XR   Level -- 1 1     Speed -- 46 50     Minutes -- 15 15     METs -- 3.7 2.7        Exercise Comments:   Exercise Comments     Row Name 07/27/24 1305 08/02/24 0826 08/09/24 0752       Exercise Comments Mohammed states he doesn't do much exercise at home besides walking some. Patient attend orientation today.  Patient is attending Cardiac Rehabilitation Program.  Documentation for diagnosis can be found in [facility].  Reviewed medical chart, RPE/RPD, gym safety, and program guidelines.  Patient was  fitted to equipment they will be using during rehab.  Patient is scheduled to start exercise on 08/09/24.   Initial ITP created and sent for review and signature by Dr. Dorn Ross, Medical Director for Cardiac Rehabilitation Program. First full day of exercise!  Patient was  oriented to gym and equipment including functions, settings, policies, and procedures.  Patient's individual exercise prescription and treatment plan were reviewed.  All starting workloads were established based on the results of the 6 minute walk test done at initial orientation visit.  The plan for exercise progression was also introduced and progression will be customized based on patient's performance and goals.        Exercise Goals and Review:   Exercise Goals     Row Name 07/27/24 1305             Exercise Goals   Increase Physical Activity Yes       Intervention Provide advice, education, support and counseling about physical activity/exercise needs.;Develop an individualized exercise prescription for aerobic and resistive training based on initial evaluation findings, risk stratification, comorbidities and participant's personal goals.       Expected Outcomes Short Term: Attend rehab on a regular basis to increase amount of physical activity.;Long Term: Add in home exercise to make exercise part of routine and to increase amount of physical activity.;Long Term: Exercising regularly at least 3-5 days a week.       Increase Strength and Stamina Yes       Intervention Provide advice, education, support and counseling about physical activity/exercise needs.;Develop an individualized exercise prescription for aerobic and resistive training based on initial evaluation findings, risk stratification, comorbidities and participant's personal goals.       Expected Outcomes Short Term: Increase workloads from initial exercise prescription for resistance, speed, and METs.;Long Term: Improve cardiorespiratory fitness, muscular endurance and strength as measured by increased METs and functional capacity ( );Short Term: Perform resistance training exercises routinely during rehab and add in resistance training at home       Able to understand and use rate of perceived exertion (RPE) scale Yes        Intervention Provide education and explanation on how to use RPE scale       Expected Outcomes Short Term: Able to use RPE daily in rehab to express subjective intensity level;Long Term:  Able to use RPE to guide intensity level when exercising independently       Able to understand and use Dyspnea scale Yes       Intervention Provide education and explanation on how to use Dyspnea scale       Expected Outcomes Short Term: Able to use Dyspnea scale daily in rehab to express subjective sense of shortness of breath during exertion;Long Term: Able to use Dyspnea scale to guide intensity level when exercising independently       Knowledge and understanding of Target Heart Rate Range (THRR) Yes       Intervention Provide education and explanation of THRR including how the numbers were predicted and where they are located for reference       Expected Outcomes Short Term: Able to state/look up THRR;Short Term: Able to use daily as guideline for intensity in rehab;Long Term: Able to use THRR to govern intensity when exercising independently       Able to check pulse independently Yes       Intervention Provide education and demonstration on how to check pulse in carotid and radial arteries.;Review the importance of  being able to check your own pulse for safety during independent exercise       Expected Outcomes Long Term: Able to check pulse independently and accurately;Short Term: Able to explain why pulse checking is important during independent exercise       Understanding of Exercise Prescription Yes       Intervention Provide education, explanation, and written materials on patient's individual exercise prescription       Expected Outcomes Short Term: Able to explain program exercise prescription;Long Term: Able to explain home exercise prescription to exercise independently          Exercise Goals Re-Evaluation :  Exercise Goals Re-Evaluation     Row Name 08/09/24 0753 09/22/24 0829            Exercise Goal Re-Evaluation   Exercise Goals Review Increase Physical Activity;Increase Strength and Stamina;Understanding of Exercise Prescription Increase Physical Activity;Increase Strength and Stamina;Understanding of Exercise Prescription      Comments Reviewed RPE and dyspnea scale, THR and program prescription with pt today.  Pt voiced understanding and was given a copy of goals to take home. [name] is doing well in rehab! He is increasing his levels on the treadmill and XR very nicely. He has an active job, so when he is not here 3 days a week, he is active with his job. Did encourage to incorporate some exercise at home on days he is not here.      Expected Outcomes Short: Use RPE daily to regulate intensity.  Long: Follow program prescription in THR. Short: Continue to attend rehab. Long: Incorporate exercise at home.          Discharge Exercise Prescription (Final Exercise Prescription Changes):  Exercise Prescription Changes - 09/29/24 1500       Response to Exercise   Blood Pressure (Admit) 128/80    Blood Pressure (Exit) 110/70    Heart Rate (Admit) 74 bpm    Heart Rate (Exercise) 86 bpm    Heart Rate (Exit) 74 bpm    Rating of Perceived Exertion (Exercise) 14    Duration Continue with 30 min of aerobic exercise without signs/symptoms of physical distress.    Intensity THRR unchanged      Progression   Progression Continue to progress workloads to maintain intensity without signs/symptoms of physical distress.      Resistance Training   Weight 4    Reps 10-15      Treadmill   MPH 1.5    Grade 0    Minutes 15    METs 2.15      REL-XR   Level 1    Speed 50    Minutes 15    METs 2.7          Nutrition:  Target Goals: Understanding of nutrition guidelines, daily intake of sodium 1500mg , cholesterol 200mg , calories 30% from fat and 7% or less from saturated fats, daily to have 5 or more servings of fruits and vegetables.  Biometrics:  Pre Biometrics -  08/02/24 0843       Pre Biometrics   Height 5' 11 (1.803 m)    Weight 223 lb 8.7 oz (101.4 kg)    Waist Circumference 41 inches    Hip Circumference 44 inches    Waist to Hip Ratio 0.93 %    BMI (Calculated) 31.19    Grip Strength 35.1 kg    Single Leg Stand 30 seconds           Nutrition Therapy Plan  and Nutrition Goals:  Nutrition Therapy & Goals - 07/27/24 1306       Intervention Plan   Intervention Nutrition handout(s) given to patient.;Prescribe, educate and counsel regarding individualized specific dietary modifications aiming towards targeted core components such as weight, hypertension, lipid management, diabetes, heart failure and other comorbidities.    Expected Outcomes Long Term Goal: Adherence to prescribed nutrition plan.;Short Term Goal: A plan has been developed with personal nutrition goals set during dietitian appointment.;Short Term Goal: Understand basic principles of dietary content, such as calories, fat, sodium, cholesterol and nutrients.          Nutrition Assessments:  Nutrition Assessments - 08/02/24 0822       Rate Your Plate Scores   Pre Score 75         MEDIFICTS Score Key: >=70 Need to make dietary changes  40-70 Heart Healthy Diet <= 40 Therapeutic Level Cholesterol Diet  Flowsheet Row CARDIAC REHAB PHASE II ORIENTATION from 08/02/2024 in [facility] C[facility]AC RE[facility]ILITATION  Picture Your Plate Total Score on Admission 75   Picture Your Plate Scores: <59 Unhealthy dietary pattern with much room for improvement. 41-50 Dietary pattern unlikely to meet recommendations for good health and room for improvement. 51-60 More healthful dietary pattern, with some room for improvement.  >60 Healthy dietary pattern, although there may be some specific behaviors that could be improved.    Nutrition Goals Re-Evaluation:  Nutrition Goals Re-Evaluation     Row Name 09/22/24 [phone]             Goals   Nutrition Goal Healthy eating        Comment [name] states his diet is going well. He drinks enough water , sometimes too much he states. He is incorporating more fresh fruits and veggies.       Expected Outcome Short: Continue to attend rehab. Long: Continue to drink adequate amount of water  but not too much.          Nutrition Goals Discharge (Final Nutrition Goals Re-Evaluation):  Nutrition Goals Re-Evaluation - 09/22/24 0834       Goals   Nutrition Goal Healthy eating    Comment [name] states his diet is going well. He drinks enough water , sometimes too much he states. He is incorporating more fresh fruits and veggies.    Expected Outcome Short: Continue to attend rehab. Long: Continue to drink adequate amount of water  but not too much.          Psychosocial: Target Goals: Acknowledge presence or absence of significant depression and/or stress, maximize coping skills, provide positive support system. Participant is able to verbalize types and ability to use techniques and skills needed for reducing stress and depression.  Initial Review & Psychosocial Screening:  Initial Psych Review & Screening - 07/27/24 1306       Initial Review   Current issues with Current Stress Concerns    Source of Stress Concerns Chronic Illness    Comments Aurthur has been a little stressed about his new onset heart failure diagnosis.      Family Dynamics   Good Support System? Yes    Comments His wife Darice is a goos support system for him.      Barriers   Psychosocial barriers to participate in program There are no identifiable barriers or psychosocial needs.      Screening Interventions   Interventions Encouraged to exercise    Expected Outcomes Short Term goal: Utilizing psychosocial counselor, staff and physician to assist with identification of  specific Stressors or current issues interfering with healing process. Setting desired goal for each stressor or current issue identified.;Long Term Goal: Stressors or current issues are  controlled or eliminated.;Long Term goal: The participant improves quality of Life and PHQ9 Scores as seen by post scores and/or verbalization of changes;Short Term goal: Identification and review with participant of any Quality of Life or Depression concerns found by scoring the questionnaire.          Quality of Life Scores:  Quality of Life - 08/02/24 0849       Quality of Life   Select Quality of Life      Quality of Life Scores   Health/Function Pre 21.77 %    Socioeconomic Pre 27.81 %    Psych/Spiritual Pre 27.21 %    Family Pre 25.2 %    GLOBAL Pre 24.73 %         Scores of 19 and below usually indicate a poorer quality of life in these areas.  A difference of  2-3 points is a clinically meaningful difference.  A difference of 2-3 points in the total score of the Quality of Life Index has been associated with significant improvement in overall quality of life, self-image, physical symptoms, and general health in studies assessing change in quality of life.  PHQ-9: Review Flowsheet       08/02/2024  Depression screen PHQ 2/9  Decreased Interest 1  Down, Depressed, Hopeless 0  PHQ - 2 Score 1  Altered sleeping 0  Tired, decreased energy 1  Change in appetite 0  Feeling bad or failure about yourself  0  Trouble concentrating 0  Moving slowly or fidgety/restless 0  Suicidal thoughts 0  PHQ-9 Score 2   Difficult doing work/chores Not difficult at all    Details       Data saved with a previous flowsheet row definition        Interpretation of Total Score  Total Score Depression Severity:  1-4 = Minimal depression, 5-9 = Mild depression, 10-14 = Moderate depression, 15-19 = Moderately severe depression, 20-27 = Severe depression   Psychosocial Evaluation and Intervention:  Psychosocial Evaluation - 07/27/24 1307       Psychosocial Evaluation & Interventions   Interventions Stress management education;Encouraged to exercise with the program and follow  exercise prescription    Comments Keshon is a 68 year old male who is coming into rehab with a new diagnosis of heart failure. His EF at rest is <20% which makes his cardiac risk stratification high. He currently is back to work at [employer]. He has his wife Darice as a supoort system. He is a little stressed about this new diagnosis. His mobility is ok, he does have issues with his right knee which was supposed to be replaced, but got cancelled. He has had a left knee replacement in July of 2025. He is a diabetic and takes Metformin daily, but doesn't check his sugar ever. His home exercise includes walking a little bit.    Expected Outcomes Short: Increase strength and stamina. Long: Help increase longevity of life.    Continue Psychosocial Services  Follow up required by staff          Psychosocial Re-Evaluation:  Psychosocial Re-Evaluation     Row Name 09/22/24 [phone]             Psychosocial Re-Evaluation   Current issues with Current Sleep Concerns       Comments [name] is doing well in rehab!  He states there is no current stressors going on at the moment. He is having some sleep issues, he only sleeps about 4 hours a night. He had a sleep study the other day and he may be getting a new CPAP mask, which he feels would help him sleep better.       Expected Outcomes Short; Continue to attend rehab. Long: Follow MD directions regarding sleep study results.       Interventions Encouraged to attend Cardiac Rehabilitation for the exercise       Continue Psychosocial Services  Follow up required by staff          Psychosocial Discharge (Final Psychosocial Re-Evaluation):  Psychosocial Re-Evaluation - 09/22/24 9167       Psychosocial Re-Evaluation   Current issues with Current Sleep Concerns    Comments [name] is doing well in rehab! He states there is no current stressors going on at the moment. He is having some sleep issues, he only sleeps about 4 hours a night. He had a sleep study the  other day and he may be getting a new CPAP mask, which he feels would help him sleep better.    Expected Outcomes Short; Continue to attend rehab. Long: Follow MD directions regarding sleep study results.    Interventions Encouraged to attend Cardiac Rehabilitation for the exercise    Continue Psychosocial Services  Follow up required by staff          Vocational Rehabilitation: Provide vocational rehab assistance to qualifying candidates.   Vocational Rehab Evaluation & Intervention:  Vocational Rehab - 07/27/24 1306       Initial Vocational Rehab Evaluation & Intervention   Assessment shows need for Vocational Rehabilitation No          Education: Education Goals: Education classes will be provided on a weekly basis, covering required topics. Participant will state understanding/return demonstration of topics presented.  Learning Barriers/Preferences:  Learning Barriers/Preferences - 07/27/24 1306       Learning Barriers/Preferences   Learning Barriers None    Learning Preferences None          Education Topics: Hypertension, Hypertension Reduction -Define heart disease and high blood pressure. Discus how high blood pressure affects the body and ways to reduce high blood pressure.   Exercise and Your Heart -Discuss why it is important to exercise, the FITT principles of exercise, normal and abnormal responses to exercise, and how to exercise safely.   Angina -Discuss definition of angina, causes of angina, treatment of angina, and how to decrease risk of having angina.   Cardiac Medications -Review what the following cardiac medications are used for, how they affect the body, and side effects that may occur when taking the medications.  Medications include Aspirin , Beta blockers, calcium  channel blockers, ACE Inhibitors, angiotensin receptor blockers, diuretics, digoxin , and antihyperlipidemics.   Congestive Heart Failure -Discuss the definition of CHF, how  to live with CHF, the signs and symptoms of CHF, and how keep track of weight and sodium intake.   Heart Disease and Intimacy -Discus the effect sexual activity has on the heart, how changes occur during intimacy as we age, and safety during sexual activity.   Smoking Cessation / COPD -Discuss different methods to quit smoking, the health benefits of quitting smoking, and the definition of COPD.   Nutrition I: Fats -Discuss the types of cholesterol, what cholesterol does to the heart, and how cholesterol levels can be controlled.   Nutrition II: Labels -Discuss the different components  of food labels and how to read food label   Heart Parts/Heart Disease and PAD -Discuss the anatomy of the heart, the pathway of blood circulation through the heart, and these are affected by heart disease.   Stress I: Signs and Symptoms -Discuss the causes of stress, how stress may lead to anxiety and depression, and ways to limit stress.   Stress II: Relaxation -Discuss different types of relaxation techniques to limit stress.   Warning Signs of Stroke / TIA -Discuss definition of a stroke, what the signs and symptoms are of a stroke, and how to identify when someone is having stroke.   Knowledge Questionnaire Score:  Knowledge Questionnaire Score - 08/02/24 0825       Knowledge Questionnaire Score   Pre Score 23/26          Core Components/Risk Factors/Patient Goals at Admission:  Personal Goals and Risk Factors at Admission - 07/27/24 1306       Core Components/Risk Factors/Patient Goals on Admission   Diabetes Yes    Intervention Provide education about signs/symptoms and action to take for hypo/hyperglycemia.;Provide education about proper nutrition, including hydration, and aerobic/resistive exercise prescription along with prescribed medications to achieve blood glucose in normal ranges: Fasting glucose 65-99 mg/dL    Expected Outcomes Short Term: Participant verbalizes  understanding of the signs/symptoms and immediate care of hyper/hypoglycemia, proper foot care and importance of medication, aerobic/resistive exercise and nutrition plan for blood glucose control.;Long Term: Attainment of HbA1C < 7%.    Heart Failure Yes    Intervention Provide a combined exercise and nutrition program that is supplemented with education, support and counseling about heart failure. Directed toward relieving symptoms such as shortness of breath, decreased exercise tolerance, and extremity edema.    Expected Outcomes Improve functional capacity of life;Short term: Attendance in program 2-3 days a week with increased exercise capacity. Reported lower sodium intake. Reported increased fruit and vegetable intake. Reports medication compliance.;Short term: Daily weights obtained and reported for increase. Utilizing diuretic protocols set by physician.;Long term: Adoption of self-care skills and reduction of barriers for early signs and symptoms recognition and intervention leading to self-care maintenance.    Hypertension Yes    Intervention Provide education on lifestyle modifcations including regular physical activity/exercise, weight management, moderate sodium restriction and increased consumption of fresh fruit, vegetables, and low fat dairy, alcohol moderation, and smoking cessation.;Monitor prescription use compliance.    Expected Outcomes Short Term: Continued assessment and intervention until BP is < 140/49mm HG in hypertensive participants. < 130/11mm HG in hypertensive participants with diabetes, heart failure or chronic kidney disease.;Long Term: Maintenance of blood pressure at goal levels.    Lipids Yes    Intervention Provide education and support for participant on nutrition & aerobic/resistive exercise along with prescribed medications to achieve LDL 70mg , HDL >40mg .    Expected Outcomes Short Term: Participant states understanding of desired cholesterol values and is compliant  with medications prescribed. Participant is following exercise prescription and nutrition guidelines.;Long Term: Cholesterol controlled with medications as prescribed, with individualized exercise RX and with personalized nutrition plan. Value goals: LDL < 70mg , HDL > 40 mg.          Core Components/Risk Factors/Patient Goals Review:   Goals and Risk Factor Review     Row Name 09/22/24 [phone]             Core Components/Risk Factors/Patient Goals Review   Personal Goals Review Improve shortness of breath with ADL's;Hypertension;Heart Failure;Lipids  Review [name] is doing well in rehab! Sometimes he has to miss due to work, but he is now coming 3 days a week! He states he checks his BP and weight every morning and has a log to document them in. He also takes all his medicines as prescribed.       Expected Outcomes Short: Continue to attend rehab. Long: Continue checking weight and vitals at home and report any abnormalities to MD.          Core Components/Risk Factors/Patient Goals at Discharge (Final Review):   Goals and Risk Factor Review - 09/22/24 0836       Core Components/Risk Factors/Patient Goals Review   Personal Goals Review Improve shortness of breath with ADL's;Hypertension;Heart Failure;Lipids    Review [name] is doing well in rehab! Sometimes he has to miss due to work, but he is now coming 3 days a week! He states he checks his BP and weight every morning and has a log to document them in. He also takes all his medicines as prescribed.    Expected Outcomes Short: Continue to attend rehab. Long: Continue checking weight and vitals at home and report any abnormalities to MD.          ITP Comments:  ITP Comments     Row Name 07/27/24 1314 08/02/24 0826 08/09/24 0752 08/18/24 0745 09/14/24 0833   ITP Comments Completed virtual orientation today.  EP evaluation is scheduled for 08/02/24 at 0800 .  Documentation for diagnosis can be found in [facility] encounter 07/23/24.  Patient attend orientation today.  Patient is attending Cardiac Rehabilitation Program.  Documentation for diagnosis can be found in [facility].  Reviewed medical chart, RPE/RPD, gym safety, and program guidelines.  Patient was fitted to equipment they will be using during rehab.  Patient is scheduled to start exercise on 08/09/24.   Initial ITP created and sent for review and signature by Dr. Dorn Ross, Medical Director for Cardiac Rehabilitation Program. First full day of exercise!  Patient was oriented to gym and equipment including functions, settings, policies, and procedures.  Patient's individual exercise prescription and treatment plan were reviewed.  All starting workloads were established based on the results of the 6 minute walk test done at initial orientation visit.  The plan for exercise progression was also introduced and progression will be customized based on patient's performance and goals. 30 day review completed. ITP sent to Dr. Dorn Ross, Medical Director of Cardiac Rehab. Continue with ITP unless changes are made by physician.  No goals due to patient being new in program. 30 day review completed. ITP sent to Dr. Dorn Ross, Medical Director of Cardiac Rehab. Continue with ITP unless changes are made by physician. Unable to get goals this round due to lack of attendance and admission.  He last attended on 08/25/24.    Row Name 10/12/24 1027           ITP Comments 30 day review completed. ITP sent to Dr. Dorn Ross, Medical Director of Cardiac Rehab. Continue with ITP unless changes are made by physician. Attendance has been intermitten due to medication adjustments.          Comments: 30 day review     [1]  Current Outpatient Medications:    aspirin  81 MG chewable tablet, Chew 1 tablet (81 mg total) by mouth daily., Disp: 90 tablet, Rfl: 3   atorvastatin  (LIPITOR) 10 MG tablet, Take 1 tablet (10 mg total) by mouth daily., Disp: 90 tablet, Rfl: 3  carvedilol   (COREG ) 3.125 MG tablet, Take 1 tablet (3.125 mg total) by mouth 2 (two) times daily with a meal., Disp: 180 tablet, Rfl: 3   Cholecalciferol (VITAMIN D3) 50 MCG (2000 UT) capsule, Take 2,000 Units by mouth daily., Disp: , Rfl:    empagliflozin  (JARDIANCE ) 10 MG TABS tablet, Take 1 tablet (10 mg total) by mouth daily., Disp: 30 tablet, Rfl: 11   glucosamine-chondroitin 500-400 MG tablet, Take 1 tablet by mouth in the morning and at bedtime., Disp: , Rfl:    metFORMIN (GLUCOPHAGE) 500 MG tablet, Take 500 mg by mouth in the morning and at bedtime., Disp: , Rfl:    methocarbamol (ROBAXIN) 500 MG tablet, Take 500 mg by mouth at bedtime as needed for muscle spasms (sleep)., Disp: , Rfl:    Multiple Vitamins-Minerals (CENTRUM SILVER 50+MEN) TABS, Take 1 tablet by mouth daily., Disp: , Rfl:    potassium chloride  SA (KLOR-CON  M) 20 MEQ tablet, Take 1 tablet (20 mEq total) by mouth 2 (two) times daily., Disp: , Rfl:    sacubitril -valsartan  (ENTRESTO ) 97-103 MG, Take 1 tablet by mouth 2 (two) times daily., Disp: 60 tablet, Rfl: 11   spironolactone  (ALDACTONE ) 25 MG tablet, Take 1 tablet (25 mg total) by mouth daily., Disp: 90 tablet, Rfl: 3   torsemide  (DEMADEX ) 20 MG tablet, Take 1 tablet (20 mg total) by mouth daily., Disp: , Rfl:  [2]  Social History Tobacco Use  Smoking Status Never  Smokeless Tobacco Never

## 2024-10-13 ENCOUNTER — Encounter (HOSPITAL_COMMUNITY)
Admission: RE | Admit: 2024-10-13 | Discharge: 2024-10-13 | Disposition: A | Source: Ambulatory Visit | Attending: Cardiology

## 2024-10-13 ENCOUNTER — Telehealth (HOSPITAL_COMMUNITY): Payer: Self-pay

## 2024-10-13 NOTE — Telephone Encounter (Signed)
 Called patient regarding missed cardiac rehab sessions. He continues to have bilateral lower extremity edema. Patient wants to cancel his appointments until he sees Dr. Zenaida 1/14. He said he would contact us  after his appointment.

## 2024-10-15 ENCOUNTER — Ambulatory Visit (HOSPITAL_COMMUNITY)

## 2024-10-15 ENCOUNTER — Telehealth (HOSPITAL_COMMUNITY): Payer: Self-pay | Admitting: Cardiology

## 2024-10-15 NOTE — Telephone Encounter (Signed)
 Patient called to request sooner appt for increased SOB  No details provided only wanted sooner appt  Add on 1/5 @ 12

## 2024-10-18 ENCOUNTER — Ambulatory Visit (HOSPITAL_COMMUNITY): Payer: Self-pay | Admitting: Cardiology

## 2024-10-18 ENCOUNTER — Ambulatory Visit (HOSPITAL_COMMUNITY): Admission: RE | Admit: 2024-10-18 | Discharge: 2024-10-18 | Attending: Cardiology | Admitting: Cardiology

## 2024-10-18 ENCOUNTER — Encounter (HOSPITAL_COMMUNITY): Payer: Self-pay

## 2024-10-18 ENCOUNTER — Emergency Department (HOSPITAL_COMMUNITY)

## 2024-10-18 ENCOUNTER — Encounter (HOSPITAL_COMMUNITY)

## 2024-10-18 ENCOUNTER — Inpatient Hospital Stay (HOSPITAL_COMMUNITY)
Admission: EM | Admit: 2024-10-18 | Discharge: 2024-10-26 | DRG: 275 | Disposition: A | Source: Home / Self Care | Attending: Cardiology | Admitting: Cardiology

## 2024-10-18 ENCOUNTER — Other Ambulatory Visit: Payer: Self-pay

## 2024-10-18 ENCOUNTER — Encounter (HOSPITAL_COMMUNITY): Payer: Self-pay | Admitting: Cardiology

## 2024-10-18 VITALS — BP 142/92 | HR 81 | Ht 71.0 in | Wt 233.0 lb

## 2024-10-18 DIAGNOSIS — I428 Other cardiomyopathies: Secondary | ICD-10-CM | POA: Diagnosis not present

## 2024-10-18 DIAGNOSIS — I130 Hypertensive heart and chronic kidney disease with heart failure and stage 1 through stage 4 chronic kidney disease, or unspecified chronic kidney disease: Principal | ICD-10-CM | POA: Diagnosis present

## 2024-10-18 DIAGNOSIS — Z79899 Other long term (current) drug therapy: Secondary | ICD-10-CM | POA: Diagnosis not present

## 2024-10-18 DIAGNOSIS — E1122 Type 2 diabetes mellitus with diabetic chronic kidney disease: Secondary | ICD-10-CM | POA: Diagnosis present

## 2024-10-18 DIAGNOSIS — I452 Bifascicular block: Secondary | ICD-10-CM | POA: Diagnosis present

## 2024-10-18 DIAGNOSIS — N179 Acute kidney failure, unspecified: Secondary | ICD-10-CM | POA: Diagnosis present

## 2024-10-18 DIAGNOSIS — I5082 Biventricular heart failure: Secondary | ICD-10-CM | POA: Diagnosis present

## 2024-10-18 DIAGNOSIS — E876 Hypokalemia: Secondary | ICD-10-CM | POA: Diagnosis present

## 2024-10-18 DIAGNOSIS — I5084 End stage heart failure: Secondary | ICD-10-CM | POA: Diagnosis present

## 2024-10-18 DIAGNOSIS — I420 Dilated cardiomyopathy: Secondary | ICD-10-CM | POA: Diagnosis present

## 2024-10-18 DIAGNOSIS — I5022 Chronic systolic (congestive) heart failure: Secondary | ICD-10-CM | POA: Insufficient documentation

## 2024-10-18 DIAGNOSIS — E7800 Pure hypercholesterolemia, unspecified: Secondary | ICD-10-CM | POA: Diagnosis present

## 2024-10-18 DIAGNOSIS — I2510 Atherosclerotic heart disease of native coronary artery without angina pectoris: Secondary | ICD-10-CM | POA: Diagnosis present

## 2024-10-18 DIAGNOSIS — E872 Acidosis, unspecified: Secondary | ICD-10-CM | POA: Diagnosis present

## 2024-10-18 DIAGNOSIS — I4510 Unspecified right bundle-branch block: Secondary | ICD-10-CM | POA: Diagnosis not present

## 2024-10-18 DIAGNOSIS — N1831 Chronic kidney disease, stage 3a: Secondary | ICD-10-CM | POA: Insufficient documentation

## 2024-10-18 DIAGNOSIS — I2721 Secondary pulmonary arterial hypertension: Secondary | ICD-10-CM | POA: Diagnosis present

## 2024-10-18 DIAGNOSIS — I493 Ventricular premature depolarization: Secondary | ICD-10-CM | POA: Diagnosis present

## 2024-10-18 DIAGNOSIS — D509 Iron deficiency anemia, unspecified: Secondary | ICD-10-CM | POA: Diagnosis present

## 2024-10-18 DIAGNOSIS — N1832 Chronic kidney disease, stage 3b: Secondary | ICD-10-CM | POA: Diagnosis present

## 2024-10-18 DIAGNOSIS — Z7982 Long term (current) use of aspirin: Secondary | ICD-10-CM | POA: Diagnosis not present

## 2024-10-18 DIAGNOSIS — Z7984 Long term (current) use of oral hypoglycemic drugs: Secondary | ICD-10-CM | POA: Diagnosis not present

## 2024-10-18 DIAGNOSIS — I472 Ventricular tachycardia, unspecified: Secondary | ICD-10-CM | POA: Diagnosis not present

## 2024-10-18 DIAGNOSIS — I509 Heart failure, unspecified: Secondary | ICD-10-CM | POA: Diagnosis not present

## 2024-10-18 DIAGNOSIS — I5023 Acute on chronic systolic (congestive) heart failure: Secondary | ICD-10-CM

## 2024-10-18 LAB — TROPONIN T, HIGH SENSITIVITY: Troponin T High Sensitivity: 67 ng/L — ABNORMAL HIGH (ref 0–19)

## 2024-10-18 LAB — URINALYSIS, ROUTINE W REFLEX MICROSCOPIC
Bilirubin Urine: NEGATIVE
Glucose, UA: 150 mg/dL — AB
Hgb urine dipstick: NEGATIVE
Ketones, ur: NEGATIVE mg/dL
Leukocytes,Ua: NEGATIVE
Nitrite: NEGATIVE
Protein, ur: 100 mg/dL — AB
Specific Gravity, Urine: 1.01 (ref 1.005–1.030)
pH: 5 (ref 5.0–8.0)

## 2024-10-18 LAB — CBC
HCT: 42.4 % (ref 39.0–52.0)
HCT: 43.5 % (ref 39.0–52.0)
Hemoglobin: 14.2 g/dL (ref 13.0–17.0)
Hemoglobin: 14.4 g/dL (ref 13.0–17.0)
MCH: 31.3 pg (ref 26.0–34.0)
MCH: 31.4 pg (ref 26.0–34.0)
MCHC: 33.5 g/dL (ref 30.0–36.0)
MCHC: 33.1 g/dL (ref 30.0–36.0)
MCV: 93.6 fL (ref 80.0–100.0)
MCV: 94.8 fL (ref 80.0–100.0)
Platelets: 123 K/uL — ABNORMAL LOW (ref 150–400)
Platelets: 148 K/uL — ABNORMAL LOW (ref 150–400)
RBC: 4.53 MIL/uL (ref 4.22–5.81)
RBC: 4.59 MIL/uL (ref 4.22–5.81)
RDW: 16.4 % — ABNORMAL HIGH (ref 11.5–15.5)
RDW: 16.4 % — ABNORMAL HIGH (ref 11.5–15.5)
WBC: 6.3 K/uL (ref 4.0–10.5)
WBC: 6.4 K/uL (ref 4.0–10.5)
nRBC: 0 % (ref 0.0–0.2)
nRBC: 0 % (ref 0.0–0.2)

## 2024-10-18 LAB — I-STAT CHEM 8, ED
BUN: 45 mg/dL — ABNORMAL HIGH (ref 8–23)
Calcium, Ion: 1.06 mmol/L — ABNORMAL LOW (ref 1.15–1.40)
Chloride: 99 mmol/L (ref 98–111)
Creatinine, Ser: 2.2 mg/dL — ABNORMAL HIGH (ref 0.61–1.24)
Glucose, Bld: 84 mg/dL (ref 70–99)
HCT: 45 % (ref 39.0–52.0)
Hemoglobin: 15.3 g/dL (ref 13.0–17.0)
Potassium: 3.2 mmol/L — ABNORMAL LOW (ref 3.5–5.1)
Sodium: 142 mmol/L (ref 135–145)
TCO2: 30 mmol/L (ref 22–32)

## 2024-10-18 LAB — COMPREHENSIVE METABOLIC PANEL WITH GFR
ALT: 26 U/L (ref 0–44)
AST: 34 U/L (ref 15–41)
Albumin: 4.2 g/dL (ref 3.5–5.0)
Alkaline Phosphatase: 88 U/L (ref 38–126)
Anion gap: 15 (ref 5–15)
BUN: 38 mg/dL — ABNORMAL HIGH (ref 8–23)
CO2: 30 mmol/L (ref 22–32)
Calcium: 9.5 mg/dL (ref 8.9–10.3)
Chloride: 99 mmol/L (ref 98–111)
Creatinine, Ser: 2.17 mg/dL — ABNORMAL HIGH (ref 0.61–1.24)
GFR, Estimated: 32 mL/min — ABNORMAL LOW
Glucose, Bld: 95 mg/dL (ref 70–99)
Potassium: 3.1 mmol/L — ABNORMAL LOW (ref 3.5–5.1)
Sodium: 144 mmol/L (ref 135–145)
Total Bilirubin: 1.8 mg/dL — ABNORMAL HIGH (ref 0.0–1.2)
Total Protein: 6.4 g/dL — ABNORMAL LOW (ref 6.5–8.1)

## 2024-10-18 LAB — PRO BRAIN NATRIURETIC PEPTIDE: Pro Brain Natriuretic Peptide: 19343 pg/mL — ABNORMAL HIGH

## 2024-10-18 LAB — CREATININE, SERUM
Creatinine, Ser: 2.18 mg/dL — ABNORMAL HIGH (ref 0.61–1.24)
GFR, Estimated: 32 mL/min — ABNORMAL LOW

## 2024-10-18 LAB — I-STAT CG4 LACTIC ACID, ED
Lactic Acid, Venous: 1.6 mmol/L (ref 0.5–1.9)
Lactic Acid, Venous: 2 mmol/L (ref 0.5–1.9)

## 2024-10-18 LAB — LIPASE, BLOOD: Lipase: 48 U/L (ref 11–51)

## 2024-10-18 LAB — MAGNESIUM: Magnesium: 1.7 mg/dL (ref 1.7–2.4)

## 2024-10-18 LAB — LACTIC ACID, PLASMA: Lactic Acid, Venous: 2 mmol/L (ref 0.5–1.9)

## 2024-10-18 MED ORDER — HEPARIN SODIUM (PORCINE) 5000 UNIT/ML IJ SOLN
5000.0000 [IU] | Freq: Three times a day (TID) | INTRAMUSCULAR | Status: DC
  Administered 2024-10-18 – 2024-10-26 (×23): 5000 [IU] via SUBCUTANEOUS
  Filled 2024-10-18 (×23): qty 1

## 2024-10-18 MED ORDER — ATORVASTATIN CALCIUM 10 MG PO TABS
10.0000 mg | ORAL_TABLET | Freq: Every day | ORAL | Status: DC
  Administered 2024-10-19 – 2024-10-26 (×8): 10 mg via ORAL
  Filled 2024-10-18 (×8): qty 1

## 2024-10-18 MED ORDER — SODIUM CHLORIDE 0.9% FLUSH
10.0000 mL | Freq: Two times a day (BID) | INTRAVENOUS | Status: DC
  Administered 2024-10-19 – 2024-10-20 (×3): 10 mL
  Administered 2024-10-20: 30 mL
  Administered 2024-10-21 – 2024-10-26 (×7): 10 mL

## 2024-10-18 MED ORDER — ACETAMINOPHEN 325 MG PO TABS
650.0000 mg | ORAL_TABLET | ORAL | Status: DC | PRN
  Administered 2024-10-19 – 2024-10-26 (×11): 650 mg via ORAL
  Filled 2024-10-18 (×11): qty 2

## 2024-10-18 MED ORDER — FUROSEMIDE 10 MG/ML IJ SOLN
120.0000 mg | Freq: Two times a day (BID) | INTRAVENOUS | Status: DC
  Administered 2024-10-18 – 2024-10-19 (×2): 120 mg via INTRAVENOUS
  Filled 2024-10-18 (×4): qty 12
  Filled 2024-10-18 (×2): qty 120
  Filled 2024-10-18: qty 12

## 2024-10-18 MED ORDER — FUROSEMIDE 10 MG/ML IJ SOLN
120.0000 mg | Freq: Once | INTRAVENOUS | Status: AC
  Administered 2024-10-18: 120 mg via INTRAVENOUS
  Filled 2024-10-18: qty 120

## 2024-10-18 MED ORDER — ONDANSETRON HCL 4 MG/2ML IJ SOLN: 4.0000 mg | Freq: Four times a day (QID) | INTRAMUSCULAR | Status: DC | PRN

## 2024-10-18 MED ORDER — SACUBITRIL-VALSARTAN 49-51 MG PO TABS
1.0000 | ORAL_TABLET | Freq: Two times a day (BID) | ORAL | Status: DC
  Administered 2024-10-18 – 2024-10-19 (×3): 1 via ORAL
  Filled 2024-10-18 (×4): qty 1

## 2024-10-18 MED ORDER — SODIUM CHLORIDE 0.9% FLUSH: 3.0000 mL | INTRAVENOUS | Status: DC | PRN

## 2024-10-18 MED ORDER — CHLORHEXIDINE GLUCONATE CLOTH 2 % EX PADS
6.0000 | MEDICATED_PAD | Freq: Every day | CUTANEOUS | Status: DC
  Administered 2024-10-19 – 2024-10-26 (×8): 6 via TOPICAL

## 2024-10-18 MED ORDER — ASPIRIN 81 MG PO CHEW: 81.0000 mg | CHEWABLE_TABLET | Freq: Every day | ORAL | Status: DC

## 2024-10-18 MED ORDER — SPIRONOLACTONE 25 MG PO TABS
25.0000 mg | ORAL_TABLET | Freq: Every day | ORAL | Status: DC
  Administered 2024-10-19 – 2024-10-21 (×3): 25 mg via ORAL
  Filled 2024-10-18 (×3): qty 1

## 2024-10-18 MED ORDER — POTASSIUM CHLORIDE CRYS ER 20 MEQ PO TBCR
40.0000 meq | EXTENDED_RELEASE_TABLET | ORAL | Status: AC
  Administered 2024-10-18 (×2): 40 meq via ORAL
  Filled 2024-10-18 (×2): qty 2

## 2024-10-18 MED ORDER — EMPAGLIFLOZIN 10 MG PO TABS: 10.0000 mg | ORAL_TABLET | Freq: Every day | ORAL | Status: DC

## 2024-10-18 MED ORDER — SODIUM CHLORIDE 0.9% FLUSH: 10.0000 mL | INTRAVENOUS | Status: DC | PRN

## 2024-10-18 MED ORDER — MAGNESIUM SULFATE 2 GM/50ML IV SOLN
2.0000 g | Freq: Once | INTRAVENOUS | Status: AC
  Administered 2024-10-18: 2 g via INTRAVENOUS
  Filled 2024-10-18: qty 50

## 2024-10-18 MED ORDER — SODIUM CHLORIDE 0.9% FLUSH
3.0000 mL | Freq: Two times a day (BID) | INTRAVENOUS | Status: DC
  Administered 2024-10-19 – 2024-10-25 (×11): 3 mL via INTRAVENOUS

## 2024-10-18 MED ORDER — SODIUM CHLORIDE 0.9 % IV SOLN: 250.0000 mL | INTRAVENOUS | Status: AC | PRN

## 2024-10-18 NOTE — Progress Notes (Signed)
 "  ADVANCED HEART FAILURE FOLLOW UP CLINIC NOTE  Primary Care: Jonathan Slater, MD Primary Cardiologist: Dr. Zenaida  HPI: Jonathan Fowler is a 69 y.o. male who presents for follow up of chronic systolic heart failure.      Recent diagnosis of acute systolic heart failure 9/25 during admission. Took BP meds at home PTA and reported full compliance with them. Echo showed EF <20%, AHF consulted.  Underwent RHC 07/02/2024 with severely elevated biventricular filling pressures and reduced cardiac index, LHC deferred with elevated renal function. Diuresed well with IV lasix , renal function improved. cMRI with NiCM BiV HF, LVEF 15%. As renal function improved, underwent [facility] 07/05/24 with nonobs CAD, elevated filling pressures and preserved CO. Required further diuresis and GDMT titrated. He was discharged home, weight 213 lbs.   Repeat echo 12/25 again showed EF <20%, biplan 18.4%, G3DD, severely reduced RV function, BAE     SUBJECTIVE:  Patient recently had his diuretics increased at the beginning of last week.  He reports that for the past 4 to 5 days he has been feeling very poorly, more short of breath and fatigued, barely able to do anything.  He decreased his diuretics to 20 mg daily and is feeling better today.  Reports that he is breathing easier at night and is hopeful to have his BiPAP ready soon.  Briefly discussed with monitoring strategy such as CardioMEMS, given that he is feeling better he would like to defer at this time.  Blood pressure overall at home is usually in the 120s, has been mildly elevated this past week.  He returns today for HF follow up with wife. Overall feeling poorly. NYHA IV. Reports dyspnea, bloating, nausea, coolness in extremities. Denies chest pain, palpitations, dizziness, and abnormal bleeding. Able to perform ADLs. Appetite okay. Weight up 13 lbs since last appointment. Compliant with all medications.Compliant with BiPAP.   PHYSICAL EXAM:  Vitals:    10/18/24 1159  BP: (!) 142/92  Pulse: 81  SpO2: 94%   Filed Weights   10/18/24 1159  Weight: 105.7 kg (233 lb)   General: Jaundice, [name]  Cardiac: JVP ~10 cm. S3 present. 2/6 apical murmur Resp: Lung sounds clear and equal B/L Abdomen: Soft, distended.  Extremities: Warm and dry.  2+ BLE R>L edema.  Neuro: A&O x3. Affect pleasant.    DATA REVIEW  ECG: NSR 83 bpm, RBBB, QRS 203 ms  ECHO: 12/25: Unchanged. Severe BiV HF. EF <20%, G3DD, severely reduce RV function, BAE 11/25: Severe biventricular failure, EF l<20%, severely reduced RV systolic function, no valvular disease 9/25: LVEF <20%, grade 2 diastolic dysfunction, RV moderately reduced  CATH: 9/25: Nonobstructive CAD, RA 11, PA 67/31 (42), PCWP 21, compensated assumed Fick CO/CI  CMR: 9/25: LVEF 16%, RVEF 29%, no evidence of infiltrative disease, nonischemic cardiomyopathy  CPX:  11/25: mod to severe HF limitations with markedly elevated slope suggestive of high risk  ASSESSMENT & PLAN:  Chronic systolic heart failure: Severely reduced biventricular function, still quite symptomatic. Etiology uncertain.   Repeat echo 12/25 without improvement despite excellent dosing of GDMT. CPX testing with moderate to severe HF limitations. Worsening NYHA class IV. Unfortunately his creatinine trend is concerning and between that and his biventricular failure limits candidacy for LVAD. Has a small window for potential H/K. Concerned about clinical trajectory. Concerned for low output on exam and by symptoms. Severely hypervolemic with 15 lbs weight gain not responding to PO diuretics.  - Hold ? blocker on admission - Hold  jardiance  with anticipation of RHC - Hold other GDMT pending BMET and renal function - Recommend diuresis with IV Lasix  120 mg bid; if does not respond appropriately; may need inotrope to assist diuresis - Needs RHC - Check lactic acid - Previously dicussed CRT-D placement and watchful  waiting. However with low output suspect that he will continue to decline while waiting. Has narrow window for H/K. Would not be LVAD candidate with severe RV dysfunction and worsening renal function. However renal function acutely worsened in the last few months after HF diagnosis, ?if it will improve with increased cardiac output.   Hypertension: - Elevated in visit today  CKD IIIa - Followed by Dr Jonathan Fowler.  - Baseline SCr 1.4-1.7 - Recent significant decline in renal function since HF diagnosis. Suspect primarily cardio-renal syndrome as above.   RBBB - QRS 206 ms on ECG today  Discussed with Dr. Zenaida. Will send to ED for inpatient evaluation by HF team.  Jonathan Woolford, NP 10/18/2024 "

## 2024-10-18 NOTE — Patient Instructions (Signed)
 PLEASE REPORT TO THE EMERGENCY ROOM FOR EVALUATION   Follow-Up in: AS SCHEDULED   At the [facility], you and your health needs are our priority. We have a designated team specialized in the treatment of Heart Failure. This Care Team includes your primary Heart Failure Specialized Cardiologist (physician), Advanced Practice Providers (APPs- Physician Assistants and Nurse Practitioners), and Pharmacist who all work together to provide you with the care you need, when you need it.   You may see any of the following providers on your designated Care Team at your next follow up:  Dr. Toribio Fuel Dr. Ezra Shuck Dr. Odis Brownie Greig Mosses, NP Caffie Shed, [place] [facility] [place], [place] Beckey Coe, NP Jordan Lee, NP Tinnie Redman, PharmD   Please be sure to bring in all your medications bottles to every appointment.   Need to Contact Us :  If you have any questions or concerns before your next appointment please send us  a message through [facility] or call our office at [phone].    TO LEAVE A MESSAGE FOR THE NURSE SELECT OPTION 2, PLEASE LEAVE A MESSAGE INCLUDING: YOUR NAME DATE OF BIRTH CALL BACK NUMBER REASON FOR CALL**this is important as we prioritize the call backs  YOU WILL RECEIVE A CALL BACK THE SAME DAY AS LONG AS YOU CALL BEFORE 4:00 PM

## 2024-10-18 NOTE — ED Provider Triage Note (Signed)
 Emergency Medicine Provider Triage Evaluation Note  Jonathan Fowler , a 69 y.o. male  was evaluated in triage.  Pt complains of bilateral leg swelling, abdominal swelling with accompanied shortness of breath  since sept but has been acutely worsening x 1 week. Has been taking medications faithfully, including torsemide . Seen by cardiology this morning, and told that they will be admitting him, reportedly told by Dr. Zenaida.   Heart cath in September.   Endorses white sputum in cough, watery diarrhea x 1 week, additionally noting to have periumbilical pain worse with eating  Denies fever, body aches, congestion, n/v/.   Review of Systems  Positive: N/a Negative: N/a  Physical Exam  BP (!) 148/112   Pulse 86   Temp 98.4 F (36.9 C)   Resp 18   SpO2 96%  Gen:   Awake, no distress   Resp:  Normal effort  MSK:   Moves extremities without difficulty  Other:    Medical Decision Making  Medically screening exam initiated at 2:08 PM.  Appropriate orders placed.  [place] CHRISTELLA Bascom was informed that the remainder of the evaluation will be completed by another provider, this initial triage assessment does not replace that evaluation, and the importance of remaining in the ED until their evaluation is complete.     Beola Terrall RAMAN, [place] 10/18/24 1415

## 2024-10-18 NOTE — ED Triage Notes (Signed)
 Pt states he is more SOB with exertion, takes his medications religiously. States he is having swelling in legs and abdomen.

## 2024-10-18 NOTE — Progress Notes (Signed)
 Peripherally Inserted Central Catheter Placement  The IV Nurse has discussed with the patient and/or persons authorized to consent for the patient, the purpose of this procedure and the potential benefits and risks involved with this procedure.  The benefits include less needle sticks, lab draws from the catheter, and the patient may be discharged home with the catheter. Risks include, but not limited to, infection, bleeding, blood clot (thrombus formation), and puncture of an artery; nerve damage and irregular heartbeat and possibility to perform a PICC exchange if needed/ordered by physician.  Alternatives to this procedure were also discussed.  Bard Power PICC patient education guide, fact sheet on infection prevention and patient information card has been provided to patient /or left at bedside.    PICC Placement Documentation  PICC Double Lumen 10/18/24 Right Basilic 42 cm 1 cm (Active)  Indication for Insertion or Continuance of Line Vasoactive infusions;Chronic illness with exacerbations (CF, Sickle Cell, etc.) 10/18/24 1920  Exposed Catheter (cm) 1 cm 10/18/24 1920  Site Assessment Clean, Dry, Intact 10/18/24 1920  Lumen #1 Status Flushed;Saline locked;Blood return noted 10/18/24 1920  Lumen #2 Status Flushed;Saline locked;Blood return noted 10/18/24 1920  Dressing Type Transparent;Securing device 10/18/24 1920  Dressing Status Antimicrobial disc/dressing in place;Clean, Dry, Intact 10/18/24 1920  Line Care Connections checked and tightened 10/18/24 1920  Line Adjustment (NICU/IV Team Only) No 10/18/24 1920  Dressing Intervention New dressing;Adhesive placed at insertion site (IV team only) 10/18/24 1920  Dressing Change Due 10/25/24 10/18/24 1920       [name] 10/18/2024, 7:21 PM

## 2024-10-18 NOTE — H&P (Addendum)
 "   Advanced Heart Failure Team History and Physical Note   PCP:  Koirala, Dibas, MD  PCP-Cardiology: None     Reason for Admission: Acute on chronic biventricular heart failure HPI:    Jonathan Fowler is a 69 y.o. male with history of acute on chronic biventricular heart failure, HTN and CKD III.  He was diagnosed with heart failure during admission in 09/25. Echo showed EF <20%, AHF consulted.  RHC 07/02/2024 with severely elevated biventricular filling pressures and reduced cardiac index. cMRI with NiCM BiV HF, LVEF 15%. As renal function improved, underwent [facility] 07/05/24 with nonobs CAD, elevated filling pressures and preserved CO. Required further diuresis and GDMT titrated. He was discharged home, weight 213 lbs.    Repeat echo 12/25 again showed EF <20%, biplan 18.4%, G3DD, severely reduced RV function, BAE  Seen in Advanced Heart Failure Clinic today. Overall feeling poorly. NYHA IV. Reports dyspnea, bloating, nausea, coolness in extremities. Appetite not great. Able to perform ADLs but must stop to take breaks. Clinic weight up 13 lbs since last appointment. Compliant with all medications and BiPAP. He was sent to the ED for admission d/t concern for low-output HF.   Home Medications Prior to Admission medications  Medication Sig Start Date End Date Taking? Authorizing Provider  aspirin  81 MG chewable tablet Chew 1 tablet (81 mg total) by mouth daily. 07/14/24   Hayes Beckey CROME, NP  atorvastatin  (LIPITOR) 10 MG tablet Take 1 tablet (10 mg total) by mouth daily. 07/14/24   Hayes Beckey CROME, NP  carvedilol  (COREG ) 3.125 MG tablet Take 1 tablet (3.125 mg total) by mouth 2 (two) times daily with a meal. 07/14/24   Hayes Beckey CROME, NP  Cholecalciferol (VITAMIN D3) 50 MCG (2000 UT) capsule Take 2,000 Units by mouth daily.    [provider]  empagliflozin  (JARDIANCE ) 10 MG TABS tablet Take 1 tablet (10 mg total) by mouth daily. 07/14/24   Hayes Beckey CROME, NP  glucosamine-chondroitin 500-400 MG  tablet Take 1 tablet by mouth in the morning and at bedtime.    [provider]  metFORMIN (GLUCOPHAGE) 500 MG tablet Take 500 mg by mouth in the morning and at bedtime. 07/21/23   [provider]  methocarbamol (ROBAXIN) 500 MG tablet Take 500 mg by mouth at bedtime as needed for muscle spasms (sleep). 06/17/24   [provider]  Multiple Vitamins-Minerals (CENTRUM SILVER 50+MEN) TABS Take 1 tablet by mouth daily.    [provider]  potassium chloride  SA (KLOR-CON  M) 20 MEQ tablet Take 1 tablet (20 mEq total) by mouth 2 (two) times daily. 09/27/24   Zenaida Morene PARAS, MD  sacubitril -valsartan  (ENTRESTO ) 97-103 MG Take 1 tablet by mouth 2 (two) times daily. 07/14/24   Hayes Beckey CROME, NP  spironolactone  (ALDACTONE ) 25 MG tablet Take 1 tablet (25 mg total) by mouth daily. 07/14/24   Hayes Beckey CROME, NP  torsemide  (DEMADEX ) 20 MG tablet Take 1 tablet (20 mg total) by mouth daily. 09/27/24   Zenaida Morene PARAS, MD    Past Medical History: Past Medical History:  Diagnosis Date   High cholesterol    Hypertension    Vertigo     Past Surgical History: Past Surgical History:  Procedure Laterality Date   RIGHT HEART CATH N/A 07/02/2024   Procedure: RIGHT HEART CATH;  Surgeon: Gardenia Led, DO;  Location: [facility] INVASIVE CV LAB;  Service: Cardiovascular;  Laterality: N/A;   RIGHT/LEFT HEART CATH AND CORONARY ANGIOGRAPHY N/A 07/05/2024  Procedure: RIGHT/LEFT HEART CATH AND CORONARY ANGIOGRAPHY;  Surgeon: Rolan Ezra RAMAN, MD;  Location: [facility] INVASIVE CV LAB;  Service: Cardiovascular;  Laterality: N/A;    Family History:  History reviewed. No pertinent family history.  Social History: Social History   Socioeconomic History   Marital status: Married    Spouse name: Not on file   Number of children: Not on file   Years of education: Not on file   Highest education level: Not on file  Occupational History   Not on file  Tobacco Use   Smoking status: Never    Smokeless tobacco: Never  Substance and Sexual Activity   Alcohol use: Yes    Comment: socially   Drug use: No   Sexual activity: Not on file  Other Topics Concern   Not on file  Social History Narrative   Not on file   Social Drivers of Health   Tobacco Use: Low Risk (10/18/2024)   Patient History    Smoking Tobacco Use: Never    Smokeless Tobacco Use: Never    Passive Exposure: Not on file  Financial Resource Strain: Not on file  Food Insecurity: No Food Insecurity (06/30/2024)   Epic    Worried About Programme Researcher, Broadcasting/film/video in the Last Year: Never true    Ran Out of Food in the Last Year: Never true  Transportation Needs: No Transportation Needs (06/30/2024)   Epic    Lack of Transportation (Medical): No    Lack of Transportation (Non-Medical): No  Physical Activity: Not on file  Stress: Not on file  Social Connections: Unknown (06/30/2024)   Social Connection and Isolation Panel    Frequency of Communication with Friends and Family: Never    Frequency of Social Gatherings with Friends and Family: Never    Attends Religious Services: Never    Database Administrator or Organizations: Patient declined    Attends Banker Meetings: Patient declined    Marital Status: Married  Depression (PHQ2-9): Low Risk (08/02/2024)   Depression (PHQ2-9)    PHQ-2 Score: 2  Alcohol Screen: Not on file  Housing: Low Risk (06/30/2024)   Epic    Unable to Pay for Housing in the Last Year: No    Number of Times Moved in the Last Year: 0    Homeless in the Last Year: No  Utilities: Not At Risk (06/30/2024)   Epic    Threatened with loss of utilities: No  Health Literacy: Not on file    Allergies:  Allergies[1]  Objective:    Vital Signs:   Temp:  [98.4 F (36.9 C)] 98.4 F (36.9 C) (01/05 1244) Pulse Rate:  [86] 86 (01/05 1244) Resp:  [18] 18 (01/05 1244) BP: (148)/(112) 148/112 (01/05 1244) SpO2:  [96 %-100 %] 100 % (01/05 1547)   There were no vitals filed for this  visit.  Physical Exam    General:  Fatigued appearing.   Cor: JVP to jaw. Regular rate & rhythm. No murmurs.  Lungs: clear Extremities: 1+ on R, trace on L  Telemetry   Not currently hooked up  EKG   SR 84 bpm, RBBB with QRS > 200 ms  Labs  Basic Metabolic Panel: Recent Labs  Lab 10/18/24 1439  NA 144  K 3.1*  CL 99  CO2 30  GLUCOSE 95  BUN 38*  CREATININE 2.17*  CALCIUM  9.5  MG 1.7    Liver Function Tests: Recent Labs  Lab 10/18/24 1439  AST 34  ALT 26  ALKPHOS 88  BILITOT 1.8*  PROT 6.4*  ALBUMIN 4.2   Recent Labs  Lab 10/18/24 1439  LIPASE 48   No results for input(s): AMMONIA in the last 168 hours.  CBC: Recent Labs  Lab 10/18/24 1439  WBC 6.3  HGB 14.2  HCT 42.4  MCV 93.6  PLT 123*    Cardiac Enzymes: No results for input(s): CKTOTAL, CKMB, CKMBINDEX, TROPONINI in the last 168 hours.  BNP: BNP (last 3 results) Recent Labs    06/29/24 1412 08/30/24 1923 09/27/24 0953  BNP 1,746.1* >4,500.0* >4,500.0*    ProBNP (last 3 results) Recent Labs    10/18/24 1439  PROBNP 19,343.0*     CBG: No results for input(s): GLUCAP in the last 168 hours.  Coagulation Studies: No results for input(s): LABPROT, INR in the last 72 hours.  Imaging: DG Chest 2 View Result Date: 10/18/2024 EXAM: 2 VIEW(S) XRAY OF THE CHEST 10/18/2024 02:48:00 PM COMPARISON: 08/30/2024 CLINICAL HISTORY: SOB FINDINGS: LUNGS AND PLEURA: Mild right hemidiaphragm elevation. Mild pulmonary interstitial prominence. No focal pulmonary opacity. No pleural effusion. No pneumothorax. HEART AND MEDIASTINUM: Mild cardiomegaly. No acute abnormality of the mediastinal silhouette. BONES AND SOFT TISSUES: Right shoulder prosthesis noted. No acute osseous abnormality. IMPRESSION: 1. Mild cardiomegaly and pulmonary interstitial prominence, favoring pulmonary venous congestion. No overt congestive heart failure. Electronically signed by: Rockey Kilts MD 10/18/2024 03:35  PM EST RP Workstation: [id number]    Patient Profile   Jonathan Fowler is a 69 y.o. male with history of biventricular systolic heart failure, HTN, CKD III. Presenting with acute on chronic CHF with concern for low-output.  Assessment/Plan   Chronic systolic heart failure:  - Severely reduced biventricular function - Etiology uncertain.  - R/LHC 9/25: Elevated filling pressures, preserved CO, moderate mixed pulmonary venous/pulmonary arterial hypertension, nonobstructive CAD - cMRI 9/25: LVEF 16%, RVEF 29%, nonspecific LGE, no evidence of infiltrative disease - Repeat echo 12/25 without improvement despite excellent dosing of GDMT.  - CPX testing with moderate to severe HF limitation, VE/VCO2 slope 47, peak VO2 14.   - NYHA IV. Concerned for low output on exam and by symptoms.  - Lactic acid pending - Place PICC to monitor CVP and Co-ox - Start IV lasix  120 BID  - Hold ? blocker w/ concern for low-output - Continue entresto  but at lower dose 49/51 mg BID - Continue spiro 25 mg daily - Continue jardiance  10 mg daily - Recommend diuresis with IV Lasix  120 mg bid; if does not respond appropriately; may need inotrope to assist diuresis - Will likely need RHC sometime this week - Previously dicussed CRT-D placement and watchful waiting. However with low output suspect that he will continue to decline while waiting. Has narrow window for H/K. Would not be LVAD candidate with severe RV dysfunction and worsening renal function. However renal function acutely worsened in the last few months after HF diagnosis, ?if it will improve with increased cardiac output.    Hypertension: - BP elevated at today's visit   CKD IIIa - Followed by Dr Tobie.  - Baseline SCr previously 1.4-1.7. Above 2 recently. 2.1 today - Recent significant decline in renal function since HF diagnosis. Suspect primarily cardio-renal syndrome as above.    RBBB - QRS 206 ms on ECG today  FINCH, LINDSAY N,  PA-C 10/18/2024, 3:58 PM  Advanced Heart Failure Team Pager [phone] (M-F; 7a - 5p)   Please visit [email or web address]: For overnight coverage please call cardiology fellow first. If fellow  not available call Shock/ECMO MD on call.  For ECMO / Mechanical Support (Impella, IABP, LVAD) issues call Shock / ECMO MD on call.    Patient seen with PA, I formulated the plan and agree with the above note.   Patient has history of nonischemic cardiomyopathy, concern for developing end stage biventricular failure.  He reports gradual worsening, now with NYHA class IIIb symptoms, short of breath walking around house, early satiety, orthopnea.  Weight is up.  Patient was seen in clinic today and directed to ER for admission.   Creatinine 2.17, which seems to be his current baseline.  Pro-BNP 19343. He is in NSR with wide RBBB.   General: NAD Neck: JVP 16 cm, no thyromegaly or thyroid  nodule.  Lungs: Clear to auscultation bilaterally with normal respiratory effort. CV: Nondisplaced PMI.  Heart regular S1/S2 with widely split S2, no murmur.  1+ edema to knees.  No carotid bruit.  Normal pedal pulses.  Abdomen: Soft, nontender, no hepatosplenomegaly, mild distention.  Skin: Intact without lesions or rashes.  Neurologic: Alert and oriented x 3.  Psych: Normal affect. Extremities: No clubbing or cyanosis.  HEENT: Normal.   1. Acute on chronic systolic CHF: Biventricular failure, suspect nearing end stage.  NICM.  Cath 9/25 with nonobstructive CAD and preserved CI.  cMRI 9/25 with LV EF 16%, RV EF 19%, basal septal LGE (nonspecific, seen with dilated CMP).  Echo in 12/25 with EF 18%, severe LV dilation, severe RV dysfunction, severe biatrial enlargement, IVC dilated. CPX with moderate-severe functional impairment due to HF. NYHA class IIIb.  On exam today, quite significantly volume overloaded.  BP elevated but pulse pressure narrow. He has been taking torsemide  40 qam/20 qpm at home.  Situation complicated by CKD stage  3, creatinine 2.17 today.  - Check lactate now.  - Place PICC to follow CVP and co-ox.  - If lactate high or co-ox low, will start him on milrinone  0.25.  - Lasix  120 mg IV bid.  - Hold Coreg .  - Continue Entresto  for now at lower dose 49/51 bid until we see which way creatinine trends.  - Continue spironolactone  25 daily. - Continue Jardiance .  - I worry for developing end stage biventricular cardiomyopathy.  I do not think that he is going to be a very good LVAD candidate with significant RV failure and elevated creatinine.  Heart/kidney transplant would be a consideration but he will have a narrow window for this.  - Will need eventual RHC.  - He has a wide RBBB, probably not going to get marked improvement from CRT.  2. CKD stage 3: Creatinine 2.17, this seems to be around his baseline for at least the last month.  Suspect cardiorenal syndrome.  - May improve with inotrope, will start if lactate high or co-ox low.  3. DM2: SSI.  4. HTN: Meds as above.    Ezra Shuck 10/18/2024 4:44 PM      [1]  Allergies Allergen Reactions   Loratadine     Headache    Lisinopril Cough   Mobic [Meloxicam] Nausea And Vomiting   Penicillins Nausea Only and Rash    Has patient had a PCN reaction causing immediate rash, facial/tongue/throat swelling, SOB or lightheadedness with hypotension: No Has patient had a PCN reaction causing severe rash involving mucus membranes or skin necrosis: No Has patient had a PCN reaction that required hospitalization: No Has patient had a PCN reaction occurring within the last 10 years: No If all of the above answers  are NO, then may proceed with Cephalosporin use.   "

## 2024-10-18 NOTE — ED Provider Notes (Signed)
 " [place] [facility] Provider Note  CSN: [id number] Arrival date & time: 10/18/24 1240  Chief Complaint(s) Fluid Retention and Shortness of Breath  HPI Jonathan Fowler is a 69 y.o. male with past medical history as below, significant for hyperlipidemia, hypertension, vertigo, obesity, systolic heart failure, CKD who presents to the ED with complaint of fluid overload, dyspnea   Sent from outpatient clinic secondary to worsening peripheral edema, increased exertional dyspnea despite increased and home diuretic.  Creased fatigue with minimal exertion.  Increased dyspnea with exertion.  Weight gain over the past couple weeks.  Sent by heart failure clinic for admission setting of heart failure exacerbation  Patient reports he has been gaining weight over the past few weeks, has been feeling progressively worse since he was he was admitted back in November.  He has been taking his Lasix  religiously without any missed doses.  He has been following his fluid restriction diet as well.  Having cough intermittently productive with white or clear sputum.  No fevers or chills.  No chest pain.  Having exertional dyspnea and orthopnea.  He has been using his BiPAP nightly over the past 2 weeks and has been tolerating well.  Feels like he is resting much better with his BiPAP.  Follows with Dr. Zenaida MOCCASIN  Past Medical History Past Medical History:  Diagnosis Date   High cholesterol    Hypertension    Vertigo    Patient Active Problem List   Diagnosis Date Noted   Acute on chronic systolic heart failure (HCC) 10/18/2024   Iron  deficiency anemia 07/02/2024   Essential hypertension 06/30/2024   Obesity, class 1 06/30/2024   Acute on chronic systolic CHF (congestive heart failure) (HCC) 06/29/2024   Chronic kidney disease, stage 3a (HCC) 06/29/2024   RBBB 06/29/2024   QT prolongation 06/29/2024   Type 2 diabetes mellitus with hyperlipidemia (HCC) 06/29/2024    Olecranon bursitis of left elbow 08/25/2017   Home Medication(s) Prior to Admission medications  Medication Sig Start Date End Date Taking? Authorizing Provider  aspirin  81 MG chewable tablet Chew 1 tablet (81 mg total) by mouth daily. 07/14/24  Yes Hayes Beckey CROME, NP  atorvastatin  (LIPITOR) 10 MG tablet Take 1 tablet (10 mg total) by mouth daily. 07/14/24  Yes Hayes Beckey CROME, NP  carvedilol  (COREG ) 3.125 MG tablet Take 1 tablet (3.125 mg total) by mouth 2 (two) times daily with a meal. 07/14/24  Yes Hayes Beckey CROME, NP  Cholecalciferol (VITAMIN D3) 50 MCG (2000 UT) capsule Take 2,000 Units by mouth daily.   Yes [provider]  empagliflozin  (JARDIANCE ) 10 MG TABS tablet Take 1 tablet (10 mg total) by mouth daily. 07/14/24  Yes Hayes Beckey CROME, NP  glucosamine-chondroitin 500-400 MG tablet Take 1 tablet by mouth in the morning and at bedtime.   Yes [provider]  metFORMIN (GLUCOPHAGE) 500 MG tablet Take 500 mg by mouth in the morning and at bedtime. 07/21/23  Yes [provider]  Multiple Vitamins-Minerals (CENTRUM SILVER 50+MEN) TABS Take 1 tablet by mouth daily.   Yes [provider]  potassium chloride  SA (KLOR-CON  M) 20 MEQ tablet Take 1 tablet (20 mEq total) by mouth 2 (two) times daily. 09/27/24  Yes Zenaida Morene PARAS, MD  sacubitril -valsartan  (ENTRESTO ) 97-103 MG Take 1 tablet by mouth 2 (two) times daily. 07/14/24  Yes Hayes Beckey CROME, NP  spironolactone  (ALDACTONE ) 25 MG tablet Take 1 tablet (25 mg total) by mouth daily. 07/14/24  Yes Hayes Beckey CROME, NP  torsemide  (DEMADEX ) 20 MG tablet Take 1 tablet (20 mg total) by mouth daily. Patient taking differently: Take 20-40 mg by mouth See admin instructions. Takes 2 in the AM and 1 at night. 09/27/24  Yes Zenaida Morene PARAS, MD  methocarbamol (ROBAXIN) 500 MG tablet Take 500 mg by mouth at bedtime as needed for muscle spasms (sleep). Patient not taking: Reported on 10/18/2024 06/17/24   [provider]                                                                                                                                     Past Surgical History Past Surgical History:  Procedure Laterality Date   RIGHT HEART CATH N/A 07/02/2024   Procedure: RIGHT HEART CATH;  Surgeon: Gardenia Led, DO;  Location: [facility] INVASIVE CV LAB;  Service: Cardiovascular;  Laterality: N/A;   RIGHT/LEFT HEART CATH AND CORONARY ANGIOGRAPHY N/A 07/05/2024   Procedure: RIGHT/LEFT HEART CATH AND CORONARY ANGIOGRAPHY;  Surgeon: Rolan Ezra RAMAN, MD;  Location: [facility] INVASIVE CV LAB;  Service: Cardiovascular;  Laterality: N/A;   Family History History reviewed. No pertinent family history.  Social History Social History[1] Allergies Loratadine, Lisinopril, Mobic [meloxicam], and Penicillins  Review of Systems A thorough review of systems was obtained and all systems are negative except as noted in the HPI and PMH.   Physical Exam Vital Signs  I have reviewed the triage vital signs BP (!) 148/112   Pulse 86   Temp 98.4 F (36.9 C)   Resp 18   SpO2 100%  Physical Exam Vitals and nursing note reviewed.  Constitutional:      General: He is not in acute distress.    Appearance: He is well-developed.  HENT:     Head: Normocephalic and atraumatic.     Right Ear: External ear normal.     Left Ear: External ear normal.     Mouth/Throat:     Mouth: Mucous membranes are moist.  Eyes:     General: No scleral icterus. Cardiovascular:     Rate and Rhythm: Normal rate and regular rhythm.     Pulses: Normal pulses.     Heart sounds: Normal heart sounds.  Pulmonary:     Effort: Pulmonary effort is normal. No respiratory distress.     Breath sounds: Decreased breath sounds present.  Abdominal:     General: Abdomen is flat.     Palpations: Abdomen is soft.     Tenderness: There is no abdominal tenderness.  Musculoskeletal:     Cervical back: No rigidity.     Right lower leg: Edema present.     Left lower leg: Edema present.   Skin:    General: Skin is warm and dry.     Capillary Refill: Capillary refill takes less than 2 seconds.  Neurological:     Mental Status: He is alert.  Psychiatric:  Mood and Affect: Mood normal.        Behavior: Behavior normal.     ED Results and Treatments Labs (all labs ordered are listed, but only abnormal results are displayed) Labs Reviewed  CBC - Abnormal; Notable for the following components:      Result Value   RDW 16.4 (*)    Platelets 123 (*)    All other components within normal limits  PRO BRAIN NATRIURETIC PEPTIDE - Abnormal; Notable for the following components:   Pro Brain Natriuretic Peptide 19,343.0 (*)    All other components within normal limits  COMPREHENSIVE METABOLIC PANEL WITH GFR - Abnormal; Notable for the following components:   Potassium 3.1 (*)    BUN 38 (*)    Creatinine, Ser 2.17 (*)    Total Protein 6.4 (*)    Total Bilirubin 1.8 (*)    GFR, Estimated 32 (*)    All other components within normal limits  TROPONIN T, HIGH SENSITIVITY - Abnormal; Notable for the following components:   Troponin T High Sensitivity 67 (*)    All other components within normal limits  LIPASE, BLOOD  MAGNESIUM   URINALYSIS, ROUTINE W REFLEX MICROSCOPIC  LACTIC ACID, PLASMA  CBC  CREATININE, SERUM  COOXEMETRY PANEL  BASIC METABOLIC PANEL WITH GFR  MAGNESIUM   I-STAT CG4 LACTIC ACID, ED                                                                                                                          Radiology US  EKG SITE RITE Result Date: 10/18/2024 If Site Rite image not attached, placement could not be confirmed due to current cardiac rhythm.  DG Chest 2 View Result Date: 10/18/2024 EXAM: 2 VIEW(S) XRAY OF THE CHEST 10/18/2024 02:48:00 PM COMPARISON: 08/30/2024 CLINICAL HISTORY: SOB FINDINGS: LUNGS AND PLEURA: Mild right hemidiaphragm elevation. Mild pulmonary interstitial prominence. No focal pulmonary opacity. No pleural effusion. No  pneumothorax. HEART AND MEDIASTINUM: Mild cardiomegaly. No acute abnormality of the mediastinal silhouette. BONES AND SOFT TISSUES: Right shoulder prosthesis noted. No acute osseous abnormality. IMPRESSION: 1. Mild cardiomegaly and pulmonary interstitial prominence, favoring pulmonary venous congestion. No overt congestive heart failure. Electronically signed by: Rockey Kilts MD 10/18/2024 03:35 PM EST RP Workstation: [id number]    Pertinent labs & imaging results that were available during my care of the patient were reviewed by me and considered in my medical decision making (see MDM for details).  Medications Ordered in ED Medications  furosemide  (LASIX ) 120 mg in dextrose  5 % 50 mL IVPB (has no administration in time range)  potassium chloride  SA (KLOR-CON  M) CR tablet 40 mEq (has no administration in time range)  sacubitril -valsartan  (ENTRESTO ) 49-51 mg per tablet (has no administration in time range)  aspirin  chewable tablet 81 mg (has no administration in time range)  atorvastatin  (LIPITOR) tablet 10 mg (has no administration in time range)  spironolactone  (ALDACTONE ) tablet 25 mg (has no administration in time range)  empagliflozin  (JARDIANCE ) tablet 10 mg (  has no administration in time range)  sodium chloride  flush (NS) 0.9 % injection 3 mL (has no administration in time range)  sodium chloride  flush (NS) 0.9 % injection 3 mL (has no administration in time range)  0.9 %  sodium chloride  infusion (has no administration in time range)  acetaminophen  (TYLENOL ) tablet 650 mg (has no administration in time range)  ondansetron  (ZOFRAN ) injection 4 mg (has no administration in time range)  heparin  injection 5,000 Units (has no administration in time range)  furosemide  (LASIX ) 120 mg in dextrose  5 % 50 mL IVPB (has no administration in time range)                                                                                                                                      Procedures .Critical Care  Performed by: Elnor Jayson LABOR, DO Authorized by: Elnor Jayson LABOR, DO   Critical care provider statement:    Critical care time (minutes):  30   Critical care time was exclusive of:  Separately billable procedures and treating other patients   Critical care was necessary to treat or prevent imminent or life-threatening deterioration of the following conditions:  Cardiac failure   Critical care was time spent personally by me on the following activities:  Development of treatment plan with patient or surrogate, discussions with consultants, evaluation of patient's response to treatment, examination of patient, ordering and review of laboratory studies, ordering and review of radiographic studies, ordering and performing treatments and interventions, pulse oximetry, re-evaluation of patient's condition, review of old charts and obtaining history from patient or surrogate   Care discussed with: admitting provider     (including critical care time)  Medical Decision Making / ED Course    Medical Decision Making:    DIQUAN KASSIS is a 69 y.o. male with past medical history as below, significant for hyperlipidemia, hypertension, vertigo, obesity, systolic heart failure, CKD who presents to the ED with complaint of fluid overload, dyspnea. The complaint involves an extensive differential diagnosis and also carries with it a high risk of complications and morbidity.  Serious etiology was considered. Ddx includes but is not limited to: In my evaluation of this patient's dyspnea my DDx includes, but is not limited to, pneumonia, pulmonary embolism, pneumothorax, pulmonary edema, metabolic acidosis, asthma, COPD, cardiac cause, anemia, anxiety, etc.    Complete initial physical exam performed, notably the patient was in no acute distress, breathing comfortably ambient air.    Reviewed and confirmed nursing documentation for past medical history, family history, social  history.  Vital signs reviewed.    Acute CHF> - Approximate 13 pound weight gain - Increasing orthopnea, exertional dyspnea, fluid retention despite adherence to Lasix  increased dose - BNP over 19,000, troponin is elevated likely secondary to demand ischemia from acute CHF.  Creatinine is similar to his prior.  Chest x-ray concerning for acute CHF - Seen by cardiology  in the office and recommended admission, right heart cath, IV diuresis - Patient is agreeable to plan. - consult cardiology for admission     Clinical Course as of 10/18/24 1711  Mon Oct 18, 2024  1535 Seen by NP Jordan Lee in office PTA [SG]  1537 2D echo 09/06/2024 with LVEF less than 20%, G3 DD  [SG]  1537 R/L [facility] 07/05/24 with nonobstructive CAD [SG]  1537 CXR with cardiomegaly and pulmonary interstitial prominence, pulmonary venous congestion [SG]    Clinical Course User Index [SG] Elnor Jayson LABOR, DO     Cardiology will admit the patient               Additional history obtained: -Additional history obtained from na -External records from outside source obtained and reviewed including: Chart review including previous notes, labs, imaging, consultation notes including  Cardiology office notes Home medications Prior admission   Lab Tests: -I ordered, reviewed, and interpreted labs.   The pertinent results include:   Labs Reviewed  CBC - Abnormal; Notable for the following components:      Result Value   RDW 16.4 (*)    Platelets 123 (*)    All other components within normal limits  PRO BRAIN NATRIURETIC PEPTIDE - Abnormal; Notable for the following components:   Pro Brain Natriuretic Peptide 19,343.0 (*)    All other components within normal limits  COMPREHENSIVE METABOLIC PANEL WITH GFR - Abnormal; Notable for the following components:   Potassium 3.1 (*)    BUN 38 (*)    Creatinine, Ser 2.17 (*)    Total Protein 6.4 (*)    Total Bilirubin 1.8 (*)    GFR, Estimated 32 (*)    All  other components within normal limits  TROPONIN T, HIGH SENSITIVITY - Abnormal; Notable for the following components:   Troponin T High Sensitivity 67 (*)    All other components within normal limits  LIPASE, BLOOD  MAGNESIUM   URINALYSIS, ROUTINE W REFLEX MICROSCOPIC  LACTIC ACID, PLASMA  CBC  CREATININE, SERUM  COOXEMETRY PANEL  BASIC METABOLIC PANEL WITH GFR  MAGNESIUM   I-STAT CG4 LACTIC ACID, ED    Notable for as above  EKG   EKG Interpretation Date/Time:  Monday October 18 2024 14:14:17 EST Ventricular Rate:  84 PR Interval:  216 QRS Duration:  210 QT Interval:  468 QTC Calculation: 553 R Axis:   -83  Text Interpretation: Sinus rhythm with 1st degree A-V block with Fusion complexes Possible Left atrial enlargement Right bundle branch block Left anterior fascicular block Bifascicular block Left ventricular hypertrophy ( R in aVL , Romhilt-Estes ) Abnormal ECG When compared with ECG of 18-Oct-2024 12:26, PREVIOUS ECG IS PRESENT since last tracing no significant change Confirmed by Cleotilde Rogue ([id number]) on 10/18/2024 2:24:18 PM         Imaging Studies ordered: I ordered imaging studies including chest x-ray I independently visualized the following imaging with scope of interpretation limited to determining acute life threatening conditions related to emergency care; findings noted above I agree with the radiologist interpretation If any imaging was obtained with contrast I closely monitored patient for any possible adverse reaction a/w contrast administration in the emergency department   Medicines ordered and prescription drug management: Meds ordered this encounter  Medications   furosemide  (LASIX ) 120 mg in dextrose  5 % 50 mL IVPB   potassium chloride  SA (KLOR-CON  M) CR tablet 40 mEq   sacubitril -valsartan  (ENTRESTO ) 49-51 mg per tablet    ACE-inhibitors have NOT  been administered in the past 36-hours.:   YES (confirmed by ordering provider)   aspirin  chewable tablet  81 mg   atorvastatin  (LIPITOR) tablet 10 mg   spironolactone  (ALDACTONE ) tablet 25 mg   empagliflozin  (JARDIANCE ) tablet 10 mg   sodium chloride  flush (NS) 0.9 % injection 3 mL   sodium chloride  flush (NS) 0.9 % injection 3 mL   0.9 %  sodium chloride  infusion   acetaminophen  (TYLENOL ) tablet 650 mg   ondansetron  (ZOFRAN ) injection 4 mg   heparin  injection 5,000 Units   furosemide  (LASIX ) 120 mg in dextrose  5 % 50 mL IVPB    -I have reviewed the patients home medicines and have made adjustments as needed   Consultations Obtained: I requested consultation with the cardiology team  Cardiac Monitoring: The patient was maintained on a cardiac monitor.  I personally viewed and interpreted the cardiac monitored which showed an underlying rhythm of: NSR Continuous pulse oximetry interpreted by myself, 100% on room air.    Social Determinants of Health:  Diagnosis or treatment significantly limited by social determinants of health: obesity   Reevaluation: After the interventions noted above, I reevaluated the patient and found that they have stayed the same  Co morbidities that complicate the patient evaluation  Past Medical History:  Diagnosis Date   High cholesterol    Hypertension    Vertigo       Dispostion: Disposition decision including need for hospitalization was considered, and patient admitted to the hospital.    Final Clinical Impression(s) / ED Diagnoses Final diagnoses:  Acute congestive heart failure, unspecified heart failure type (HCC)         [1]  Social History Tobacco Use   Smoking status: Never   Smokeless tobacco: Never  Substance Use Topics   Alcohol use: Yes    Comment: socially   Drug use: No     Elnor Jayson LABOR, DO 10/18/24 1711  "

## 2024-10-18 NOTE — ED Triage Notes (Signed)
 Pt sent from heart failure clinic for increased swelling to lower extremities and increased SHOB for about a week.

## 2024-10-19 ENCOUNTER — Other Ambulatory Visit: Payer: Self-pay

## 2024-10-19 LAB — COOXEMETRY PANEL
Carboxyhemoglobin: 1.1 % (ref 0.5–1.5)
Methemoglobin: <0.7 % (ref 0.0–1.5)
O2 Saturation: 50.9 %
Total hemoglobin: 13.5 g/dL (ref 12.0–16.0)

## 2024-10-19 LAB — MAGNESIUM: Magnesium: 2.1 mg/dL (ref 1.7–2.4)

## 2024-10-19 LAB — BASIC METABOLIC PANEL WITH GFR
Anion gap: 12 (ref 5–15)
Anion gap: 10 (ref 5–15)
BUN: 39 mg/dL — ABNORMAL HIGH (ref 8–23)
BUN: 38 mg/dL — ABNORMAL HIGH (ref 8–23)
CO2: 31 mmol/L (ref 22–32)
CO2: 29 mmol/L (ref 22–32)
Calcium: 9.1 mg/dL (ref 8.9–10.3)
Calcium: 7.8 mg/dL — ABNORMAL LOW (ref 8.9–10.3)
Chloride: 99 mmol/L (ref 98–111)
Chloride: 100 mmol/L (ref 98–111)
Creatinine, Ser: 2.12 mg/dL — ABNORMAL HIGH (ref 0.61–1.24)
Creatinine, Ser: 2.28 mg/dL — ABNORMAL HIGH (ref 0.61–1.24)
GFR, Estimated: 33 mL/min — ABNORMAL LOW
GFR, Estimated: 30 mL/min — ABNORMAL LOW
Glucose, Bld: 96 mg/dL (ref 70–99)
Glucose, Bld: 155 mg/dL — ABNORMAL HIGH (ref 70–99)
Potassium: 3 mmol/L — ABNORMAL LOW (ref 3.5–5.1)
Potassium: 3.1 mmol/L — ABNORMAL LOW (ref 3.5–5.1)
Sodium: 143 mmol/L (ref 135–145)
Sodium: 140 mmol/L (ref 135–145)

## 2024-10-19 LAB — IRON AND TIBC
Iron: 46 ug/dL (ref 45–182)
Saturation Ratios: 17 % — ABNORMAL LOW (ref 17.9–39.5)
TIBC: 262 ug/dL (ref 250–450)
UIBC: 216 ug/dL

## 2024-10-19 LAB — FERRITIN: Ferritin: 101 ng/mL (ref 24–336)

## 2024-10-19 MED ORDER — SODIUM CHLORIDE 0.9 % IV SOLN
500.0000 mg | Freq: Once | INTRAVENOUS | Status: AC
  Administered 2024-10-19: 500 mg via INTRAVENOUS
  Filled 2024-10-19: qty 25

## 2024-10-19 MED ORDER — SODIUM CHLORIDE 0.9 % IV SOLN: 250.0000 mL | INTRAVENOUS | Status: DC | PRN

## 2024-10-19 MED ORDER — POTASSIUM CHLORIDE 10 MEQ/50ML IV SOLN
10.0000 meq | INTRAVENOUS | Status: DC
  Filled 2024-10-19 (×2): qty 50

## 2024-10-19 MED ORDER — MILRINONE LACTATE IN DEXTROSE 20-5 MG/100ML-% IV SOLN
0.1250 ug/kg/min | INTRAVENOUS | Status: DC
  Administered 2024-10-19 – 2024-10-21 (×4): 0.25 ug/kg/min via INTRAVENOUS
  Filled 2024-10-19 (×4): qty 100

## 2024-10-19 MED ORDER — POTASSIUM CHLORIDE 10 MEQ/50ML IV SOLN
10.0000 meq | INTRAVENOUS | Status: AC
  Administered 2024-10-19 (×3): 10 meq via INTRAVENOUS
  Filled 2024-10-19 (×5): qty 50

## 2024-10-19 MED ORDER — ASPIRIN 81 MG PO TBEC
81.0000 mg | DELAYED_RELEASE_TABLET | Freq: Every day | ORAL | Status: DC
  Administered 2024-10-19 – 2024-10-26 (×8): 81 mg via ORAL
  Filled 2024-10-19 (×8): qty 1

## 2024-10-19 MED ORDER — IRON SUCROSE 500 MG IVPB - SIMPLE MED
500.0000 mg | Freq: Once | INTRAVENOUS | Status: DC
  Filled 2024-10-19: qty 275

## 2024-10-19 MED ORDER — SODIUM CHLORIDE 0.9% FLUSH: 3.0000 mL | INTRAVENOUS | Status: DC | PRN

## 2024-10-19 MED ORDER — POTASSIUM CHLORIDE CRYS ER 20 MEQ PO TBCR
40.0000 meq | EXTENDED_RELEASE_TABLET | ORAL | Status: AC
  Administered 2024-10-19 (×2): 40 meq via ORAL
  Filled 2024-10-19 (×2): qty 2

## 2024-10-19 MED ORDER — SODIUM CHLORIDE 0.9% FLUSH
3.0000 mL | Freq: Two times a day (BID) | INTRAVENOUS | Status: DC
  Administered 2024-10-19 – 2024-10-20 (×2): 3 mL via INTRAVENOUS

## 2024-10-19 MED ORDER — HYDRALAZINE HCL 25 MG PO TABS
25.0000 mg | ORAL_TABLET | Freq: Three times a day (TID) | ORAL | Status: DC
  Administered 2024-10-19 – 2024-10-20 (×4): 25 mg via ORAL
  Filled 2024-10-19 (×4): qty 1

## 2024-10-19 MED ORDER — ISOSORBIDE MONONITRATE ER 30 MG PO TB24
30.0000 mg | ORAL_TABLET | Freq: Every day | ORAL | Status: DC
  Administered 2024-10-19: 30 mg via ORAL
  Filled 2024-10-19: qty 1

## 2024-10-19 MED ORDER — METOLAZONE 2.5 MG PO TABS
2.5000 mg | ORAL_TABLET | Freq: Once | ORAL | Status: AC
  Administered 2024-10-19: 2.5 mg via ORAL
  Filled 2024-10-19: qty 1

## 2024-10-19 NOTE — Progress Notes (Signed)
 Patient ID: Jonathan Fowler, male   DOB: 08/09/1956, 69 y.o.   MRN: [id number]     Advanced Heart Failure Rounding Note  Cardiologist: None   Chief Complaint: CHF     Co-ox 51% with lactate 2.  Some diuresis with Lasix  120 mg IV last night. Creatinine stable at 2.1.   Feels ok this morning, comfortable at rest.    Objective:   Weight Range: 101.2 kg Body mass index is 31.1 kg/m.   Vital Signs:   Temp:  [97.5 F (36.4 C)-98.4 F (36.9 C)] 97.7 F (36.5 C) (01/06 0726) Pulse Rate:  [61-86] 70 (01/06 0726) Resp:  [9-24] 14 (01/06 0726) BP: (123-150)/(89-112) 150/105 (01/06 0726) SpO2:  [94 %-100 %] 100 % (01/06 0726) Weight:  [101.2 kg] 101.2 kg (01/06 0800)    Weight change: Filed Weights   10/19/24 0800  Weight: 101.2 kg    Intake/Output:   Intake/Output Summary (Last 24 hours) at 10/19/2024 0807 Last data filed at 10/19/2024 0727 Gross per 24 hour  Intake 100 ml  Output 1800 ml  Net -1700 ml     Physical Exam   General: NAD Neck: JVP 16 cm, no thyromegaly or thyroid  nodule.  Lungs: Clear to auscultation bilaterally with normal respiratory effort. CV: Nondisplaced PMI.  Heart regular S1/S2 with widely split S2, no S3/S4, no murmur.  1+ edema to knees.  Abdomen: Soft, nontender, no hepatosplenomegaly, no distention.  Skin: Intact without lesions or rashes.  Neurologic: Alert and oriented x 3.  Psych: Normal affect. Extremities: No clubbing or cyanosis.  HEENT: Normal.   Telemetry   NSR with PVCs (personally reviewed)  Labs   CBC Recent Labs    10/18/24 1439 10/18/24 1700 10/18/24 1850  WBC 6.3 6.4  --   HGB 14.2 14.4 15.3  HCT 42.4 43.5 45.0  MCV 93.6 94.8  --   PLT 123* 148*  --    Basic Metabolic Panel Recent Labs    98/94/73 1439 10/18/24 1700 10/18/24 1850 10/19/24 0344  NA 144  --  142 143  K 3.1*  --  3.2* 3.0*  CL 99  --  99 99  CO2 30  --   --  31  GLUCOSE 95  --  84 96  BUN 38*  --  45* 39*  CREATININE 2.17*   < > 2.20*  2.12*  CALCIUM  9.5  --   --  9.1  MG 1.7  --   --  2.1   < > = values in this interval not displayed.   Liver Function Tests Recent Labs    10/18/24 1439  AST 34  ALT 26  ALKPHOS 88  BILITOT 1.8*  PROT 6.4*  ALBUMIN 4.2   Recent Labs    10/18/24 1439  LIPASE 48   Cardiac Enzymes No results for input(s): CKTOTAL, CKMB, CKMBINDEX, TROPONINI in the last 72 hours.  BNP: BNP (last 3 results) Recent Labs    06/29/24 1412 08/30/24 1923 09/27/24 0953  BNP 1,746.1* >4,500.0* >4,500.0*    ProBNP (last 3 results) Recent Labs    10/18/24 1439  PROBNP 19,343.0*     D-Dimer No results for input(s): DDIMER in the last 72 hours. Hemoglobin A1C No results for input(s): HGBA1C in the last 72 hours. Fasting Lipid Panel No results for input(s): CHOL, HDL, LDLCALC, TRIG, CHOLHDL, LDLDIRECT in the last 72 hours. Medications:   Scheduled Medications:  aspirin  EC  81 mg Oral Daily   atorvastatin   10 mg Oral  Daily   Chlorhexidine  Gluconate Cloth  6 each Topical Daily   heparin   5,000 Units Subcutaneous Q8H   hydrALAZINE   25 mg Oral Q8H   isosorbide  mononitrate  30 mg Oral Daily   metolazone   2.5 mg Oral Once   potassium chloride   40 mEq Oral Q4H   sacubitril -valsartan   1 tablet Oral BID   sodium chloride  flush  10-40 mL Intracatheter Q12H   sodium chloride  flush  3 mL Intravenous Q12H   spironolactone   25 mg Oral Daily    Infusions:  sodium chloride      furosemide  Stopped (10/18/24 2235)   milrinone      potassium chloride       PRN Medications: sodium chloride , acetaminophen , ondansetron  (ZOFRAN ) IV, sodium chloride  flush, sodium chloride  flush  Assessment/Plan   1. Acute on chronic systolic CHF: Biventricular failure, suspect nearing end stage.  NICM.  Cath 9/25 with nonobstructive CAD and preserved CI.  cMRI 9/25 with LV EF 16%, RV EF 19%, basal septal LGE (nonspecific, seen with dilated CMP).  Echo in 12/25 with EF 18%, severe LV dilation,  severe RV dysfunction, severe biatrial enlargement, IVC dilated. CPX with moderate-severe functional impairment due to HF. NYHA class IIIb.    BP elevated but pulse pressure narrow. He has been taking torsemide  40 qam/20 qpm at home.  Situation complicated by CKD stage 3.  On exam today, remains volume overloaded.  Co-ox 51% with lactate 2.0.  Creatinine stable 2.12.  - Start milrinone  0.25 now.  - Follow CVP when gets to progressive room.  - Lasix  120 mg IV bid, will give metolazone  2.5 mg x 1 today.  - Hold Coreg .  - Continue Entresto  for now at lower dose 49/51 bid until we see which way creatinine trends.  - Continue spironolactone  25 daily. - Holding Jardiance .  - BP remains high, add hydralazine /Imdur  for afterload reduction for now.  - I worry for developing end stage biventricular cardiomyopathy.  I do not think that he is going to be a very good LVAD candidate with significant RV failure and elevated creatinine.  Heart/kidney transplant would be a consideration but he will have a narrow window for this.  - Will need eventual RHC.  - He has a wide RBBB, probably not going to get marked improvement from CRT.  2. CKD stage 3: Creatinine 2.17 => 2.12, this seems to be around his baseline for at least the last month.  Suspect cardiorenal syndrome.  - May improve with inotrope  3. DM2: SSI.  4. HTN: Meds as above.   Still waiting for progressive bed.   Length of Stay: 1  Ezra Shuck, MD  10/19/2024, 8:07 AM  Advanced Heart Failure Team Pager [phone] (M-F; 7a - 5p)   Please visit [email or web address]: For overnight coverage please call cardiology fellow first. If fellow not available call Shock/ECMO MD on call.  For ECMO / Mechanical Support (Impella, IABP, LVAD) issues call Shock / ECMO MD on call.

## 2024-10-19 NOTE — Progress Notes (Signed)
 Patient ID: Jonathan Fowler, male   DOB: June 06, 1956, 69 y.o.   MRN: [id number] Patient refused peripheral IV making many of his IV medications staggered today. Pharmacy aware.  Verdie JONETTA Collier, RN

## 2024-10-19 NOTE — ED Notes (Signed)
 ED TO INPATIENT HANDOFF REPORT  ED Nurse Name and Phone #: Mitzie Flaming -phoneless, send me an Epic chat if needed :)  S Name/Age/Gender Jonathan Fowler 69 y.o. male Room/Bed: 012C/012C  Code Status   Code Status: Full Code  Home/SNF/Other Home Patient oriented to: self, place, time, and situation Is this baseline? Yes   Triage Complete: Triage complete  Chief Complaint Acute on chronic systolic heart failure (HCC) [I50.23]  Triage Note Pt sent from heart failure clinic for increased swelling to lower extremities and increased SHOB for about a week.   Pt states he is more SOB with exertion, takes his medications religiously. States he is having swelling in legs and abdomen.    Allergies Allergies[1]  Level of Care/Admitting Diagnosis ED Disposition     ED Disposition  Admit   Condition  --   Comment  Hospital Area: [facility] [[id number]]  Level of Care: Progressive [102]  Admit to Progressive based on following criteria: CARDIOVASCULAR & THORACIC of moderate stability with acute coronary syndrome symptoms/low risk myocardial infarction/hypertensive urgency/arrhythmias/heart failure potentially compromising stability and stable post cardiovascular intervention patients.  May admit patient to [facility] or [facility] if equivalent level of care is available:: No  Bed request comments: 2C, 3E or 6E  Diagnosis: Acute on chronic systolic heart failure (HCC) [428.23.ICD-9-CM]  Admitting Physician: ROLAN EZRA GORMAN CARMELL  Attending Physician: ROLAN EZRA GORMAN [phone]  Certification:: I certify this patient will need inpatient services for at least 2 midnights  Expected Medical Readiness: 10/22/2024          B Medical/Surgery History Past Medical History:  Diagnosis Date   High cholesterol    Hypertension    Vertigo    Past Surgical History:  Procedure Laterality Date   RIGHT HEART CATH N/A 07/02/2024   Procedure: RIGHT HEART CATH;   Surgeon: Gardenia Led, DO;  Location: [facility] INVASIVE CV LAB;  Service: Cardiovascular;  Laterality: N/A;   RIGHT/LEFT HEART CATH AND CORONARY ANGIOGRAPHY N/A 07/05/2024   Procedure: RIGHT/LEFT HEART CATH AND CORONARY ANGIOGRAPHY;  Surgeon: Rolan Ezra GORMAN, MD;  Location: [facility] INVASIVE CV LAB;  Service: Cardiovascular;  Laterality: N/A;     A IV Location/Drains/Wounds Patient Lines/Drains/Airways Status     Active Line/Drains/Airways     Name Placement date Placement time Site Days   PICC Double Lumen 10/18/24 Right Basilic 42 cm 1 cm 10/18/24  8089  -- 1            Intake/Output Last 24 hours  Intake/Output Summary (Last 24 hours) at 10/19/2024 0956 Last data filed at 10/19/2024 0934 Gross per 24 hour  Intake 153.41 ml  Output 1800 ml  Net -1646.59 ml    Labs/Imaging Results for orders placed or performed during the hospital encounter of 10/18/24 (from the past 48 hours)  CBC     Status: Abnormal   Collection Time: 10/18/24  2:39 PM  Result Value Ref Range   WBC 6.3 4.0 - 10.5 K/uL   RBC 4.53 4.22 - 5.81 MIL/uL   Hemoglobin 14.2 13.0 - 17.0 g/dL   HCT 57.5 60.9 - 47.9 %   MCV 93.6 80.0 - 100.0 fL   MCH 31.3 26.0 - 34.0 pg   MCHC 33.5 30.0 - 36.0 g/dL   RDW 83.5 (H) 88.4 - 84.4 %   Platelets 123 (L) 150 - 400 K/uL   nRBC 0.0 0.0 - 0.2 %    Comment: Performed at [facility] Lab, [address], [place], [place] [address]  Troponin T, High Sensitivity     Status: Abnormal   Collection Time: 10/18/24  2:39 PM  Result Value Ref Range   Troponin T High Sensitivity 67 (H) 0 - 19 ng/L    Comment: (NOTE) Biotin concentrations > 1000 ng/mL falsely decrease TnT results.  Serial cardiac troponin measurements are suggested.  Refer to the Links section for chest pain algorithms and additional  guidance. Performed at [facility], [address], [place], [place] [address]   Pro Brain natriuretic peptide     Status: Abnormal   Collection Time: 10/18/24  2:39 PM   Result Value Ref Range   Pro Brain Natriuretic Peptide 19,343.0 (H) <300.0 pg/mL    Comment: (NOTE) Age Group        Cut-Points    Interpretation  < 50 years     450 pg/mL       NT-proBNP > 450 pg/mL indicates                                ADHF is likely              50 to 75 years  900 pg/mL      NT-proBNP > 900 pg/mL indicates          ADHF is likely  > 75 years      1800 pg/mL     NT-proBNP > 1800 pg/mL indicates          ADHF is likely                           All ages    Results between       Indeterminate. Further clinical             300 and the cut-   information is needed to determine            point for age group   if ADHF is present.                                                             Elecsys proBNP II/ Elecsys proBNP II STAT           Cut-Point                       Interpretation  300 pg/mL                    NT-proBNP <300pg/mL indicates                             ADHF is not likely  Performed at [facility], [address] [place], [place] [address]   Comprehensive metabolic panel     Status: Abnormal   Collection Time: 10/18/24  2:39 PM  Result Value Ref Range   Sodium 144 135 - 145 mmol/L   Potassium 3.1 (L) 3.5 - 5.1 mmol/L   Chloride 99 98 - 111 mmol/L   CO2 30 22 - 32 mmol/L   Glucose, Bld 95 70 - 99 mg/dL  Comment: Glucose reference range applies only to samples taken after fasting for at least 8 hours.   BUN 38 (H) 8 - 23 mg/dL   Creatinine, Ser 7.82 (H) 0.61 - 1.24 mg/dL   Calcium  9.5 8.9 - 10.3 mg/dL   Total Protein 6.4 (L) 6.5 - 8.1 g/dL   Albumin 4.2 3.5 - 5.0 g/dL   AST 34 15 - 41 U/L   ALT 26 0 - 44 U/L   Alkaline Phosphatase 88 38 - 126 U/L   Total Bilirubin 1.8 (H) 0.0 - 1.2 mg/dL   GFR, Estimated 32 (L) >60 mL/min    Comment: (NOTE) Calculated using the CKD-EPI Creatinine Equation (2021)    Anion gap 15 5 - 15    Comment: Performed at [facility], [address] [place], [place] [address]  Lipase,  blood     Status: None   Collection Time: 10/18/24  2:39 PM  Result Value Ref Range   Lipase 48 11 - 51 U/L    Comment: Performed at [facility], [address] [place], [place] [address]  Magnesium      Status: None   Collection Time: 10/18/24  2:39 PM  Result Value Ref Range   Magnesium  1.7 1.7 - 2.4 mg/dL    Comment: Performed at [facility], [address], [place], [place] [address]  Lactic acid, plasma     Status: Abnormal   Collection Time: 10/18/24  4:05 PM  Result Value Ref Range   Lactic Acid, Venous 2.0 (HH) 0.5 - 1.9 mmol/L    Comment: attempted to call @ [phone] 10/18/24 by sekdahl attempted to call @ 1756 Critical Value, Read Back and verified with J. DURENDA, RN @ [phone] 10/18/24 BY [facility] Performed at [facility] Lab, [address], [place], [place] [address]   I-Stat CG4 Lactic Acid     Status: None   Collection Time: 10/18/24  4:16 PM  Result Value Ref Range   Lactic Acid, Venous 1.6 0.5 - 1.9 mmol/L  Urinalysis, Routine w reflex microscopic -Urine, Clean Catch     Status: Abnormal   Collection Time: 10/18/24  5:00 PM  Result Value Ref Range   Color, Urine YELLOW YELLOW   APPearance CLEAR CLEAR   Specific Gravity, Urine 1.010 1.005 - 1.030   pH 5.0 5.0 - 8.0   Glucose, UA 150 (A) NEGATIVE mg/dL   Hgb urine dipstick NEGATIVE NEGATIVE   Bilirubin Urine NEGATIVE NEGATIVE   Ketones, ur NEGATIVE NEGATIVE mg/dL   Protein, ur 899 (A) NEGATIVE mg/dL   Nitrite NEGATIVE NEGATIVE   Leukocytes,Ua NEGATIVE NEGATIVE   RBC / HPF 0-5 0 - 5 RBC/hpf   WBC, UA 0-5 0 - 5 WBC/hpf   Bacteria, UA RARE (A) NONE SEEN   Squamous Epithelial / HPF 0-5 0 - 5 /HPF   Mucus PRESENT    Hyaline Casts, UA PRESENT     Comment: Performed at [facility], [address], [place], [place] [address]  CBC     Status: Abnormal   Collection Time: 10/18/24  5:00 PM  Result Value Ref Range   WBC 6.4 4.0 - 10.5 K/uL   RBC 4.59 4.22 - 5.81 MIL/uL   Hemoglobin 14.4 13.0 - 17.0  g/dL   HCT 56.4 60.9 - 47.9 %   MCV 94.8 80.0 - 100.0 fL   MCH 31.4 26.0 - 34.0 pg   MCHC 33.1 30.0 - 36.0 g/dL   RDW 83.5 (  H) 11.5 - 15.5 %   Platelets 148 (L) 150 - 400 K/uL   nRBC 0.0 0.0 - 0.2 %    Comment: Performed at [facility], [address], [place], [place] [address]  Creatinine, serum     Status: Abnormal   Collection Time: 10/18/24  5:00 PM  Result Value Ref Range   Creatinine, Ser 2.18 (H) 0.61 - 1.24 mg/dL   GFR, Estimated 32 (L) >60 mL/min    Comment: (NOTE) Calculated using the CKD-EPI Creatinine Equation (2021) Performed at [facility], [address] [place], [place] [address]   I-stat chem 8, ed     Status: Abnormal   Collection Time: 10/18/24  6:50 PM  Result Value Ref Range   Sodium 142 135 - 145 mmol/L   Potassium 3.2 (L) 3.5 - 5.1 mmol/L   Chloride 99 98 - 111 mmol/L   BUN 45 (H) 8 - 23 mg/dL   Creatinine, Ser 7.79 (H) 0.61 - 1.24 mg/dL   Glucose, Bld 84 70 - 99 mg/dL    Comment: Glucose reference range applies only to samples taken after fasting for at least 8 hours.   Calcium , Ion 1.06 (L) 1.15 - 1.40 mmol/L   TCO2 30 22 - 32 mmol/L   Hemoglobin 15.3 13.0 - 17.0 g/dL   HCT 54.9 60.9 - 47.9 %  I-Stat CG4 Lactic Acid     Status: Abnormal   Collection Time: 10/18/24  6:57 PM  Result Value Ref Range   Lactic Acid, Venous 2.0 (HH) 0.5 - 1.9 mmol/L  Cooxemetry Panel (carboxy, met, total hgb, O2 sat)     Status: None   Collection Time: 10/19/24  3:44 AM  Result Value Ref Range   Total hemoglobin 13.5 12.0 - 16.0 g/dL   O2 Saturation 49.0 %   Carboxyhemoglobin 1.1 0.5 - 1.5 %   Methemoglobin <0.7 0.0 - 1.5 %    Comment: Performed at [facility], [address] [place], [place] [address]  Basic metabolic panel     Status: Abnormal   Collection Time: 10/19/24  3:44 AM  Result Value Ref Range   Sodium 143 135 - 145 mmol/L   Potassium 3.0 (L) 3.5 - 5.1 mmol/L   Chloride 99 98 - 111 mmol/L   CO2 31 22 - 32 mmol/L   Glucose, Bld  96 70 - 99 mg/dL    Comment: Glucose reference range applies only to samples taken after fasting for at least 8 hours.   BUN 39 (H) 8 - 23 mg/dL   Creatinine, Ser 7.87 (H) 0.61 - 1.24 mg/dL   Calcium  9.1 8.9 - 10.3 mg/dL   GFR, Estimated 33 (L) >60 mL/min    Comment: (NOTE) Calculated using the CKD-EPI Creatinine Equation (2021)    Anion gap 12 5 - 15    Comment: Performed at [facility], [address], [place], [place] [address]  Magnesium      Status: None   Collection Time: 10/19/24  3:44 AM  Result Value Ref Range   Magnesium  2.1 1.7 - 2.4 mg/dL    Comment: Performed at [facility], [address], [place], [place] [address]   US  EKG SITE RITE Result Date: 10/18/2024 If [facility] image not attached, placement could not be confirmed due to current cardiac rhythm.  DG Chest 2 View Result Date: 10/18/2024 EXAM: 2 VIEW(S) XRAY OF THE CHEST 10/18/2024 02:48:00 PM COMPARISON: 08/30/2024 CLINICAL HISTORY: SOB FINDINGS: LUNGS  AND PLEURA: Mild right hemidiaphragm elevation. Mild pulmonary interstitial prominence. No focal pulmonary opacity. No pleural effusion. No pneumothorax. HEART AND MEDIASTINUM: Mild cardiomegaly. No acute abnormality of the mediastinal silhouette. BONES AND SOFT TISSUES: Right shoulder prosthesis noted. No acute osseous abnormality. IMPRESSION: 1. Mild cardiomegaly and pulmonary interstitial prominence, favoring pulmonary venous congestion. No overt congestive heart failure. Electronically signed by: Rockey Kilts MD 10/18/2024 03:35 PM EST RP Workstation: [id number]    Pending Labs Unresulted Labs (From admission, onward)     Start     Ordered   10/20/24 0500  CBC  Daily,   R      10/19/24 0806   10/20/24 0500  Magnesium   Daily,   R      10/19/24 0827   10/19/24 1300  Basic metabolic panel with GFR  Once-Timed,   TIMED        10/19/24 0827   10/19/24 0500  Cooxemetry Panel (carboxy, met, total hgb, O2 sat)  Daily,   R      10/18/24 1622   10/19/24 0500   Basic metabolic panel  Daily,   R     Comments: As Scheduled for 5 days    10/18/24 1641            Vitals/Pain Today's Vitals   10/19/24 0230 10/19/24 0405 10/19/24 0726 10/19/24 0800  BP: (!) 147/105 (!) 145/101 (!) 150/105   Pulse: 82 70 70   Resp: (!) 24 (!) 23 14   Temp:  98 F (36.7 C) 97.7 F (36.5 C)   TempSrc:  Oral Oral   SpO2: 100% 100% 100%   Weight:    101.2 kg  PainSc:  0-No pain      Isolation Precautions No active isolations  Medications Medications  sacubitril -valsartan  (ENTRESTO ) 49-51 mg per tablet (1 tablet Oral Given 10/18/24 2129)  atorvastatin  (LIPITOR) tablet 10 mg (10 mg Oral Given 10/19/24 0934)  spironolactone  (ALDACTONE ) tablet 25 mg (25 mg Oral Given 10/19/24 0933)  sodium chloride  flush (NS) 0.9 % injection 3 mL (3 mLs Intravenous Not Given 10/18/24 2137)  sodium chloride  flush (NS) 0.9 % injection 3 mL (has no administration in time range)  0.9 %  sodium chloride  infusion (has no administration in time range)  acetaminophen  (TYLENOL ) tablet 650 mg (has no administration in time range)  ondansetron  (ZOFRAN ) injection 4 mg (has no administration in time range)  heparin  injection 5,000 Units (5,000 Units Subcutaneous Given 10/19/24 9361)  furosemide  (LASIX ) 120 mg in dextrose  5 % 50 mL IVPB (0 mg Intravenous Stopped 10/18/24 2235)  sodium chloride  flush (NS) 0.9 % injection 10-40 mL ( Intracatheter Not Given 10/18/24 2137)  sodium chloride  flush (NS) 0.9 % injection 10-40 mL (has no administration in time range)  Chlorhexidine  Gluconate Cloth 2 % PADS 6 each (0 each Topical Hold 10/18/24 2128)  potassium chloride  SA (KLOR-CON  M) CR tablet 40 mEq (40 mEq Oral Given 10/19/24 0933)  potassium chloride  10 mEq in 50 mL *CENTRAL LINE* IVPB (0 mEq Intravenous Stopped 10/19/24 0934)  aspirin  EC tablet 81 mg (81 mg Oral Given 10/19/24 0933)  milrinone  (PRIMACOR ) 20 MG/100 ML (0.2 mg/mL) infusion (0.25 mcg/kg/min  101.2 kg (Dosing Weight) Intravenous New Bag/Given 10/19/24  0824)  hydrALAZINE  (APRESOLINE ) tablet 25 mg (25 mg Oral Given 10/19/24 0933)  isosorbide  mononitrate (IMDUR ) 24 hr tablet 30 mg (30 mg Oral Given 10/19/24 0934)  furosemide  (LASIX ) 120 mg in dextrose  5 % 50 mL IVPB (0 mg Intravenous Stopped 10/18/24 1832)  potassium  chloride SA (KLOR-CON  M) CR tablet 40 mEq (40 mEq Oral Given 10/18/24 2037)  magnesium  sulfate IVPB 2 g 50 mL (0 g Intravenous Stopped 10/18/24 2031)  metolazone  (ZAROXOLYN ) tablet 2.5 mg (2.5 mg Oral Given 10/19/24 0933)    Mobility walks     Focused Assessments Cardiac Assessment Handoff:  Cardiac Rhythm: Ventricular paced Lab Results  Component Value Date   TROPONINI <0.03 05/14/2018   No results found for: DDIMER Does the Patient currently have chest pain? No    R Recommendations: See Admitting Provider Note  Report given to:   Additional Notes:  Pt receiving 2 of 5 K bags at this time. Pt was to start Milrinone  first and then Lasix  drip approx 30 mins after per MD Rolan. Waiting on Lasix  delivery from main pharmacy.       [1]  Allergies Allergen Reactions   Loratadine     Headache    Lisinopril Cough   Mobic [Meloxicam] Nausea And Vomiting   Penicillins Nausea Only and Rash    Has patient had a PCN reaction causing immediate rash, facial/tongue/throat swelling, SOB or lightheadedness with hypotension: No Has patient had a PCN reaction causing severe rash involving mucus membranes or skin necrosis: No Has patient had a PCN reaction that required hospitalization: No Has patient had a PCN reaction occurring within the last 10 years: No If all of the above answers are NO, then may proceed with Cephalosporin use.

## 2024-10-20 ENCOUNTER — Ambulatory Visit (HOSPITAL_COMMUNITY): Admission: RE | Admit: 2024-10-20 | Source: Home / Self Care | Admitting: Cardiology

## 2024-10-20 ENCOUNTER — Encounter (HOSPITAL_COMMUNITY)

## 2024-10-20 ENCOUNTER — Encounter (HOSPITAL_COMMUNITY)
Admission: EM | Disposition: A | Discharge status: Home / Self Care | Payer: Self-pay | Source: Home / Self Care | Attending: Cardiology

## 2024-10-20 DIAGNOSIS — I509 Heart failure, unspecified: Secondary | ICD-10-CM

## 2024-10-20 HISTORY — PX: RIGHT HEART CATH: CATH118263

## 2024-10-20 LAB — POCT I-STAT EG7
Acid-Base Excess: 7 mmol/L — ABNORMAL HIGH (ref 0.0–2.0)
Acid-Base Excess: 9 mmol/L — ABNORMAL HIGH (ref 0.0–2.0)
Bicarbonate: 31.6 mmol/L — ABNORMAL HIGH (ref 20.0–28.0)
Bicarbonate: 33.8 mmol/L — ABNORMAL HIGH (ref 20.0–28.0)
Calcium, Ion: 1.18 mmol/L (ref 1.15–1.40)
Calcium, Ion: 1.19 mmol/L (ref 1.15–1.40)
HCT: 38 % — ABNORMAL LOW (ref 39.0–52.0)
HCT: 38 % — ABNORMAL LOW (ref 39.0–52.0)
Hemoglobin: 12.9 g/dL — ABNORMAL LOW (ref 13.0–17.0)
Hemoglobin: 12.9 g/dL — ABNORMAL LOW (ref 13.0–17.0)
O2 Saturation: 66 %
O2 Saturation: 69 %
Potassium: 3.1 mmol/L — ABNORMAL LOW (ref 3.5–5.1)
Potassium: 3.1 mmol/L — ABNORMAL LOW (ref 3.5–5.1)
Sodium: 139 mmol/L (ref 135–145)
Sodium: 140 mmol/L (ref 135–145)
TCO2: 33 mmol/L — ABNORMAL HIGH (ref 22–32)
TCO2: 35 mmol/L — ABNORMAL HIGH (ref 22–32)
pCO2, Ven: 43.9 mmHg — ABNORMAL LOW (ref 44–60)
pCO2, Ven: 46.9 mmHg (ref 44–60)
pH, Ven: 7.465 — ABNORMAL HIGH (ref 7.25–7.43)
pH, Ven: 7.465 — ABNORMAL HIGH (ref 7.25–7.43)
pO2, Ven: 33 mmHg (ref 32–45)
pO2, Ven: 34 mmHg (ref 32–45)

## 2024-10-20 LAB — BASIC METABOLIC PANEL WITH GFR
Anion gap: 11 (ref 5–15)
Anion gap: 8 (ref 5–15)
BUN: 37 mg/dL — ABNORMAL HIGH (ref 8–23)
BUN: 34 mg/dL — ABNORMAL HIGH (ref 8–23)
CO2: 31 mmol/L (ref 22–32)
CO2: 33 mmol/L — ABNORMAL HIGH (ref 22–32)
Calcium: 8.7 mg/dL — ABNORMAL LOW (ref 8.9–10.3)
Calcium: 8.6 mg/dL — ABNORMAL LOW (ref 8.9–10.3)
Chloride: 96 mmol/L — ABNORMAL LOW (ref 98–111)
Chloride: 96 mmol/L — ABNORMAL LOW (ref 98–111)
Creatinine, Ser: 2.24 mg/dL — ABNORMAL HIGH (ref 0.61–1.24)
Creatinine, Ser: 2.1 mg/dL — ABNORMAL HIGH (ref 0.61–1.24)
GFR, Estimated: 31 mL/min — ABNORMAL LOW
GFR, Estimated: 34 mL/min — ABNORMAL LOW
Glucose, Bld: 118 mg/dL — ABNORMAL HIGH (ref 70–99)
Glucose, Bld: 188 mg/dL — ABNORMAL HIGH (ref 70–99)
Potassium: 2.8 mmol/L — ABNORMAL LOW (ref 3.5–5.1)
Potassium: 3.4 mmol/L — ABNORMAL LOW (ref 3.5–5.1)
Sodium: 138 mmol/L (ref 135–145)
Sodium: 137 mmol/L (ref 135–145)

## 2024-10-20 LAB — COOXEMETRY PANEL
Carboxyhemoglobin: 1.4 % (ref 0.5–1.5)
Methemoglobin: 0.7 % (ref 0.0–1.5)
O2 Saturation: 58.9 %
Total hemoglobin: 12.4 g/dL (ref 12.0–16.0)

## 2024-10-20 LAB — CBC
HCT: 35.6 % — ABNORMAL LOW (ref 39.0–52.0)
Hemoglobin: 12.2 g/dL — ABNORMAL LOW (ref 13.0–17.0)
MCH: 31.4 pg (ref 26.0–34.0)
MCHC: 34.3 g/dL (ref 30.0–36.0)
MCV: 91.8 fL (ref 80.0–100.0)
Platelets: 103 K/uL — ABNORMAL LOW (ref 150–400)
RBC: 3.88 MIL/uL — ABNORMAL LOW (ref 4.22–5.81)
RDW: 16.1 % — ABNORMAL HIGH (ref 11.5–15.5)
WBC: 4.9 K/uL (ref 4.0–10.5)
nRBC: 0 % (ref 0.0–0.2)

## 2024-10-20 LAB — MAGNESIUM: Magnesium: 2.1 mg/dL (ref 1.7–2.4)

## 2024-10-20 MED ORDER — POTASSIUM CHLORIDE 10 MEQ/50ML IV SOLN
10.0000 meq | INTRAVENOUS | Status: AC
  Administered 2024-10-20 (×2): 10 meq via INTRAVENOUS
  Filled 2024-10-20 (×2): qty 50

## 2024-10-20 MED ORDER — POTASSIUM CHLORIDE CRYS ER 20 MEQ PO TBCR
40.0000 meq | EXTENDED_RELEASE_TABLET | ORAL | Status: AC
  Administered 2024-10-20 (×2): 40 meq via ORAL
  Filled 2024-10-20 (×2): qty 2

## 2024-10-20 MED ORDER — MELATONIN 3 MG PO TABS
6.0000 mg | ORAL_TABLET | Freq: Once | ORAL | Status: AC
  Administered 2024-10-20: 6 mg via ORAL
  Filled 2024-10-20: qty 2

## 2024-10-20 MED ORDER — NIFEDIPINE ER OSMOTIC RELEASE 60 MG PO TB24
60.0000 mg | ORAL_TABLET | Freq: Every day | ORAL | Status: DC
  Administered 2024-10-20: 60 mg via ORAL
  Filled 2024-10-20 (×2): qty 1

## 2024-10-20 MED ORDER — LIDOCAINE HCL (PF) 1 % IJ SOLN
INTRAMUSCULAR | Status: DC | PRN
  Administered 2024-10-20: 5 mL

## 2024-10-20 MED ORDER — LIDOCAINE HCL (PF) 1 % IJ SOLN
INTRAMUSCULAR | Status: AC
  Filled 2024-10-20: qty 30

## 2024-10-20 MED ORDER — SACUBITRIL-VALSARTAN 97-103 MG PO TABS
1.0000 | ORAL_TABLET | Freq: Two times a day (BID) | ORAL | Status: AC
  Administered 2024-10-20 – 2024-10-23 (×8): 1 via ORAL
  Filled 2024-10-20 (×8): qty 1

## 2024-10-20 MED ORDER — AMIODARONE HCL 200 MG PO TABS
200.0000 mg | ORAL_TABLET | Freq: Two times a day (BID) | ORAL | Status: AC
  Administered 2024-10-20 – 2024-10-25 (×12): 200 mg via ORAL
  Filled 2024-10-20 (×12): qty 1

## 2024-10-20 MED ORDER — FUROSEMIDE 10 MG/ML IJ SOLN
120.0000 mg | Freq: Two times a day (BID) | INTRAVENOUS | Status: DC
  Administered 2024-10-20: 120 mg via INTRAVENOUS
  Filled 2024-10-20 (×2): qty 12
  Filled 2024-10-20: qty 10

## 2024-10-20 MED ORDER — EMPAGLIFLOZIN 10 MG PO TABS
10.0000 mg | ORAL_TABLET | Freq: Every day | ORAL | Status: DC
  Administered 2024-10-20: 10 mg via ORAL
  Filled 2024-10-20 (×2): qty 1

## 2024-10-20 MED ORDER — HEPARIN (PORCINE) IN NACL 1000-0.9 UT/500ML-% IV SOLN
INTRAVENOUS | Status: DC | PRN
  Administered 2024-10-20: 500 mL

## 2024-10-20 NOTE — Progress Notes (Signed)
 Patient ID: Jonathan Fowler, male   DOB: July 18, 1956, 69 y.o.   MRN: [id number]     Advanced Heart Failure Rounding Note  HF Cardiologist: Dr. Rolan  Chief Complaint: CHF  Subjective:   Co-ox 59% on 0.25 milrinone  with Fick CI 2.2 L/min/m2.  CVP 10. 3.6L UOP last 24 hrs with 120 IV lasix  BID + 2.5 mg metolazone .  Feels better on milrinone . Less short of breath and appetite improved. Reports headache.   Objective:   Weight Range: 100.2 kg Body mass index is 30.81 kg/m.   Vital Signs:   Temp:  [97.6 F (36.4 C)-98.1 F (36.7 C)] 97.6 F (36.4 C) (01/07 0300) Pulse Rate:  [65-87] 66 (01/07 0300) Resp:  [14-25] 25 (01/07 0300) BP: (106-150)/(63-112) 136/63 (01/07 0300) SpO2:  [91 %-100 %] 91 % (01/07 0300) Weight:  [99.7 kg-101.2 kg] 100.2 kg (01/07 0300)    Weight change: Filed Weights   10/19/24 1100 10/19/24 1800 10/20/24 0300  Weight: 99.7 kg 99.7 kg 100.2 kg    Intake/Output:   Intake/Output Summary (Last 24 hours) at 10/20/2024 0703 Last data filed at 10/20/2024 0300 Gross per 24 hour  Intake 346.39 ml  Output 3675 ml  Net -3328.61 ml     Physical Exam   General:  Sitting up in bed. Chronically ill appearing. Neck: JVP 10-12 Cor: Regular rate & rhythm. No murmurs. Lungs: clear Abdomen: soft, nontender, nondistended. Extremities: no edema Neuro: alert & orientedx3. Affect pleasant   Telemetry   SR with very frequent PVCs (about 50% burden)  Labs   CBC Recent Labs    10/18/24 1700 10/18/24 1850 10/20/24 0513  WBC 6.4  --  4.9  HGB 14.4 15.3 12.2*  HCT 43.5 45.0 35.6*  MCV 94.8  --  91.8  PLT 148*  --  103*   Basic Metabolic Panel Recent Labs    98/93/73 0344 10/19/24 2045 10/20/24 0513  NA 143 140 138  K 3.0* 3.1* 2.8*  CL 99 100 96*  CO2 31 29 31   GLUCOSE 96 155* 118*  BUN 39* 38* 37*  CREATININE 2.12* 2.28* 2.24*  CALCIUM  9.1 7.8* 8.7*  MG 2.1  --  2.1   Liver Function Tests Recent Labs    10/18/24 1439  AST 34  ALT  26  ALKPHOS 88  BILITOT 1.8*  PROT 6.4*  ALBUMIN 4.2   Recent Labs    10/18/24 1439  LIPASE 48   Cardiac Enzymes No results for input(s): CKTOTAL, CKMB, CKMBINDEX, TROPONINI in the last 72 hours.  BNP: BNP (last 3 results) Recent Labs    06/29/24 1412 08/30/24 1923 09/27/24 0953  BNP 1,746.1* >4,500.0* >4,500.0*    ProBNP (last 3 results) Recent Labs    10/18/24 1439  PROBNP 19,343.0*     D-Dimer No results for input(s): DDIMER in the last 72 hours. Hemoglobin A1C No results for input(s): HGBA1C in the last 72 hours. Fasting Lipid Panel No results for input(s): CHOL, HDL, LDLCALC, TRIG, CHOLHDL, LDLDIRECT in the last 72 hours. Medications:   Scheduled Medications:  aspirin  EC  81 mg Oral Daily   atorvastatin   10 mg Oral Daily   Chlorhexidine  Gluconate Cloth  6 each Topical Daily   heparin   5,000 Units Subcutaneous Q8H   hydrALAZINE   25 mg Oral Q8H   isosorbide  mononitrate  30 mg Oral Daily   potassium chloride   40 mEq Oral Q4H   sacubitril -valsartan   1 tablet Oral BID   sodium chloride  flush  10-40 mL  Intracatheter Q12H   sodium chloride  flush  3 mL Intravenous Q12H   sodium chloride  flush  3 mL Intravenous Q12H   spironolactone   25 mg Oral Daily    Infusions:  sodium chloride      furosemide  120 mg (10/19/24 2041)   milrinone  0.25 mcg/kg/min (10/19/24 2152)    PRN Medications: sodium chloride , acetaminophen , ondansetron  (ZOFRAN ) IV, sodium chloride  flush, sodium chloride  flush, sodium chloride  flush  Assessment/Plan   1. Acute on chronic systolic CHF: Biventricular failure, suspect nearing end stage.  NICM.  Cath 9/25 with nonobstructive CAD and preserved CI.  cMRI 9/25 with LV EF 16%, RV EF 19%, basal septal LGE (nonspecific, seen with dilated CMP).  Echo in 12/25 with EF 18%, severe LV dilation, severe RV dysfunction, severe biatrial enlargement, IVC dilated. CPX with moderate-severe functional impairment due to HF. NYHA class  IV on admission with lactic acid of 2.    BP elevated but pulse pressure narrow. He has been taking torsemide  40 qam/20 qpm at home.   - Co-ox 59% on 0.25 milrinone . Fick CI 2.2.  - CVP 10. Continue IV lasix  120 BID, but need to supp K before next dose - Hold Coreg . - Increase Entresto  to 97/103 mg BID - Continue spironolactone  25 daily. - Holding Jardiance .  - Stop imdur /hydralazine . Addition of Imdur  may be contributing to headache. - Worry he may have end stage biventricular cardiomyopathy.  Concerned he won't be a very good LVAD candidate with significant RV failure and elevated creatinine.  Heart/kidney transplant would be a consideration but he will have a narrow window for this.  - He has a wide RBBB, probably not going to get marked improvement from CRT.  - RHC today to definitively assess hemodynamics  2. CKD stage 3b: Creatinine stable at 2.2, this seems to be around his baseline for at least the last month.  Suspect cardiorenal syndrome.  - May improve with inotrope   3. DM2: SSI.   4. HTN: Meds as above.   5. Hypokalemia: K 2.8, Mag 2.1 - Supp aggressively  6. Iron  deficiency anemia - T sat 17% - Give IV iron , discussed dosing with HF PharmD.  7. PVCs - Very frequent - May be 2/2 inotrope support - Start po amiodarone  - Supp K   Length of Stay: 2  Gianina Olinde N, PA-C  10/20/2024, 7:03 AM  Advanced Heart Failure Team Pager [phone] (M-F; 7a - 5p)   Please visit [email or web address]: For overnight coverage please call cardiology fellow first. If fellow not available call Shock/ECMO MD on call.  For ECMO / Mechanical Support (Impella, IABP, LVAD) issues call Shock / ECMO MD on call.

## 2024-10-20 NOTE — Interval H&P Note (Signed)
 History and Physical Interval Note:  10/20/2024 11:26 AM  Jonathan Fowler  has presented today for surgery, with the diagnosis of heart failure.  The various methods of treatment have been discussed with the patient and family. After consideration of risks, benefits and other options for treatment, the patient has consented to  Procedures: RIGHT HEART CATH (N/A) as a surgical intervention.  The patient's history has been reviewed, patient examined, no change in status, stable for surgery.  I have reviewed the patient's chart and labs.  Questions were answered to the patient's satisfaction.     Morene JINNY Brownie

## 2024-10-20 NOTE — Plan of Care (Signed)

## 2024-10-20 NOTE — Plan of Care (Signed)

## 2024-10-20 NOTE — TOC Progression Note (Signed)
 Transition of Care [facility]) - Progression Note    Patient Details  Name: Jonathan Fowler MRN: [id number] Date of Birth: 06-08-1956  Transition of Care [facility] CM/SW Contact  Waddell Barnie Rama, RN Phone Number: 10/20/2024, 4:30 PM  Clinical Narrative:    Per Arlana , HF team wants patient to go home with milrinone , this NCM notified Holley Herring of this information.                      Expected Discharge Plan and Services                                               Social Drivers of Health (SDOH) Interventions SDOH Screenings   Food Insecurity: No Food Insecurity (10/19/2024)  Housing: Low Risk (10/19/2024)  Transportation Needs: No Transportation Needs (10/19/2024)  Utilities: Not At Risk (10/19/2024)  Depression (PHQ2-9): Low Risk (08/02/2024)  Social Connections: Moderately Isolated (10/19/2024)  Tobacco Use: Low Risk (10/18/2024)    Readmission Risk Interventions    07/07/2024   11:26 AM  Readmission Risk Prevention Plan  Post Dischage Appt Complete  Medication Screening Complete  Transportation Screening Complete

## 2024-10-21 DIAGNOSIS — I4510 Unspecified right bundle-branch block: Secondary | ICD-10-CM

## 2024-10-21 LAB — BASIC METABOLIC PANEL WITH GFR
Anion gap: 9 (ref 5–15)
Anion gap: 8 (ref 5–15)
BUN: 32 mg/dL — ABNORMAL HIGH (ref 8–23)
BUN: 32 mg/dL — ABNORMAL HIGH (ref 8–23)
CO2: 32 mmol/L (ref 22–32)
CO2: 34 mmol/L — ABNORMAL HIGH (ref 22–32)
Calcium: 8.7 mg/dL — ABNORMAL LOW (ref 8.9–10.3)
Calcium: 8.8 mg/dL — ABNORMAL LOW (ref 8.9–10.3)
Chloride: 98 mmol/L (ref 98–111)
Chloride: 95 mmol/L — ABNORMAL LOW (ref 98–111)
Creatinine, Ser: 2.11 mg/dL — ABNORMAL HIGH (ref 0.61–1.24)
Creatinine, Ser: 2.33 mg/dL — ABNORMAL HIGH (ref 0.61–1.24)
GFR, Estimated: 33 mL/min — ABNORMAL LOW
GFR, Estimated: 30 mL/min — ABNORMAL LOW
Glucose, Bld: 112 mg/dL — ABNORMAL HIGH (ref 70–99)
Glucose, Bld: 175 mg/dL — ABNORMAL HIGH (ref 70–99)
Potassium: 3.3 mmol/L — ABNORMAL LOW (ref 3.5–5.1)
Potassium: 3.6 mmol/L (ref 3.5–5.1)
Sodium: 139 mmol/L (ref 135–145)
Sodium: 136 mmol/L (ref 135–145)

## 2024-10-21 LAB — CBC
HCT: 35.3 % — ABNORMAL LOW (ref 39.0–52.0)
Hemoglobin: 11.9 g/dL — ABNORMAL LOW (ref 13.0–17.0)
MCH: 30.9 pg (ref 26.0–34.0)
MCHC: 33.7 g/dL (ref 30.0–36.0)
MCV: 91.7 fL (ref 80.0–100.0)
Platelets: 119 K/uL — ABNORMAL LOW (ref 150–400)
RBC: 3.85 MIL/uL — ABNORMAL LOW (ref 4.22–5.81)
RDW: 15.9 % — ABNORMAL HIGH (ref 11.5–15.5)
WBC: 5.1 K/uL (ref 4.0–10.5)
nRBC: 0 % (ref 0.0–0.2)

## 2024-10-21 LAB — SURGICAL PCR SCREEN
MRSA, PCR: NEGATIVE
Staphylococcus aureus: NEGATIVE

## 2024-10-21 LAB — COOXEMETRY PANEL
Carboxyhemoglobin: 2.3 % — ABNORMAL HIGH (ref 0.5–1.5)
Carboxyhemoglobin: 1.9 % — ABNORMAL HIGH (ref 0.5–1.5)
Carboxyhemoglobin: 1.6 % — ABNORMAL HIGH (ref 0.5–1.5)
Methemoglobin: <0.7 % (ref 0.0–1.5)
Methemoglobin: <0.7 % (ref 0.0–1.5)
Methemoglobin: 0.7 % (ref 0.0–1.5)
O2 Saturation: 81.6 %
O2 Saturation: 68.9 %
O2 Saturation: 69.8 %
Total hemoglobin: 12.4 g/dL (ref 12.0–16.0)
Total hemoglobin: 12.4 g/dL (ref 12.0–16.0)
Total hemoglobin: 13 g/dL (ref 12.0–16.0)

## 2024-10-21 LAB — MAGNESIUM: Magnesium: 1.9 mg/dL (ref 1.7–2.4)

## 2024-10-21 MED ORDER — SODIUM CHLORIDE 0.9% FLUSH: 3.0000 mL | INTRAVENOUS | Status: DC | PRN

## 2024-10-21 MED ORDER — SODIUM CHLORIDE 0.9% FLUSH
3.0000 mL | Freq: Two times a day (BID) | INTRAVENOUS | Status: DC
  Administered 2024-10-21 – 2024-10-26 (×7): 3 mL via INTRAVENOUS

## 2024-10-21 MED ORDER — ALUM & MAG HYDROXIDE-SIMETH 200-200-20 MG/5ML PO SUSP
30.0000 mL | Freq: Four times a day (QID) | ORAL | Status: DC | PRN
  Administered 2024-10-21 – 2024-10-22 (×2): 30 mL via ORAL
  Filled 2024-10-21 (×2): qty 30

## 2024-10-21 MED ORDER — FUROSEMIDE 10 MG/ML IJ SOLN
120.0000 mg | Freq: Two times a day (BID) | INTRAVENOUS | Status: AC
  Administered 2024-10-21: 120 mg via INTRAVENOUS
  Filled 2024-10-21: qty 12

## 2024-10-21 MED ORDER — CEFAZOLIN SODIUM-DEXTROSE 2-4 GM/100ML-% IV SOLN: 2.0000 g | INTRAVENOUS | Status: AC

## 2024-10-21 MED ORDER — CHLORHEXIDINE GLUCONATE 4 % EX SOLN
60.0000 mL | Freq: Once | CUTANEOUS | Status: AC
  Administered 2024-10-22: 4 via TOPICAL
  Filled 2024-10-21: qty 15

## 2024-10-21 MED ORDER — POTASSIUM CHLORIDE CRYS ER 20 MEQ PO TBCR
40.0000 meq | EXTENDED_RELEASE_TABLET | ORAL | Status: AC
  Administered 2024-10-21 (×2): 40 meq via ORAL
  Filled 2024-10-21 (×2): qty 2

## 2024-10-21 MED ORDER — MAGNESIUM SULFATE 2 GM/50ML IV SOLN
2.0000 g | Freq: Once | INTRAVENOUS | Status: AC
  Administered 2024-10-21: 2 g via INTRAVENOUS
  Filled 2024-10-21: qty 50

## 2024-10-21 MED ORDER — SODIUM CHLORIDE 0.9 % IV SOLN
80.0000 mg | INTRAVENOUS | Status: AC
  Filled 2024-10-21: qty 2

## 2024-10-21 MED ORDER — SODIUM CHLORIDE 0.9 % IV SOLN: INTRAVENOUS | Status: AC

## 2024-10-21 NOTE — Plan of Care (Signed)

## 2024-10-21 NOTE — Progress Notes (Signed)
 Patient ID: Jonathan Fowler, male   DOB: 1956-09-08, 69 y.o.   MRN: [id number]     Advanced Heart Failure Rounding Note  HF Cardiologist: Dr. Zenaida  Chief Complaint: CHF  Subjective:   RHC 10/20/24 on 0.25 milrinone : RA mean 13, PA 54/21 (34), PCWP mean 27, CO/CI (TD) 5.1/2.3  CVP 8. ? Co-ox 81% on 0.25 milrinone . 5.5L UOP last 24 hrs with IV lasix  120 BID. Doubt weight is accurate.  SBP down to 90s with aggressive titration of medical therapy.  Feels the best he has in weeks. Able to eat all of his meals without GI distress. Shortness of breath improved.   Objective:   Weight Range: 100.2 kg Body mass index is 30.81 kg/m.   Vital Signs:   Temp:  [97.7 F (36.5 C)-98.1 F (36.7 C)] 97.9 F (36.6 C) (01/08 0351) Pulse Rate:  [62-82] 62 (01/08 0514) Resp:  [17-27] 17 (01/08 0351) BP: (82-142)/(53-96) 99/59 (01/08 0600) SpO2:  [90 %-98 %] 98 % (01/08 0514) Weight:  [100.2 kg] 100.2 kg (01/08 0418) Last BM Date : 10/20/24  Weight change: Filed Weights   10/19/24 1800 10/20/24 0300 10/21/24 0418  Weight: 99.7 kg 100.2 kg 100.2 kg    Intake/Output:   Intake/Output Summary (Last 24 hours) at 10/21/2024 0649 Last data filed at 10/21/2024 0558 Gross per 24 hour  Intake 905.15 ml  Output 5550 ml  Net -4644.85 ml     Physical Exam   General:  Sitting up in bed. Neck: JVP 8-10 Cor: Regular rate & rhythm. No murmurs. Lungs: clear Abdomen: soft, nondistended. Extremities: no edema Neuro: alert & orientedx3. Affect pleasant    Telemetry   SR with improved PVCs  Labs   CBC Recent Labs    10/20/24 0513 10/20/24 1148 10/20/24 1149 10/21/24 0505  WBC 4.9  --   --  5.1  HGB 12.2*   < > 12.9* 11.9*  HCT 35.6*   < > 38.0* 35.3*  MCV 91.8  --   --  91.7  PLT 103*  --   --  119*   < > = values in this interval not displayed.   Basic Metabolic Panel Recent Labs    98/92/73 0513 10/20/24 1148 10/20/24 2300 10/21/24 0505  NA 138   < > 137 139  K 2.8*   <  > 3.4* 3.3*  CL 96*  --  96* 98  CO2 31  --  33* 32  GLUCOSE 118*  --  188* 112*  BUN 37*  --  34* 32*  CREATININE 2.24*  --  2.10* 2.11*  CALCIUM  8.7*  --  8.6* 8.7*  MG 2.1  --   --  1.9   < > = values in this interval not displayed.   Liver Function Tests Recent Labs    10/18/24 1439  AST 34  ALT 26  ALKPHOS 88  BILITOT 1.8*  PROT 6.4*  ALBUMIN 4.2   Recent Labs    10/18/24 1439  LIPASE 48   Cardiac Enzymes No results for input(s): CKTOTAL, CKMB, CKMBINDEX, TROPONINI in the last 72 hours.  BNP: BNP (last 3 results) Recent Labs    06/29/24 1412 08/30/24 1923 09/27/24 0953  BNP 1,746.1* >4,500.0* >4,500.0*    ProBNP (last 3 results) Recent Labs    10/18/24 1439  PROBNP 19,343.0*     D-Dimer No results for input(s): DDIMER in the last 72 hours. Hemoglobin A1C No results for input(s): HGBA1C in the last 72 hours.  Fasting Lipid Panel No results for input(s): CHOL, HDL, LDLCALC, TRIG, CHOLHDL, LDLDIRECT in the last 72 hours. Medications:   Scheduled Medications:  amiodarone   200 mg Oral BID   aspirin  EC  81 mg Oral Daily   atorvastatin   10 mg Oral Daily   Chlorhexidine  Gluconate Cloth  6 each Topical Daily   empagliflozin   10 mg Oral Daily   heparin   5,000 Units Subcutaneous Q8H   NIFEdipine   60 mg Oral Daily   sacubitril -valsartan   1 tablet Oral BID   sodium chloride  flush  10-40 mL Intracatheter Q12H   sodium chloride  flush  3 mL Intravenous Q12H   spironolactone   25 mg Oral Daily    Infusions:  furosemide  120 mg (10/20/24 1759)   milrinone  0.25 mcg/kg/min (10/21/24 0352)    PRN Medications: acetaminophen , ondansetron  (ZOFRAN ) IV, sodium chloride  flush, sodium chloride  flush  Assessment/Plan   1. Acute on chronic systolic CHF: Biventricular failure, suspect nearing end stage.  NICM.  Cath 9/25 with nonobstructive CAD and preserved CI.  cMRI 9/25 with LV EF 16%, RV EF 19%, basal septal LGE (nonspecific, seen with  dilated CMP).  Echo in 12/25 with EF 18%, severe LV dilation, severe RV dysfunction, severe biatrial enlargement, IVC dilated. CPX with moderate-severe functional impairment due to HF. NYHA class IV on admission with lactic acid of 2.    RHC 10/20/24 on 0.25 milrinone : RA mean 13, PA 54/21 (34), PCWP mean 27, CO/CI (TD) 5.1/2.3.  - Has wide RBBB (QRS > 200 ms), may not get substantial improvement from CRT-D but will discuss with EP. - he's had substantial symptom improvement with inotrope support. May need to discharge with home inotrope. - Co-ox 81% on 0.25 milrinone . 69% on recheck.  - Wean milrinone  to 0.125 mcg/kg/min - CVP 8. Give 1 more dose IV lasix  then plan to transition to po tomorrow - Continue to hold beta blocker - Continue Entresto  97/103 mg BID - Continue spironolactone  25 daily. - Continue Jardiance .  - Headache with imdur /hydralazine .  - Now on nifedipine  for blood pressure, will hold this am given soft BP - Worry he may have end stage biventricular cardiomyopathy.  Concerned he won't be a very good LVAD candidate with significant RV failure and elevated creatinine.  Heart/kidney transplant would be a consideration but he will have a narrow window for this.   2. CKD stage 3b: Creatinine stable at 2.1, this seems to be around his baseline for at least the last month.   - Suspect cardiorenal syndrome.   3. DM2: SSI.   4. HTN: BP significantly lower today with titration of GDMT  5. Hypokalemia: K 3.3 - Supp aggressively - May need to increase spironolactone   6. Iron  deficiency anemia - T sat 17% - Given venofer   7. PVCs - May be 2/2 inotrope support - Started po amiodarone  200 BID - Burden improved today - Supp K and mag  Mobilize today  Length of Stay: 3  Jia Mohamed N, PA-C  10/21/2024, 6:49 AM  Advanced Heart Failure Team Pager [phone] (M-F; 7a - 5p)   Please visit [email or web address]: For overnight coverage please call cardiology fellow first. If fellow not  available call Shock/ECMO MD on call.  For ECMO / Mechanical Support (Impella, IABP, LVAD) issues call Shock / ECMO MD on call.

## 2024-10-21 NOTE — TOC Initial Note (Signed)
 Transition of Care [facility]) - Initial/Assessment Note    Patient Details  Name: Jonathan Fowler MRN: [id number] Date of Birth: [birth year]  [facility]) CM/SW Contact:    Arlana JINNY Nicholaus ISRAEL Phone Number: [phone] 10/21/2024, 4:15 PM  Clinical Narrative:    HF CSW met with patient at bedside. Patient stated that he lives at home with spouse. Patient stated that he has no history of HH services. Patient stated that he uses a bipap. Patient stated that he has a scale at home. Patient stated that he has a PCP. CSW explained that a hospital follow up appointment scheduled closer towards dc. Patient is agreeable. Spouse will provide transportation at dc.   HF CSW/CM will continue to follow and monitor for dc readiness.                      Patient Goals and CMS Choice            Expected Discharge Plan and Services                                              Prior Living Arrangements/Services                       Activities of Daily Living   ADL Screening (condition at time of admission) Independently performs ADLs?: Yes (appropriate for developmental age) Is the patient deaf or have difficulty hearing?: No Does the patient have difficulty seeing, even when wearing glasses/contacts?: No Does the patient have difficulty concentrating, remembering, or making decisions?: No  Permission Sought/Granted                  Emotional Assessment              Admission diagnosis:  Acute on chronic systolic heart failure (HCC) [I50.23] Acute congestive heart failure, unspecified heart failure type [facility]) [I50.9] Patient Active Problem List   Diagnosis Date Noted   Acute on chronic systolic heart failure (HCC) 10/18/2024   Iron  deficiency anemia 07/02/2024   Essential hypertension 06/30/2024   Obesity, class 1 06/30/2024   Acute on chronic systolic CHF (congestive heart failure) (HCC) 06/29/2024   Chronic kidney disease, stage 3a  (HCC) 06/29/2024   RBBB 06/29/2024   QT prolongation 06/29/2024   Type 2 diabetes mellitus with hyperlipidemia (HCC) 06/29/2024   Olecranon bursitis of left elbow 08/25/2017   PCP:  Regino Slater, MD Pharmacy:   [facility] [phone] - [place], [place] - [address] [place] [address] Phone: [phone] Fax: [phone]  [facility] [address] [place] [address] Phone: [phone] Fax: [phone]     Social Drivers of Health (SDOH) Social History: SDOH Screenings   Food Insecurity: No Food Insecurity (10/19/2024)  Housing: Low Risk (10/19/2024)  Transportation Needs: No Transportation Needs (10/19/2024)  Utilities: Not At Risk (10/19/2024)  Depression (PHQ2-9): Low Risk (08/02/2024)  Social Connections: Moderately Isolated (10/19/2024)  Tobacco Use: Low Risk (10/18/2024)   SDOH Interventions:     Readmission Risk Interventions    07/07/2024   11:26 AM  Readmission Risk Prevention Plan  Post Dischage Appt Complete  Medication Screening Complete  Transportation Screening Complete

## 2024-10-21 NOTE — Consult Note (Addendum)
 "   ELECTROPHYSIOLOGY CONSULT NOTE    Patient ID: Jonathan Fowler MRN: [id number], DOB/AGE: 1956-07-03 69 y.o.  Admit date: 10/18/2024 Date of Consult: 10/21/2024  Primary Physician: Regino Slater, MD Primary Cardiologist: None  Electrophysiologist: Dr. Inocencio   Referring Provider: Dr. Zenaida  Patient Profile: Jonathan Fowler is a 69 y.o. male with a history of acute on chronic biventricular heart failure, HTN and CKD III  who is being seen today for the evaluation of possible CRT-D implant at the request of Dr. Zenaida.  HPI:  Jonathan Fowler is a 69 y.o. male with above noted medical history who was first diagnosed with systolic heart failure during September 2025 admission. EF that that time <20% and AHF team was consulted. RHC 07/02/2024 with severely elevated biventricular filling pressures and reduced cardiac index. cMRI with NiCM BiV HF, LVEF 15%. As renal function improved, underwent [facility] 07/05/24 with nonobs CAD, elevated filling pressures and preserved CO. Required further diuresis and GDMT titrated. He was discharged home, weight 213 lbs.   Repeat echo 12/25 again showed EF <20%, biplan 18.4%, G3DD, severely reduced RV function, BAE.  Patient seen in HF clinic on 10/19/23 with NYHA IV symptoms, dyspnea, bloating, nausea, cool extremities. With symptoms and increased weight, patient sent to the ED for evaluation of low output HF. Patient found with Co-ox 51% with lactate 2.0, started on Milrinone . RHC on 01/07 with preserved CO on 0.25 Milrinone . Symptomatically improved with diuresis and inotrope support along with afterload reduction. This morning patient continuing to show improvement, Milrinone  now being titrated down. EP asked to see patient today regarding possible CRT-D implant.   On exam, patient confirms significant improvement in symptoms compared with how he felt at time of admission. Reports no dyspnea with supine positioning. No other symptoms reported today.     Labs Potassium3.3* (01/08 0505) Magnesium   1.9 (01/08 0505) Creatinine, ser  2.11* (01/08 0505) PLT  119* (01/08 0505) HGB  11.9* (01/08 0505) WBC 5.1 (01/08 0505)  .    Past Medical History:  Diagnosis Date   High cholesterol    Hypertension    Vertigo      Surgical History:  Past Surgical History:  Procedure Laterality Date   RIGHT HEART CATH N/A 07/02/2024   Procedure: RIGHT HEART CATH;  Surgeon: Gardenia Led, DO;  Location: [facility] INVASIVE CV LAB;  Service: Cardiovascular;  Laterality: N/A;   RIGHT/LEFT HEART CATH AND CORONARY ANGIOGRAPHY N/A 07/05/2024   Procedure: RIGHT/LEFT HEART CATH AND CORONARY ANGIOGRAPHY;  Surgeon: Rolan Ezra RAMAN, MD;  Location: [facility] INVASIVE CV LAB;  Service: Cardiovascular;  Laterality: N/A;     Medications Prior to Admission  Medication Sig Dispense Refill Last Dose/Taking   aspirin  81 MG chewable tablet Chew 1 tablet (81 mg total) by mouth daily. 90 tablet 3 10/18/2024   atorvastatin  (LIPITOR) 10 MG tablet Take 1 tablet (10 mg total) by mouth daily. 90 tablet 3 10/18/2024   carvedilol  (COREG ) 3.125 MG tablet Take 1 tablet (3.125 mg total) by mouth 2 (two) times daily with a meal. 180 tablet 3 10/18/2024   Cholecalciferol (VITAMIN D3) 50 MCG (2000 UT) capsule Take 2,000 Units by mouth daily.   10/17/2024   empagliflozin  (JARDIANCE ) 10 MG TABS tablet Take 1 tablet (10 mg total) by mouth daily. 30 tablet 11 10/18/2024   glucosamine-chondroitin 500-400 MG tablet Take 1 tablet by mouth in the morning and at bedtime.   Past Week   metFORMIN (GLUCOPHAGE) 500 MG tablet Take 500 mg  by mouth in the morning and at bedtime.   10/18/2024 Morning   Multiple Vitamins-Minerals (CENTRUM SILVER 50+MEN) TABS Take 1 tablet by mouth daily.   Past Week   potassium chloride  SA (KLOR-CON  M) 20 MEQ tablet Take 1 tablet (20 mEq total) by mouth 2 (two) times daily.   10/17/2024   sacubitril -valsartan  (ENTRESTO ) 97-103 MG Take 1 tablet by mouth 2 (two) times daily. 60 tablet 11  10/18/2024 Morning   spironolactone  (ALDACTONE ) 25 MG tablet Take 1 tablet (25 mg total) by mouth daily. 90 tablet 3 10/18/2024   torsemide  (DEMADEX ) 20 MG tablet Take 1 tablet (20 mg total) by mouth daily. (Patient taking differently: Take 20-40 mg by mouth See admin instructions. Takes 2 in the AM and 1 at night.)   10/18/2024 Morning   methocarbamol (ROBAXIN) 500 MG tablet Take 500 mg by mouth at bedtime as needed for muscle spasms (sleep). (Patient not taking: Reported on 10/18/2024)   Not Taking    Inpatient Medications:   amiodarone   200 mg Oral BID   aspirin  EC  81 mg Oral Daily   atorvastatin   10 mg Oral Daily   Chlorhexidine  Gluconate Cloth  6 each Topical Daily   empagliflozin   10 mg Oral Daily   heparin   5,000 Units Subcutaneous Q8H   potassium chloride   40 mEq Oral Q4H   sacubitril -valsartan   1 tablet Oral BID   sodium chloride  flush  10-40 mL Intracatheter Q12H   sodium chloride  flush  3 mL Intravenous Q12H   spironolactone   25 mg Oral Daily    Allergies: Allergies[1]  History reviewed. No pertinent family history.   Physical Exam: Vitals:   10/21/24 0514 10/21/24 0527 10/21/24 0600 10/21/24 0809  BP: (!) 85/54 (!) 93/55 (!) 99/59 (!) 94/56  Pulse: 62   68  Resp:    19  Temp:    98.3 F (36.8 C)  TempSrc:    Oral  SpO2: 98%   93%  Weight:    100.1 kg  Height:        GEN- NAD, A&O x 3, normal affect HEENT: Normocephalic, atraumatic Lungs- CTAB, Normal effort.  Heart- regular with occasional ectopy rate and rhythm, No M/G/R.  GI- Soft, NT, ND.  Extremities- No clubbing, cyanosis, or edema   Radiology/Studies: US  EKG SITE RITE Result Date: 10/18/2024 If Site Rite image not attached, placement could not be confirmed due to current cardiac rhythm.  DG Chest 2 View Result Date: 10/18/2024 EXAM: 2 VIEW(S) XRAY OF THE CHEST 10/18/2024 02:48:00 PM COMPARISON: 08/30/2024 CLINICAL HISTORY: SOB FINDINGS: LUNGS AND PLEURA: Mild right hemidiaphragm elevation. Mild pulmonary  interstitial prominence. No focal pulmonary opacity. No pleural effusion. No pneumothorax. HEART AND MEDIASTINUM: Mild cardiomegaly. No acute abnormality of the mediastinal silhouette. BONES AND SOFT TISSUES: Right shoulder prosthesis noted. No acute osseous abnormality. IMPRESSION: 1. Mild cardiomegaly and pulmonary interstitial prominence, favoring pulmonary venous congestion. No overt congestive heart failure. Electronically signed by: Rockey Kilts MD 10/18/2024 03:35 PM EST RP Workstation: [id number]   ECHOCARDIOGRAM LIMITED Result Date: 10/08/2024    ECHOCARDIOGRAM LIMITED REPORT   Patient Name:   Jonathan Fowler Date of Exam: 10/08/2024 Medical Rec #:  [id number]          Height:       71.0 in Accession #:    [id number]         Weight:       219.0 lb Date of Birth:  Aug 23, 1956  BSA:          2.191 m Patient Age:    68 years           BP:           140/80 mmHg Patient Gender: M                  HR:           75 bpm. Exam Location:  Outpatient Procedure: Limited Echo, Cardiac Doppler and Strain Analysis (Both Spectral and            Color Flow Doppler were utilized during procedure). Indications:    I50.9 Congestive Heart Failure  History:        Patient has prior history of Echocardiogram examinations. CHF,                 CKD III; Risk Factors:Hypertension and Dyslipidemia.  Sonographer:    L. Thornton-Maynard, RDCS Referring Phys: [id number] BENJAMIN J STONER IMPRESSIONS  1. LVOT VTI 6 cm suggestive of low outpatient state. No evidence of LV thrombus.. Left ventricular ejection fraction, by estimation, is <20%. Left ventricular ejection fraction by 2D MOD biplane is 18.4 %. The left ventricle has severely decreased function. The left ventricle demonstrates global hypokinesis. The left ventricular internal cavity size was severely dilated. There is severe eccentric left ventricular hypertrophy. Left ventricular diastolic parameters are consistent with Grade III diastolic dysfunction (restrictive).  Elevated left atrial pressure. The average left ventricular global longitudinal strain is -2.8 %. The global longitudinal strain is abnormal.  2. Right ventricular systolic function is severely reduced. The right ventricular size is severely enlarged.  3. Left atrial size was severely dilated.  4. Right atrial size was severely dilated.  5. The inferior vena cava is dilated in size with <50% respiratory variability, suggesting right atrial pressure of 15 mmHg. FINDINGS  Left Ventricle: LVOT VTI 6 cm suggestive of low outpatient state. No evidence of LV thrombus. Left ventricular ejection fraction, by estimation, is <20%. Left ventricular ejection fraction by 2D MOD biplane is 18.4 %. The left ventricle has severely decreased function. The left ventricle demonstrates global hypokinesis. The average left ventricular global longitudinal strain is -2.8 %. Strain was performed and the global longitudinal strain is abnormal. The left ventricular internal cavity size was severely dilated. There is severe eccentric left ventricular hypertrophy. Left ventricular diastolic parameters are consistent with Grade III diastolic dysfunction (restrictive). Elevated left atrial pressure. Right Ventricle: The right ventricular size is severely enlarged. Right ventricular systolic function is severely reduced. Left Atrium: Left atrial size was severely dilated. Right Atrium: Right atrial size was severely dilated. Aortic Valve: Aortic valve mean gradient measures 3.0 mmHg. Aortic valve peak gradient measures 5.3 mmHg. Aortic valve area, by VTI measures 1.44 cm. Venous: The inferior vena cava is dilated in size with less than 50% respiratory variability, suggesting right atrial pressure of 15 mmHg. IAS/Shunts: No atrial level shunt detected by color flow Doppler. LEFT VENTRICLE PLAX 2D                        Biplane EF (MOD) LVIDd:         6.50 cm         LV Biplane EF:   Left LVIDs:         6.10 cm  ventricular  LV PW:         1.30 cm                          ejection LV IVS:        1.30 cm                          fraction by LVOT diam:     2.30 cm                          2D MOD LV SV:         28                               biplane is LV SV Index:   13                               18.4 %. LVOT Area:     4.15 cm                                Diastology                                LV e' medial:   3.20 cm/s LV Volumes (MOD)               LV E/e' medial: 25.0 LV vol d, MOD    272.0 ml A2C:                           2D Longitudinal LV vol d, MOD    252.0 ml      Strain A4C:                           2D Strain GLS   -3.6 % LV vol s, MOD    210.0 ml      (A4C): A2C:                           2D Strain GLS   -1.7 % LV vol s, MOD    204.0 ml      (A3C): A4C:                           2D Strain GLS   -3.1 % LV SV MOD A2C:   62.0 ml       (A2C): LV SV MOD A4C:   252.0 ml      2D Strain GLS   -2.8 % LV SV MOD BP:    49.2 ml       Avg: LEFT ATRIUM            Index        RIGHT ATRIUM           Index LA Vol (A2C): 128.3 ml 58.56 ml/m  RA Area:     25.90 cm 11.82 cm/m LA Vol (A4C): 95.4 ml  43.51 ml/m  RA Volume:   103.57 ml 47.26 ml/m  AORTIC  VALVE AV Area (Vmax):    1.63 cm AV Area (Vmean):   1.50 cm AV Area (VTI):     1.44 cm AV Vmax:           115.00 cm/s AV Vmean:          84.200 cm/s AV VTI:            0.194 m AV Peak Grad:      5.3 mmHg AV Mean Grad:      3.0 mmHg LVOT Vmax:         45.00 cm/s LVOT Vmean:        30.500 cm/s LVOT VTI:          0.067 m LVOT/AV VTI ratio: 0.35 MITRAL VALVE MV Area (PHT): 3.42 cm    SHUNTS MV Decel Time: 222 msec    Systemic VTI:  0.07 m MV E velocity: 80.25 cm/s  Systemic Diam: 2.30 cm MV A velocity: 32.00 cm/s MV E/A ratio:  2.51 Franck Azobou Tonleu Electronically signed by Joelle Cedars Tonleu Signature Date/Time: 10/08/2024/9:15:09 AM    Final     EKG: sinus rhythm with RBBB, QRS ~285ms (personally reviewed)  TELEMETRY: sinus rhythm with frequent PVCs. Rate 60s-80s  (personally reviewed)  DEVICE HISTORY: n/a  Assessment/Plan:  Acute on chronic systolic CHF, NICM Chronic Right bundle branch block Patient with severe biventricular heart failure, NICM of unknown cause admitted with acute exacerbation. He was started on Milrinone  with concern of low output state and has had notable clinical improvement. Milrinone  weaned to 0.120mcg/kg/min, CVP 8 this morning. ECGs show wide RBBB with QRS >279ms. Case discussed with HF team today, they feel that patient could benefit from CRT given poor candidacy for LVAD and limited transplant window. To be a candidate for CRT-D, patient Krisanne Lich need to remain hemodynamically stable off Milrinone  overnight with PICC removed. Concern for infection risk if PICC needed following device implant. We Jayzen Paver tentatively plan to move forward with CRT-D implant with Dr. Inocencio tomorrow.  GDMT and Milrinone  titration per HF team.  PVCs Telemetry with frequent PVCs. Could consider increased lower rate limit following CRT-D implant to suppress vs initiating Amiodarone . Replete K today.   CKD Creatinine showing improvement from admission, 2.28->2.11.      For questions or updates, please contact [facility] Please consult [email or web address] for contact info under     Signed, Artist Pouch, PA-C  10/21/2024, 11:38 AM       I have seen and examined this patient with Artist Pouch.  Agree with above, note added to reflect my findings.  Patient with a past medical history as above.  He presented to the hospital this admission with dyspnea, bloating, nausea, cool extremities.  His weight had also been increasing.  He was started on milrinone  in the emergency room.  Right heart catheterization showed preserved cardiac output.  Symptomatically, he improved with diuresis and inotropic support.  He is now feeling much improved.  He is able to lie flat.  He did not have PND orthopnea prior to admission, but does state that he is no longer  short of breath with exertion.  GEN: No acute distress.   Neck: No JVD Cardiac: RRR, no murmurs, rubs, or gallops.  Respiratory: normal BS bases bilaterally. GI: Soft, nontender, non-distended  MS: No edema; No deformity. Neuro:  Nonfocal  Skin: warm and dry Psych: Normal affect    Acute on chronic systolic heart failure: Has biventricular failure.  He has a right bundle branch  block.  His QRS is significantly wide.  He does potentially have some dyssynchrony noted on his echo.  He would benefit from ICD implant.  Due to his right bundle branch block and possible dyssynchrony, we Siya Flurry plan for CRT-D implant likely tomorrow. PVCs: Has frequent PVCs on telemetry.  Waverly Tarquinio potentially initiate amiodarone  after ICD implantation. CKD: Creatinine improving with diuresis.  Plan per primary team.  Joshu Furukawa M. Maudene Stotler MD 10/21/2024 2:45 PM     [1]  Allergies Allergen Reactions   Loratadine     Headache    Lisinopril Cough   Mobic [Meloxicam] Nausea And Vomiting   Penicillins Nausea Only and Rash    Has patient had a PCN reaction causing immediate rash, facial/tongue/throat swelling, SOB or lightheadedness with hypotension: No Has patient had a PCN reaction causing severe rash involving mucus membranes or skin necrosis: No Has patient had a PCN reaction that required hospitalization: No Has patient had a PCN reaction occurring within the last 10 years: No If all of the above answers are NO, then may proceed with Cephalosporin use.   "

## 2024-10-22 ENCOUNTER — Inpatient Hospital Stay (HOSPITAL_COMMUNITY): Admission: EM | Disposition: A | Payer: Self-pay | Source: Home / Self Care | Attending: Cardiology

## 2024-10-22 ENCOUNTER — Ambulatory Visit (HOSPITAL_COMMUNITY)

## 2024-10-22 ENCOUNTER — Encounter (HOSPITAL_COMMUNITY): Payer: Self-pay | Admitting: Cardiology

## 2024-10-22 DIAGNOSIS — I428 Other cardiomyopathies: Secondary | ICD-10-CM

## 2024-10-22 HISTORY — PX: BIV ICD INSERTION CRT-D: EP1195

## 2024-10-22 LAB — BASIC METABOLIC PANEL WITH GFR
Anion gap: 11 (ref 5–15)
BUN: 32 mg/dL — ABNORMAL HIGH (ref 8–23)
CO2: 29 mmol/L (ref 22–32)
Calcium: 8.8 mg/dL — ABNORMAL LOW (ref 8.9–10.3)
Chloride: 100 mmol/L (ref 98–111)
Creatinine, Ser: 2.1 mg/dL — ABNORMAL HIGH (ref 0.61–1.24)
GFR, Estimated: 34 mL/min — ABNORMAL LOW
Glucose, Bld: 82 mg/dL (ref 70–99)
Potassium: 4 mmol/L (ref 3.5–5.1)
Sodium: 140 mmol/L (ref 135–145)

## 2024-10-22 LAB — COOXEMETRY PANEL
Carboxyhemoglobin: 1.9 % — ABNORMAL HIGH (ref 0.5–1.5)
Methemoglobin: 0.8 % (ref 0.0–1.5)
O2 Saturation: 65.9 %
Total hemoglobin: 13.4 g/dL (ref 12.0–16.0)

## 2024-10-22 LAB — MAGNESIUM: Magnesium: 2.2 mg/dL (ref 1.7–2.4)

## 2024-10-22 LAB — CBC
HCT: 37.5 % — ABNORMAL LOW (ref 39.0–52.0)
Hemoglobin: 12.6 g/dL — ABNORMAL LOW (ref 13.0–17.0)
MCH: 31.3 pg (ref 26.0–34.0)
MCHC: 33.6 g/dL (ref 30.0–36.0)
MCV: 93.3 fL (ref 80.0–100.0)
Platelets: 123 K/uL — ABNORMAL LOW (ref 150–400)
RBC: 4.02 MIL/uL — ABNORMAL LOW (ref 4.22–5.81)
RDW: 16.1 % — ABNORMAL HIGH (ref 11.5–15.5)
WBC: 5.1 K/uL (ref 4.0–10.5)
nRBC: 0 % (ref 0.0–0.2)

## 2024-10-22 MED ORDER — SPIRONOLACTONE 25 MG PO TABS
50.0000 mg | ORAL_TABLET | Freq: Every day | ORAL | Status: DC
  Administered 2024-10-22 – 2024-10-23 (×2): 50 mg via ORAL
  Filled 2024-10-22 (×2): qty 2

## 2024-10-22 MED ORDER — LIDOCAINE HCL (PF) 1 % IJ SOLN
INTRAMUSCULAR | Status: DC | PRN
  Administered 2024-10-22: 60 mL

## 2024-10-22 MED ORDER — POTASSIUM CHLORIDE CRYS ER 20 MEQ PO TBCR: 40.0000 meq | EXTENDED_RELEASE_TABLET | ORAL | Status: DC

## 2024-10-22 MED ORDER — MELATONIN 3 MG PO TABS
3.0000 mg | ORAL_TABLET | Freq: Every evening | ORAL | Status: DC | PRN
  Administered 2024-10-22 – 2024-10-25 (×4): 3 mg via ORAL
  Filled 2024-10-22 (×4): qty 1

## 2024-10-22 MED ORDER — CEFAZOLIN SODIUM-DEXTROSE 2-4 GM/100ML-% IV SOLN
INTRAVENOUS | Status: AC
  Administered 2024-10-22: 2 g via INTRAVENOUS
  Filled 2024-10-22: qty 100

## 2024-10-22 MED ORDER — FENTANYL CITRATE (PF) 100 MCG/2ML IJ SOLN
INTRAMUSCULAR | Status: AC
  Filled 2024-10-22: qty 2

## 2024-10-22 MED ORDER — IODIXANOL 320 MG/ML IV SOLN
INTRAVENOUS | Status: DC | PRN
  Administered 2024-10-22: 10 mL

## 2024-10-22 MED ORDER — POTASSIUM CHLORIDE CRYS ER 20 MEQ PO TBCR
40.0000 meq | EXTENDED_RELEASE_TABLET | ORAL | Status: AC
  Administered 2024-10-22 (×2): 40 meq via ORAL
  Filled 2024-10-22 (×2): qty 2

## 2024-10-22 MED ORDER — SODIUM CHLORIDE 0.9 % IV SOLN
INTRAVENOUS | Status: DC | PRN
  Administered 2024-10-22: 80 mg

## 2024-10-22 MED ORDER — SODIUM CHLORIDE 0.9 % IV SOLN
INTRAVENOUS | Status: AC
  Filled 2024-10-22: qty 2

## 2024-10-22 MED ORDER — HEPARIN (PORCINE) IN NACL 1000-0.9 UT/500ML-% IV SOLN
INTRAVENOUS | Status: DC | PRN
  Administered 2024-10-22: 500 mL

## 2024-10-22 MED ORDER — MIDAZOLAM HCL 2 MG/2ML IJ SOLN
INTRAMUSCULAR | Status: AC
  Filled 2024-10-22: qty 2

## 2024-10-22 MED ORDER — MIDAZOLAM HCL 5 MG/5ML IJ SOLN
INTRAMUSCULAR | Status: DC | PRN
  Administered 2024-10-22: 1 mg via INTRAVENOUS

## 2024-10-22 MED ORDER — FUROSEMIDE 10 MG/ML IJ SOLN
160.0000 mg | Freq: Once | INTRAVENOUS | Status: DC
  Filled 2024-10-22: qty 16

## 2024-10-22 MED ORDER — FENTANYL CITRATE (PF) 100 MCG/2ML IJ SOLN
INTRAMUSCULAR | Status: DC | PRN
  Administered 2024-10-22: 25 ug via INTRAVENOUS

## 2024-10-22 MED ORDER — FUROSEMIDE 10 MG/ML IJ SOLN
120.0000 mg | Freq: Once | INTRAVENOUS | Status: AC
  Administered 2024-10-22: 120 mg via INTRAVENOUS
  Filled 2024-10-22: qty 120

## 2024-10-22 MED ORDER — LIDOCAINE HCL (PF) 1 % IJ SOLN
INTRAMUSCULAR | Status: AC
  Filled 2024-10-22: qty 60

## 2024-10-22 NOTE — Progress Notes (Addendum)
 Patient ID: Jonathan Fowler, male   DOB: 1955-10-21, 69 y.o.   MRN: [id number]     Advanced Heart Failure Rounding Note  HF Cardiologist: Dr. Zenaida  Chief Complaint: CHF  Subjective:   RHC 10/20/24 on 0.25 milrinone : RA mean 13, PA 54/21 (34), PCWP mean 27, CO/CI (TD) 5.1/2.3  Milrinone  weaned off yesterday. Co-ox 66%.  CVP 14.   Continue to feel well. No shortness of breath.    Objective:   Weight Range: 98.8 kg Body mass index is 30.38 kg/m.   Vital Signs:   Temp:  [97.6 F (36.4 C)-99.1 F (37.3 C)] 97.6 F (36.4 C) (01/09 0300) Pulse Rate:  [62-75] 62 (01/09 0300) Resp:  [18-20] 20 (01/09 0300) BP: (94-119)/(56-80) 110/69 (01/09 0300) SpO2:  [93 %-100 %] 100 % (01/09 0300) Weight:  [98.8 kg-100.1 kg] 98.8 kg (01/09 0300) Last BM Date : 10/21/24  Weight change: Filed Weights   10/21/24 0418 10/21/24 0809 10/22/24 0300  Weight: 100.2 kg 100.1 kg 98.8 kg    Intake/Output:   Intake/Output Summary (Last 24 hours) at 10/22/2024 0659 Last data filed at 10/22/2024 0300 Gross per 24 hour  Intake 768 ml  Output 4050 ml  Net -3282 ml     Physical Exam   General: Lying comfortably in bed Neck: JVP 12-14 Cor: Regular rate & rhythm. No murmurs. Lungs: breathing nonlabored Extremities: 1-2+ edema  Telemetry   SR occasional PVCs  Labs   CBC Recent Labs    10/21/24 0505 10/22/24 0447  WBC 5.1 5.1  HGB 11.9* 12.6*  HCT 35.3* 37.5*  MCV 91.7 93.3  PLT 119* 123*   Basic Metabolic Panel Recent Labs    98/91/73 0505 10/21/24 1421 10/22/24 0447  NA 139 136 140  K 3.3* 3.6 4.0  CL 98 95* 100  CO2 32 34* 29  GLUCOSE 112* 175* 82  BUN 32* 32* 32*  CREATININE 2.11* 2.33* 2.10*  CALCIUM  8.7* 8.8* 8.8*  MG 1.9  --  2.2   Liver Function Tests No results for input(s): AST, ALT, ALKPHOS, BILITOT, PROT, ALBUMIN in the last 72 hours.  No results for input(s): LIPASE, AMYLASE in the last 72 hours.  Cardiac Enzymes No results for  input(s): CKTOTAL, CKMB, CKMBINDEX, TROPONINI in the last 72 hours.  BNP: BNP (last 3 results) Recent Labs    06/29/24 1412 08/30/24 1923 09/27/24 0953  BNP 1,746.1* >4,500.0* >4,500.0*    ProBNP (last 3 results) Recent Labs    10/18/24 1439  PROBNP 19,343.0*     D-Dimer No results for input(s): DDIMER in the last 72 hours. Hemoglobin A1C No results for input(s): HGBA1C in the last 72 hours. Fasting Lipid Panel No results for input(s): CHOL, HDL, LDLCALC, TRIG, CHOLHDL, LDLDIRECT in the last 72 hours. Medications:   Scheduled Medications:  amiodarone   200 mg Oral BID   aspirin  EC  81 mg Oral Daily   atorvastatin   10 mg Oral Daily   Chlorhexidine  Gluconate Cloth  6 each Topical Daily   gentamicin  (GARAMYCIN ) 80 mg in sodium chloride  0.9 % 500 mL irrigation  80 mg Irrigation On Call   heparin   5,000 Units Subcutaneous Q8H   sacubitril -valsartan   1 tablet Oral BID   sodium chloride  flush  10-40 mL Intracatheter Q12H   sodium chloride  flush  3 mL Intravenous Q12H   sodium chloride  flush  3 mL Intravenous Q12H   spironolactone   25 mg Oral Daily    Infusions:  sodium chloride  50 mL/hr at 10/22/24  9380    ceFAZolin  (ANCEF ) IV      PRN Medications: acetaminophen , alum & mag hydroxide-simeth, melatonin, ondansetron  (ZOFRAN ) IV, sodium chloride  flush, sodium chloride  flush, sodium chloride  flush  Assessment/Plan   1. Acute on chronic systolic CHF: Biventricular failure, suspect nearing end stage.  NICM.  Cath 9/25 with nonobstructive CAD and preserved CI.  cMRI 9/25 with LV EF 16%, RV EF 19%, basal septal LGE (nonspecific, seen with dilated CMP).  Echo in 12/25 with EF 18%, severe LV dilation, severe RV dysfunction, severe biatrial enlargement, IVC dilated. CPX with moderate-severe functional impairment due to HF. NYHA class IV on admission with lactic acid of 2.    RHC 10/20/24 on 0.25 milrinone : RA mean 13, PA 54/21 (34), PCWP mean 27, CO/CI (TD)  5.1/2.3.  - Has wide RBBB (QRS > 200 ms), seen by EP. Plan for CRT-D today - Co-ox 66% off milrinone . Remove PICC pre device implant. - CVP 14. Give 120 mg lasix  IV this am - Continue to hold beta blocker - Continue Entresto  97/103 mg BID - Increase spiro to 50 mg daily - Holding jardiance  for procedure today, resume tomorrow - Headache with imdur /hydralazine .  - Worry he may have end stage biventricular cardiomyopathy.  Concerned he won't be a very good LVAD candidate with significant RV failure and elevated creatinine.  Heart/kidney transplant would be a consideration but he will have a narrow window for this. Will see how he does post CRT-D.  2. CKD stage 3b: Creatinine stable at 2.1, this seems to be around his baseline for at least the last month.   - Suspect cardiorenal syndrome.   3. DM2: SSI.   4. HTN: BP improved with titration of GDMT  5. Hypokalemia: K 4.0 - Supp aggressively with diuresis - Increase spiro as above  6. Iron  deficiency anemia - T sat 17% - Given venofer   7. PVCs - May be 2/2 inotrope support - Started po amiodarone  200 BID - Burden improved, may be able to stop at discharge now that he is off milrinone  - Supp K and mag    Length of Stay: 4  Malakye Nolden N, PA-C  10/22/2024, 6:59 AM  Advanced Heart Failure Team Pager [phone] (M-F; 7a - 5p)   Please visit [email or web address]: For overnight coverage please call cardiology fellow first. If fellow not available call Shock/ECMO MD on call.  For ECMO / Mechanical Support (Impella, IABP, LVAD) issues call Shock / ECMO MD on call.

## 2024-10-22 NOTE — Discharge Summary (Incomplete)
 Advanced Heart Failure Team  Discharge Summary   Patient ID: Jonathan Fowler MRN: [id number], DOB/AGE: [birth year] 69 y.o. Admit date: 10/18/2024 D/C date:     10/22/2024   Primary Discharge Diagnoses:  Acute on chronic biventricular systolic heart failure NICM CKD IIIb  Secondary Discharge Diagnoses:  DM II HTN Hypokalemia Iron  deficiency anemia PVCs  Hospital Course:  Jonathan Fowler is a 69 y.o. male with history of acute on chronic biventricular heart failure, HTN and CKD III.   He was diagnosed with heart failure during admission in 09/25. Echo showed EF <20%, AHF consulted.  RHC 07/02/2024 with severely elevated biventricular filling pressures and reduced cardiac index. cMRI with NiCM BiV HF, LVEF 15%. As renal function improved, underwent [facility] 07/05/24 with nonobs CAD, elevated filling pressures and preserved CO. Required further diuresis and GDMT titrated. He was discharged home, weight 213 lbs.    Repeat echo 12/25 again showed EF <20%, biplan 18.4%, G3DD, severely reduced RV function, BAE   He was seen in AHF clinic 10/19/23 with volume overload and NYHA IV symptoms. He was sent to the ED and admitted for inotrope assisted diuresis. RHC on 0.25 milrinone  with elevated filling pressures and preserved cardiac output. Once diuresed and GDMT optimized milrinone  was weaned. Seen by EP given very wide QRS and underwent CRT-D implant. ***  Discharge Weight: *** Discharge Vitals: Blood pressure 126/86, pulse 77, temperature 98 F (36.7 C), temperature source Oral, resp. rate 18, height 5' 11 (1.803 m), weight 98.8 kg, SpO2 98%.  Labs: Lab Results  Component Value Date   WBC 5.1 10/22/2024   HGB 12.6 (L) 10/22/2024   HCT 37.5 (L) 10/22/2024   MCV 93.3 10/22/2024   PLT 123 (L) 10/22/2024    Recent Labs  Lab 10/18/24 1439 10/18/24 1700 10/22/24 0447  NA 144   < > 140  K 3.1*   < > 4.0  CL 99   < > 100  CO2 30   < > 29  BUN 38*   < > 32*  CREATININE 2.17*   < > 2.10*   CALCIUM  9.5   < > 8.8*  PROT 6.4*  --   --   BILITOT 1.8*  --   --   ALKPHOS 88  --   --   ALT 26  --   --   AST 34  --   --   GLUCOSE 95   < > 82   < > = values in this interval not displayed.   No results found for: CHOL, HDL, LDLCALC, TRIG BNP (last 3 results) Recent Labs    06/29/24 1412 08/30/24 1923 09/27/24 0953  BNP 1,746.1* >4,500.0* >4,500.0*    ProBNP (last 3 results) Recent Labs    10/18/24 1439  PROBNP 19,343.0*     Diagnostic Studies/Procedures   RHC 10/20/24: HEMODYNAMICS: RA:       13 mmHg (mean) RV:       54/2, 13 mmHg PA:       54/24 mmHg (34 mean) PCWP: 24 mmHg (mean) with v waves to 34                                      Estimated Fick CO/CI   6.08L/min, 2.76L/min/m2       Thermodilution CO/CI   5.14L/min, 2.34L/min/m2  TPG  10  mmHg                                               PVR  <2 Wood Units  PAPi  2.31   SVR 1431 dynes/sec*cm-5     IMPRESSION: Right heart catheterization for consideration of advanced therapies on 0.25 of milrinone  Moderately elevated left and right heart filling pressures Preserved cardiac output by TD on inotropic support Normal PVR   RECOMMENDATIONS: Continue IV diuresis, can hopefully wean milrinone  with plan towards CRT-D  Discharge Medications   Allergies as of 10/22/2024       Reactions   Loratadine    Headache   Lisinopril Cough   Mobic [meloxicam] Nausea And Vomiting   Penicillins Nausea Only, Rash   Has patient had a PCN reaction causing immediate rash, facial/tongue/throat swelling, SOB or lightheadedness with hypotension: No Has patient had a PCN reaction causing severe rash involving mucus membranes or skin necrosis: No Has patient had a PCN reaction that required hospitalization: No Has patient had a PCN reaction occurring within the last 10 years: No If all of the above answers are NO, then may proceed with Cephalosporin use.     Med Rec must be  completed prior to using this [facility]***        Durable Medical Equipment  (From admission, onward)           Start     Ordered   10/20/24 1351  Heart failure home health orders  (Heart failure home health orders / Face to face)  Once       Comments: Heart Failure Follow-up Care:  Verify follow-up appointments per Patient Discharge Instructions. Confirm transportation arranged. Reconcile home medications with discharge medication list. Remove discontinued medications from use. Assist patient/caregiver to manage medications using pill box. Reinforce low sodium food selection Assessments: Vital signs and oxygen saturation at each visit. Assess home environment for safety concerns, caregiver support and availability of low-sodium foods. Consult Child Psychotherapist, PT/OT, Dietitian, and CNA based on assessments. Perform comprehensive cardiopulmonary assessment. Notify MD for any change in condition or weight gain of 3 pounds in one day or 5 pounds in one week with symptoms. Daily Weights and Symptom Monitoring: Ensure patient has access to scales. Teach patient/caregiver to weigh daily before breakfast and after voiding using same scale and record.    Teach patient/caregiver to track weight and symptoms and when to notify Provider. Activity: Develop individualized activity plan with patient/caregiver.  [facility] to provide  Labs every other week to include BMET, Mg, and CBC with Diff. Additional as needed. Should be drawn via PERIPHERAL stick. NOT PICC line.   [id number] Milrinone  0.25 mcg/kg/min X 52 weeks A4221 Supplies for maintenance of drug infusion catheter A4222 Supplies for the external drug infusion per cassette or bag E0781 Ambulatory Infusion pump  Question Answer Comment  Heart Failure Follow-up Care Advanced Heart Failure (AHF) Clinic at [phone]   Home Health Visits Set up telemonitoring equipment to monitor daily vital signs, weights and oxygen saturation   Obtain the  following labs Basic Metabolic Panel   Lab frequency Other see comments   Fax lab results to AHF Clinic at [phone]   Diet Low Sodium Heart Healthy   Fluid restrictions: See other comments   Skilled Nurse to notify MD of weight trends weekly for first 2 weeks. May  fax or call: AHF Clinic at [phone] (fax) or [phone]      10/20/24 1353            Disposition   The patient will be discharged in stable condition to home.      Duration of Discharge Encounter: Greater than 35 minutes   Signed, Jaydien Panepinto N  10/22/2024, 2:08 PM

## 2024-10-22 NOTE — Plan of Care (Signed)

## 2024-10-22 NOTE — TOC Progression Note (Signed)
 Transition of Care [facility]) - Progression Note    Patient Details  Name: Jonathan Fowler MRN: [id number] Date of Birth: 1956/03/30  Transition of Care [facility]) CM/SW Contact  Andrez JULIANNA George, RN Phone Number: 10/22/2024, 3:01 PM  Clinical Narrative:     Pt is off the IV milrinone . Plan is for AICD today.  PCP appt on AVS.  IP Care management following.  Expected Discharge Plan: Home w Home Health Services Barriers to Discharge: Continued Medical Work up               Expected Discharge Plan and Services       Living arrangements for the past 2 months: Single Family Home                                       Social Drivers of Health (SDOH) Interventions SDOH Screenings   Food Insecurity: No Food Insecurity (10/19/2024)  Housing: Low Risk (10/19/2024)  Transportation Needs: No Transportation Needs (10/19/2024)  Utilities: Not At Risk (10/19/2024)  Depression (PHQ2-9): Low Risk (08/02/2024)  Social Connections: Moderately Isolated (10/19/2024)  Tobacco Use: Low Risk (10/18/2024)    Readmission Risk Interventions    07/07/2024   11:26 AM  Readmission Risk Prevention Plan  Post Dischage Appt Complete  Medication Screening Complete  Transportation Screening Complete

## 2024-10-22 NOTE — Discharge Instructions (Signed)
 After Your ICD (Implantable Cardiac Defibrillator)   You have a Medtronic ICD  If you have a Medtronic or Biotronik device, plug in your home monitor once you get home, and no manual interaction is required.   If you have an Abbott or [employer]one device, plug your home monitor once you get home, sit near the device, and press the large activation button. Sit nearby until the process is complete, usually notated by lights on the monitor.   If you were set up for monitoring using an app on your phone, make sure the app remains open in the background and the Bluetooth remains on.  ACTIVITY Do not lift your arm above shoulder height for 1 week after your procedure. After 7 days, you may progress as below.  You should remove your sling 24 hours after your procedure, unless otherwise instructed by your provider.     Friday October 29, 2024  Saturday October 30, 2024 Sunday October 31, 2024 Monday November 01, 2024   Do not lift, push, pull, or carry anything over 10 pounds with the affected arm until 6 weeks (Friday December 03, 2024 ) after your procedure.   You may drive AFTER your wound check, UNLESS you have been told otherwise by your provider.   Ask your healthcare provider when you can go back to work   INCISION/Dressing If you are on a blood thinner such as Coumadin, Xarelto, Eliquis , Plavix , or Pradaxa please confirm with your provider when this should be resumed.   If large square, outer bandage is left in place, this can be removed after 24 hours from your procedure. Do not remove steri-strips or glue as below.   Monitor your defibrillator site for redness, swelling, and drainage. Call the device clinic at [phone] if you experience these symptoms or fever/chills.    If your incision is sealed with Steri-strips or staples, you may shower 7 days after your procedure or when told by your provider. Do not remove the steri-strips or let the shower hit directly on your site.  You may wash around your site with soap and water.    If you were discharged in a sling, please do not wear this during the day more than 48 hours after your surgery unless otherwise instructed. This may increase the risk of stiffness and soreness in your shoulder.   Avoid lotions, ointments, or perfumes over your incision until it is well-healed.  You may use a hot tub or a pool AFTER your wound check appointment if the incision is completely closed.  Your ICD is designed to protect you from life threatening heart rhythms. Because of this, you may receive a shock.   1 shock with no symptoms:  Call the office during business hours. 1 shock with symptoms (chest pain, chest pressure, dizziness, lightheadedness, shortness of breath, overall feeling unwell):  Call 911. If you experience 2 or more shocks in 24 hours:  Call 911. If you receive a shock, you should not drive for 6 months per the [place] DMV IF you receive appropriate therapy from your ICD.   ICD Alerts:  Some alerts are vibratory and others beep. These are NOT emergencies. Please call our office to let us  know. If this occurs at night or on weekends, it can wait until the next business day. Send a remote transmission.  If your device is capable of reading fluid status (for heart failure), you will be offered monthly monitoring to review this with you.   DEVICE MANAGEMENT  Remote monitoring is used to monitor your ICD from home. This monitoring is scheduled every 91 days by our office. It allows us  to keep an eye on the functioning of your device to ensure it is working properly. You will routinely see your Electrophysiologist annually (more often if necessary). This will appear as a REMOTE check on your MyChart schedule. These are automatic and there is nothing for you to manually do unless otherwise instructed.  You should receive your ID card for your new device in 4-8 weeks. Keep this card with you at all times once received. Consider  wearing a medical alert bracelet or necklace.  Your ICD  may be MRI compatible. This will be discussed at your next office visit/wound check.  You should avoid contact with strong electric or magnetic fields.   Do not use amateur (ham) radio equipment or electric (arc) welding torches. MP3 player headphones with magnets should not be used. Some devices are safe to use if held at least 12 inches (30 cm) from your defibrillator. These include power tools, lawn mowers, and speakers. If you are unsure if something is safe to use, ask your health care provider.  When using your cell phone, hold it to the ear that is on the opposite side from the defibrillator. Do not leave your cell phone in a pocket over the defibrillator.  You may safely use electric blankets, heating pads, computers, and microwave ovens.  Call the office right away if: You have chest pain. You feel more than one shock. You feel more short of breath than you have felt before. You feel more light-headed than you have felt before. Your incision starts to open up.  This information is not intended to replace advice given to you by your health care provider. Make sure you discuss any questions you have with your health care provider.

## 2024-10-22 NOTE — Progress Notes (Signed)
 PICC removed per protocol per MD order. Manual pressure applied for 5 mins. Vaseline gauze, gauze, and Tegaderm applied over insertion site. No bleeding or swelling noted. Instructed patient to remain in bed for thirty mins. Educated patient about S/S of infection and when to call MD; no heavy lifting or pressure on right side for 24 hours; keep dressing dry and intact for 24 hours. Pt verbalized comprehension.

## 2024-10-22 NOTE — Plan of Care (Signed)
   Problem: Education: Goal: Knowledge of General Education information will improve Description Including pain rating scale, medication(s)/side effects and non-pharmacologic comfort measures Outcome: Progressing   Problem: Health Behavior/Discharge Planning: Goal: Ability to manage health-related needs will improve Outcome: Progressing

## 2024-10-22 NOTE — Progress Notes (Addendum)
 "  Patient Name: Jonathan Fowler Date of Encounter: 10/22/2024  Primary Cardiologist: None Electrophysiologist: None  Interval Summary   The patient is doing well today.  At this time, the patient denies chest pain, shortness of breath, or any new concerns.  Vital Signs    Vitals:   10/21/24 1900 10/21/24 2300 10/22/24 0300 10/22/24 0755  BP: 109/74 119/80 110/69 126/86  Pulse: 70 75 62 77  Resp: 20 20 20 18   Temp: 99.1 F (37.3 C) 98.3 F (36.8 C) 97.6 F (36.4 C) 98 F (36.7 C)  TempSrc: Oral Oral Oral Oral  SpO2: 98% 94% 100% 98%  Weight:   98.8 kg   Height:   5' 11 (1.803 m)     Intake/Output Summary (Last 24 hours) at 10/22/2024 0942 Last data filed at 10/22/2024 0700 Gross per 24 hour  Intake 711 ml  Output 3800 ml  Net -3089 ml   Filed Weights   10/21/24 0418 10/21/24 0809 10/22/24 0300  Weight: 100.2 kg 100.1 kg 98.8 kg    Physical Exam    GEN- NAD, Alert and oriented  Lungs- Clear to ausculation bilaterally, normal work of breathing Cardiac- Regular rate and rhythm, no murmurs, rubs or gallops GI- soft, NT, ND, + BS Extremities- no clubbing or cyanosis. No edema  Telemetry    Sinus rhythm with intermittent PVCs. Overall burden low. (personally reviewed)  Hospital Course    Jonathan Fowler is a 69 y.o. male with a history of acute on chronic biventricular heart failure, HTN and CKD III. Patient seen in HF clinic on 10/19/23 with NYHA IV symptoms, dyspnea, bloating, nausea, cool extremities. With symptoms and increased weight, patient sent to the ED for evaluation of low output HF. Patient found with Co-ox 51% with lactate 2.0, started on Milrinone . RHC on 01/07 with preserved CO on 0.25 Milrinone . Symptomatically improved with diuresis and inotrope support along with afterload reduction. This morning patient continuing to show improvement, Milrinone  now being titrated down. EP asked to see patient regarding possible CRT-D implant.   Assessment & Plan     Acute on chronic systolic CHF, NICM Chronic Right bundle branch block Patient with severe biventricular heart failure, NICM of unknown cause admitted with acute exacerbation. He was started on Milrinone  with concern of low output state and has had notable clinical improvement. Case discussed with HF team, they feel that patient could benefit from CRT given poor candidacy for LVAD and limited transplant window. ECGs show wide RBBB with QRS >260ms. Patient now off Milrinone  and PICC line has been discontinued.  Plan for CRT-D today with Dr. Inocencio.  CVP increased today 8->14, diuretics per AHF team.   PVCs Telemetry with improved PVC burden. If increased, could consider increased lower rate limit following CRT-D implant to suppress vs initiating Amiodarone .    CKD Creatinine showing improvement from admission, 2.28->2.11->2.10.     For questions or updates, please contact [facility] Please consult [email or web address] for contact info under     Signed, Artist Pouch, PA-C  10/22/2024, 9:42 AM    I have seen and examined this patient with Artist Pouch.  Agree with above, note added to reflect my findings.  Feeling well.  No acute complaints.  GEN: No acute distress.   Neck: No JVD Cardiac: RRR Respiratory: normal work of breathing GI: Soft, nontender, non-distended  MS: No edema Neuro:  Nonfocal  Skin: warm and dry Psych: Normal affect    Acute on chronic systolic heart failure: Due  to nonischemic cardiomyopathy.  No evidence of volume overload, though his CVP did increase.  Getting diuretics per heart failure team.  Jonathan Fowler plan for CRT D implant today.  Risks and benefits were discussed.  The patient understands the risks and has agreed to the procedure. PVCs: PVC burden improved on telemetry.  Jonathan Fowler monitor with CRT D.  May need to initiate amiodarone . CKD: Slight improvement since admission.  Plan per primary team.  Explained risks, benefits, and alternatives to ICD  implantation, including but not limited to bleeding, infection, pneumothorax, pericardial effusion, lead dislodgement, heart attack, stroke, or death.  Pt verbalized understanding and agrees to proceed.   Jonathan Fowler M. Jonathan Buechner MD 10/22/2024 11:28 AM  "

## 2024-10-23 ENCOUNTER — Inpatient Hospital Stay (HOSPITAL_COMMUNITY)

## 2024-10-23 DIAGNOSIS — Z4502 Encounter for adjustment and management of automatic implantable cardiac defibrillator: Secondary | ICD-10-CM

## 2024-10-23 LAB — CBC
HCT: 39.6 % (ref 39.0–52.0)
Hemoglobin: 13.1 g/dL (ref 13.0–17.0)
MCH: 31 pg (ref 26.0–34.0)
MCHC: 33.1 g/dL (ref 30.0–36.0)
MCV: 93.6 fL (ref 80.0–100.0)
Platelets: 145 K/uL — ABNORMAL LOW (ref 150–400)
RBC: 4.23 MIL/uL (ref 4.22–5.81)
RDW: 16.3 % — ABNORMAL HIGH (ref 11.5–15.5)
WBC: 6.6 K/uL (ref 4.0–10.5)
nRBC: 0 % (ref 0.0–0.2)

## 2024-10-23 LAB — BASIC METABOLIC PANEL WITH GFR
Anion gap: 12 (ref 5–15)
BUN: 32 mg/dL — ABNORMAL HIGH (ref 8–23)
CO2: 27 mmol/L (ref 22–32)
Calcium: 9.3 mg/dL (ref 8.9–10.3)
Chloride: 98 mmol/L (ref 98–111)
Creatinine, Ser: 2.47 mg/dL — ABNORMAL HIGH (ref 0.61–1.24)
GFR, Estimated: 28 mL/min — ABNORMAL LOW
Glucose, Bld: 92 mg/dL (ref 70–99)
Potassium: 4.3 mmol/L (ref 3.5–5.1)
Sodium: 137 mmol/L (ref 135–145)

## 2024-10-23 LAB — MAGNESIUM: Magnesium: 2.3 mg/dL (ref 1.7–2.4)

## 2024-10-23 MED ORDER — EMPAGLIFLOZIN 10 MG PO TABS
10.0000 mg | ORAL_TABLET | Freq: Every day | ORAL | Status: DC
  Administered 2024-10-23 – 2024-10-26 (×4): 10 mg via ORAL
  Filled 2024-10-23 (×5): qty 1

## 2024-10-23 MED ORDER — OXYCODONE HCL 5 MG PO TABS
5.0000 mg | ORAL_TABLET | Freq: Two times a day (BID) | ORAL | Status: AC | PRN
  Administered 2024-10-23: 5 mg via ORAL
  Filled 2024-10-23: qty 1

## 2024-10-23 MED ORDER — OXYCODONE HCL 5 MG PO TABS: 5.0000 mg | ORAL_TABLET | Freq: Two times a day (BID) | ORAL | Status: DC | PRN

## 2024-10-23 MED ORDER — GUAIFENESIN-DM 100-10 MG/5ML PO SYRP
5.0000 mL | ORAL_SOLUTION | ORAL | Status: DC | PRN
  Administered 2024-10-23 (×2): 5 mL via ORAL
  Filled 2024-10-23 (×2): qty 5

## 2024-10-23 MED ORDER — OXYCODONE HCL 5 MG PO TABS
5.0000 mg | ORAL_TABLET | Freq: Once | ORAL | Status: AC
  Administered 2024-10-23: 5 mg via ORAL
  Filled 2024-10-23: qty 1

## 2024-10-23 MED ORDER — TORSEMIDE 20 MG PO TABS
40.0000 mg | ORAL_TABLET | Freq: Two times a day (BID) | ORAL | Status: DC
  Administered 2024-10-23 (×2): 40 mg via ORAL
  Filled 2024-10-23 (×2): qty 2

## 2024-10-23 NOTE — Progress Notes (Signed)
 Patient ID: Jonathan Fowler, male   DOB: 01-10-1956, 69 y.o.   MRN: [id number]     Advanced Heart Failure Rounding Note  HF Cardiologist: Dr. Zenaida  Chief Complaint: CHF  Subjective:   RHC 10/20/24 on 0.25 milrinone : RA mean 13, PA 54/21 (34), PCWP mean 27, CO/CI (TD) 5.1/2.3  Patient got Lasix  120 mg IV x 1 yesterday, I/Os net negaive 1930, weight down 3 lbs.  PICC is out, he is off milrinone .   MDT CRT-D device placed yesterday.   He does not feels as good today, has sore throat and cough.  Creatinine 2.1 => 2.47.   He had 1 NSVT run over the last day, remains on amiodarone  200 bid.     Objective:   Weight Range: 97.3 kg Body mass index is 29.92 kg/m.   Vital Signs:   Temp:  [97.7 F (36.5 C)-98.7 F (37.1 C)] 98.7 F (37.1 C) (01/10 0707) Pulse Rate:  [63-81] 81 (01/10 0428) Resp:  [12-31] 19 (01/10 0032) BP: (111-156)/(76-120) 146/105 (01/10 0707) SpO2:  [87 %-98 %] 91 % (01/10 0428) Weight:  [97.3 kg] 97.3 kg (01/10 0458) Last BM Date : 10/21/24  Weight change: Filed Weights   10/21/24 0809 10/22/24 0300 10/23/24 0458  Weight: 100.1 kg 98.8 kg 97.3 kg    Intake/Output:   Intake/Output Summary (Last 24 hours) at 10/23/2024 1053 Last data filed at 10/23/2024 0920 Gross per 24 hour  Intake 360 ml  Output 650 ml  Net -290 ml     Physical Exam   General: NAD Neck: JVP 8-9 cm, no thyromegaly or thyroid  nodule.  Lungs: Clear to auscultation bilaterally with normal respiratory effort. CV: Nondisplaced PMI.  Heart regular S1/S2, no S3/S4, no murmur.  1+ ankle edema.  Abdomen: Soft, nontender, no hepatosplenomegaly, no distention.  Skin: Intact without lesions or rashes.  Neurologic: Alert and oriented x 3.  Psych: Normal affect. Extremities: No clubbing or cyanosis.  HEENT: Normal.   Telemetry   NSR with a run of NSVT (personally reviewed)  Labs   CBC Recent Labs    10/22/24 0447 10/23/24 0236  WBC 5.1 6.6  HGB 12.6* 13.1  HCT 37.5* 39.6   MCV 93.3 93.6  PLT 123* 145*   Basic Metabolic Panel Recent Labs    98/90/73 0447 10/23/24 0236  NA 140 137  K 4.0 4.3  CL 100 98  CO2 29 27  GLUCOSE 82 92  BUN 32* 32*  CREATININE 2.10* 2.47*  CALCIUM  8.8* 9.3  MG 2.2 2.3   Liver Function Tests No results for input(s): AST, ALT, ALKPHOS, BILITOT, PROT, ALBUMIN in the last 72 hours.  No results for input(s): LIPASE, AMYLASE in the last 72 hours.  Cardiac Enzymes No results for input(s): CKTOTAL, CKMB, CKMBINDEX, TROPONINI in the last 72 hours.  BNP: BNP (last 3 results) Recent Labs    06/29/24 1412 08/30/24 1923 09/27/24 0953  BNP 1,746.1* >4,500.0* >4,500.0*    ProBNP (last 3 results) Recent Labs    10/18/24 1439  PROBNP 19,343.0*     D-Dimer No results for input(s): DDIMER in the last 72 hours. Hemoglobin A1C No results for input(s): HGBA1C in the last 72 hours. Fasting Lipid Panel No results for input(s): CHOL, HDL, LDLCALC, TRIG, CHOLHDL, LDLDIRECT in the last 72 hours. Medications:   Scheduled Medications:  amiodarone   200 mg Oral BID   aspirin  EC  81 mg Oral Daily   atorvastatin   10 mg Oral Daily   Chlorhexidine  Gluconate Cloth  6 each Topical Daily   empagliflozin   10 mg Oral Daily   heparin   5,000 Units Subcutaneous Q8H   sacubitril -valsartan   1 tablet Oral BID   sodium chloride  flush  10-40 mL Intracatheter Q12H   sodium chloride  flush  3 mL Intravenous Q12H   sodium chloride  flush  3 mL Intravenous Q12H   spironolactone   50 mg Oral Daily   torsemide   40 mg Oral BID    Infusions:    PRN Medications: acetaminophen , alum & mag hydroxide-simeth, guaiFENesin -dextromethorphan , melatonin, ondansetron  (ZOFRAN ) IV, oxyCODONE , sodium chloride  flush, sodium chloride  flush  Assessment/Plan   1. Acute on chronic systolic CHF: Biventricular failure, suspect nearing end stage.  NICM.  Cath 9/25 with nonobstructive CAD and preserved CI.  cMRI 9/25 with LV  EF 16%, RV EF 19%, basal septal LGE (nonspecific, seen with dilated CMP).  Echo in 12/25 with EF 18%, severe LV dilation, severe RV dysfunction, severe biatrial enlargement, IVC dilated. CPX with moderate-severe functional impairment due to HF. NYHA class IV on admission with lactic acid of 2.  RHC 10/20/24 on 0.25 milrinone : RA mean 13, PA 54/21 (34), PCWP mean 27, CO/CI (TD) 5.1/2.3.  - Has wide RBBB (QRS > 200 ms), seen by EP => MDT CRT-D device placed.  - Milrinone  off and PICC out.  - JVP 8-9 today, weight down with diuresis yesterday.  Creatinine up to 2.47.  With rise in creatinine, will not give IV Lasix  today.  Will start torsemide  40 mg bid.  - Continue to hold beta blocker with low output.  - Continue Entresto  97/103 mg BID - Continue spironolactone  50 mg daily - Restart Jardiance  10 mg daily.  - Headache with imdur /hydralazine , was stopped.  - He is now on po meds but does not feel as good today.  Will watch in hospital today with addition of torsemide .  If volume status and renal function stable tomorrow, may be able to go home.  - Worry he may have end stage biventricular cardiomyopathy.  Concerned he won't be a very good LVAD candidate with significant RV failure and elevated creatinine.  Heart/kidney transplant would be a consideration but he will have a narrow window for this. Will see how he does post CRT-D.  2. CKD stage 3b: Creatinine higher at 2.47 today.  Follow closely.    - Suspect cardiorenal syndrome.   3. DM2: SSI.   4. HTN: BP improved with titration of GDMT  5. Iron  deficiency anemia - T sat 17% - Given venofer   6. PVCs/NSVT - May be 2/2 inotrope support - Started po amiodarone  200 BID - Had a run of NSVT off milrinone  last night.  - Will either decrease or stop amiodarone  at discharge, follow telemetry.   Mobilize.   Length of Stay: 5  Ezra Shuck, MD  10/23/2024, 10:53 AM  Advanced Heart Failure Team Pager [phone] (M-F; 7a - 5p)   Please visit  [email or web address]: For overnight coverage please call cardiology fellow first. If fellow not available call Shock/ECMO MD on call.  For ECMO / Mechanical Support (Impella, IABP, LVAD) issues call Shock / ECMO MD on call.

## 2024-10-23 NOTE — Plan of Care (Signed)
   Problem: Clinical Measurements: Goal: Ability to maintain clinical measurements within normal limits will improve Outcome: Progressing

## 2024-10-23 NOTE — Progress Notes (Signed)
"  °  Progress Note  Patient Name: Jonathan Fowler Date of Encounter: 10/23/2024 [facility] Cardiologist: None   Interval Summary   CRT-D implanted yesterday. Some soreness overnight. Otherwise doing well with no additional complaints.  Vital Signs Vitals:   10/23/24 0417 10/23/24 0428 10/23/24 0458 10/23/24 0707  BP: (!) 152/102 (!) 156/102  (!) 146/105  Pulse: 78 81    Resp:      Temp: 98 F (36.7 C)   98.7 F (37.1 C)  TempSrc: Oral   Oral  SpO2: 92% 91%    Weight:   97.3 kg   Height:        Intake/Output Summary (Last 24 hours) at 10/23/2024 0818 Last data filed at 10/22/2024 1500 Gross per 24 hour  Intake 120 ml  Output 1450 ml  Net -1330 ml      10/23/2024    4:58 AM 10/22/2024    3:00 AM 10/21/2024    8:09 AM  Last 3 Weights  Weight (lbs) 214 lb 8.1 oz 217 lb 13 oz 220 lb 10.9 oz  Weight (kg) 97.3 kg 98.8 kg 100.1 kg      Telemetry/ECG  BiV paced - Personally Reviewed  Physical Exam  General: Well developed, in no acute distress.  Neck: No JVD.  Cardiac: Normal rate, regular rhythm. Left chest ICD pocket without bleeding or hematoma. Resp: Normal work of breathing.  Ext: No edema.  Neuro: No gross focal deficits.  Psych: Normal affect.   Assessment & Plan   #S/p CRT-D #Acute on chronic systolic heart failure #Nonischemic cardiomyopathy. -CXR with appropriate lead positions and no pneumothorax by my interpretation. -Bedside device interrogation with appropriate device function and stable lead parameters.  -Usual post implant discharge instructions were provided regarding activity restrictions and wound care.  -Follow up in device clinic in ~2 weeks. -Management of HF per our HF colleagues.    #PVCs: PVC burden improved on telemetry.  Will monitor with CRT D.  May need to initiate amiodarone .  If formal radiology read on CXR shows no complications, then okay for discharge from EP perspective. HF team to ultimately determine appropriateness.    Signed, Fonda Kitty, MD   "

## 2024-10-23 NOTE — Plan of Care (Signed)

## 2024-10-23 NOTE — Plan of Care (Signed)
" °  Problem: Education: Goal: Knowledge of General Education information will improve Description: Including pain rating scale, medication(s)/side effects and non-pharmacologic comfort measures Outcome: Progressing   Problem: Health Behavior/Discharge Planning: Goal: Ability to manage health-related needs will improve Outcome: Progressing   Problem: Clinical Measurements: Goal: Ability to maintain clinical measurements within normal limits will improve Outcome: Progressing Goal: Will remain free from infection Outcome: Progressing Goal: Diagnostic test results will improve Outcome: Progressing Goal: Respiratory complications will improve Outcome: Progressing Goal: Cardiovascular complication will be avoided Outcome: Progressing   Problem: Activity: Goal: Risk for activity intolerance will decrease Outcome: Progressing   Problem: Nutrition: Goal: Adequate nutrition will be maintained Outcome: Progressing   Problem: Coping: Goal: Level of anxiety will decrease Outcome: Progressing   Problem: Elimination: Goal: Will not experience complications related to bowel motility Outcome: Progressing Goal: Will not experience complications related to urinary retention Outcome: Progressing   Problem: Pain Managment: Goal: General experience of comfort will improve and/or be controlled Outcome: Progressing   Problem: Safety: Goal: Ability to remain free from injury will improve Outcome: Progressing   Problem: Skin Integrity: Goal: Risk for impaired skin integrity will decrease Outcome: Progressing   Problem: Education: Goal: Understanding of CV disease, CV risk reduction, and recovery process will improve Outcome: Progressing   Problem: Activity: Goal: Ability to return to baseline activity level will improve Outcome: Progressing   Problem: Cardiovascular: Goal: Ability to achieve and maintain adequate cardiovascular perfusion will improve Outcome: Progressing Goal:  Vascular access site(s) Level 0-1 will be maintained Outcome: Progressing   Problem: Education: Goal: Knowledge of cardiac device and self-care will improve Outcome: Progressing   Problem: Cardiac: Goal: Ability to achieve and maintain adequate cardiopulmonary perfusion will improve Outcome: Progressing   "

## 2024-10-24 ENCOUNTER — Other Ambulatory Visit: Payer: Self-pay

## 2024-10-24 ENCOUNTER — Encounter (HOSPITAL_COMMUNITY): Payer: Self-pay | Admitting: Cardiology

## 2024-10-24 LAB — CBC
HCT: 35.1 % — ABNORMAL LOW (ref 39.0–52.0)
Hemoglobin: 11.9 g/dL — ABNORMAL LOW (ref 13.0–17.0)
MCH: 32.1 pg (ref 26.0–34.0)
MCHC: 33.9 g/dL (ref 30.0–36.0)
MCV: 94.6 fL (ref 80.0–100.0)
Platelets: 109 K/uL — ABNORMAL LOW (ref 150–400)
RBC: 3.71 MIL/uL — ABNORMAL LOW (ref 4.22–5.81)
RDW: 16.2 % — ABNORMAL HIGH (ref 11.5–15.5)
WBC: 7 K/uL (ref 4.0–10.5)
nRBC: 0 % (ref 0.0–0.2)

## 2024-10-24 LAB — BASIC METABOLIC PANEL WITH GFR
Anion gap: 11 (ref 5–15)
BUN: 37 mg/dL — ABNORMAL HIGH (ref 8–23)
CO2: 28 mmol/L (ref 22–32)
Calcium: 9.4 mg/dL (ref 8.9–10.3)
Chloride: 97 mmol/L — ABNORMAL LOW (ref 98–111)
Creatinine, Ser: 2.92 mg/dL — ABNORMAL HIGH (ref 0.61–1.24)
GFR, Estimated: 23 mL/min — ABNORMAL LOW
Glucose, Bld: 129 mg/dL — ABNORMAL HIGH (ref 70–99)
Potassium: 4.3 mmol/L (ref 3.5–5.1)
Sodium: 136 mmol/L (ref 135–145)

## 2024-10-24 LAB — LACTIC ACID, PLASMA: Lactic Acid, Venous: 2.1 mmol/L (ref 0.5–1.9)

## 2024-10-24 LAB — MAGNESIUM: Magnesium: 2.3 mg/dL (ref 1.7–2.4)

## 2024-10-24 MED ORDER — MILRINONE LACTATE IN DEXTROSE 20-5 MG/100ML-% IV SOLN
0.2500 ug/kg/min | INTRAVENOUS | Status: DC
  Administered 2024-10-24 – 2024-10-26 (×5): 0.25 ug/kg/min via INTRAVENOUS
  Filled 2024-10-24 (×5): qty 100

## 2024-10-24 MED ORDER — HYDRALAZINE HCL 25 MG PO TABS
25.0000 mg | ORAL_TABLET | Freq: Three times a day (TID) | ORAL | Status: DC
  Administered 2024-10-24 – 2024-10-26 (×6): 25 mg via ORAL
  Filled 2024-10-24 (×6): qty 1

## 2024-10-24 NOTE — Progress Notes (Signed)
 Patient ID: Jonathan Fowler, male   DOB: June 23, 1956, 69 y.o.   MRN: [id number]     Advanced Heart Failure Rounding Note  HF Cardiologist: Dr. Zenaida  Chief Complaint: CHF  Subjective:   RHC 10/20/24 on 0.25 milrinone : RA mean 13, PA 54/21 (34), PCWP mean 27, CO/CI (TD) 5.1/2.3  I/Os 1710 cc net negative.  Creatinine 2.47 => 2.92.    MDT CRT-D device placed this admission.    He feels better today, coughing has resolved.   No NSVT, remains on amiodarone  200 bid.     Objective:   Weight Range: 98.7 kg Body mass index is 30.35 kg/m.   Vital Signs:   Temp:  [98.3 F (36.8 C)-99 F (37.2 C)] 98.3 F (36.8 C) (01/11 0816) Pulse Rate:  [72-88] 72 (01/11 0816) Resp:  [17-25] 20 (01/11 0816) BP: (122-168)/(98-126) 122/102 (01/11 0816) SpO2:  [89 %-99 %] 97 % (01/11 0816) Weight:  [98.7 kg] 98.7 kg (01/11 0344) Last BM Date : 10/21/24  Weight change: Filed Weights   10/22/24 0300 10/23/24 0458 10/24/24 0344  Weight: 98.8 kg 97.3 kg 98.7 kg    Intake/Output:   Intake/Output Summary (Last 24 hours) at 10/24/2024 0923 Last data filed at 10/24/2024 9372 Gross per 24 hour  Intake 240 ml  Output 1540 ml  Net -1300 ml     Physical Exam   General: NAD Neck: JVP 10 cm, no thyromegaly or thyroid  nodule.  Lungs: Clear to auscultation bilaterally with normal respiratory effort. CV: Nondisplaced PMI.  Heart regular S1/S2, no S3/S4, no murmur.  1+ edema to knees.  Abdomen: Soft, nontender, no hepatosplenomegaly, no distention.  Skin: Intact without lesions or rashes.  Neurologic: Alert and oriented x 3.  Psych: Normal affect. Extremities: No clubbing or cyanosis.  HEENT: Normal.   Telemetry   NSR with BiV pacing  (personally reviewed)  Labs   CBC Recent Labs    10/23/24 0236 10/24/24 0245  WBC 6.6 7.0  HGB 13.1 11.9*  HCT 39.6 35.1*  MCV 93.6 94.6  PLT 145* 109*   Basic Metabolic Panel Recent Labs    98/89/73 0236 10/24/24 0245  NA 137 136  K 4.3 4.3   CL 98 97*  CO2 27 28  GLUCOSE 92 129*  BUN 32* 37*  CREATININE 2.47* 2.92*  CALCIUM  9.3 9.4  MG 2.3 2.3   Liver Function Tests No results for input(s): AST, ALT, ALKPHOS, BILITOT, PROT, ALBUMIN in the last 72 hours.  No results for input(s): LIPASE, AMYLASE in the last 72 hours.  Cardiac Enzymes No results for input(s): CKTOTAL, CKMB, CKMBINDEX, TROPONINI in the last 72 hours.  BNP: BNP (last 3 results) Recent Labs    06/29/24 1412 08/30/24 1923 09/27/24 0953  BNP 1,746.1* >4,500.0* >4,500.0*    ProBNP (last 3 results) Recent Labs    10/18/24 1439  PROBNP 19,343.0*     D-Dimer No results for input(s): DDIMER in the last 72 hours. Hemoglobin A1C No results for input(s): HGBA1C in the last 72 hours. Fasting Lipid Panel No results for input(s): CHOL, HDL, LDLCALC, TRIG, CHOLHDL, LDLDIRECT in the last 72 hours. Medications:   Scheduled Medications:  amiodarone   200 mg Oral BID   aspirin  EC  81 mg Oral Daily   atorvastatin   10 mg Oral Daily   Chlorhexidine  Gluconate Cloth  6 each Topical Daily   empagliflozin   10 mg Oral Daily   heparin   5,000 Units Subcutaneous Q8H   hydrALAZINE   25 mg Oral Q8H  sodium chloride  flush  10-40 mL Intracatheter Q12H   sodium chloride  flush  3 mL Intravenous Q12H   sodium chloride  flush  3 mL Intravenous Q12H    Infusions:    PRN Medications: acetaminophen , alum & mag hydroxide-simeth, guaiFENesin -dextromethorphan , melatonin, ondansetron  (ZOFRAN ) IV, sodium chloride  flush, sodium chloride  flush  Assessment/Plan   1. Acute on chronic systolic CHF: Biventricular failure, suspect nearing end stage.  NICM.  Cath 9/25 with nonobstructive CAD and preserved CI.  cMRI 9/25 with LV EF 16%, RV EF 19%, basal septal LGE (nonspecific, seen with dilated CMP).  Echo in 12/25 with EF 18%, severe LV dilation, severe RV dysfunction, severe biatrial enlargement, IVC dilated. CPX with moderate-severe  functional impairment due to HF. NYHA class IV on admission with lactic acid of 2.  RHC 10/20/24 on 0.25 milrinone : RA mean 13, PA 54/21 (34), PCWP mean 27, CO/CI (TD) 5.1/2.3.  - Has wide RBBB (QRS > 200 ms), seen by EP => MDT CRT-D device placed.  - Milrinone  off and PICC out.  - JVP 10 today.  Creatinine up to 2.47 => 2.92.  I am concerned that he has not tolerated coming off milrinone  and may be inotrope dependent.  I will replace PICC, check co-ox and CVP today.  Also send lactate.  If co-ox low or lactate high, will need to restart milrinone  0.25.  - Hold Entresto  and spironolactone  with AKI.  - Restart hydralazine  25 mg tid, had headache with Imdur .  - Can continue Jardiance  for now.  - Hold torsemide  for now with AKI, will likely need IV diuresis after milrinone  restarted. Follow CVP after PICC placed.   - Continue to hold beta blocker with low output.  - Restart Jardiance  10 mg daily.  - Suspect he has end stage biventricular cardiomyopathy.  Concerned he won't be a very good LVAD candidate with significant RV failure and elevated creatinine.  Heart/kidney transplant would be a consideration but he will have a narrow window for this. Will see how he does post CRT-D.  2. AKI on CKD stage 3b: Creatinine higher at 2.47 => 2.92 today.  Suspect cardiorenal syndrome.  - As above, placing PICC and will check co-ox/lactate.  May need to restart milrinone .   3. DM2: SSI.   4. HTN: Holding Entresto  and spironolactone  with AKI.  Starting hydralazine  25 tid and can titrate up.   5. Iron  deficiency anemia - T sat 17% - Given venofer   6. PVCs/NSVT - May be 2/2 inotrope support - Started po amiodarone  200 BID  Mobilize.   Length of Stay: 6  Ezra Shuck, MD  10/24/2024, 9:23 AM  Advanced Heart Failure Team Pager [phone] (M-F; 7a - 5p)   Please visit [email or web address]: For overnight coverage please call cardiology fellow first. If fellow not available call Shock/ECMO MD on call.  For ECMO /  Mechanical Support (Impella, IABP, LVAD) issues call Shock / ECMO MD on call.

## 2024-10-24 NOTE — Progress Notes (Signed)
 There was order for placing PICC line. ICD is on Lt. Chest, so can't go through Lt. Arm. Only Rt. Arm for PICC. Assessed Rt. Upper arm. No identified cephalic vein, brachial vein (previous PICC line)- partial compressible and compressible at the axillary line, basilic vein is small. Explained patient why I couldn't put in the PICC. Informed patient's nurse Comer this matter. Recommended IR or central line for this patient. HS [employer]

## 2024-10-24 NOTE — Plan of Care (Signed)
" °  Problem: Education: Goal: Knowledge of General Education information will improve Description: Including pain rating scale, medication(s)/side effects and non-pharmacologic comfort measures Outcome: Progressing   Problem: Health Behavior/Discharge Planning: Goal: Ability to manage health-related needs will improve Outcome: Progressing   Problem: Clinical Measurements: Goal: Will remain free from infection Outcome: Progressing Goal: Respiratory complications will improve Outcome: Progressing   Problem: Activity: Goal: Risk for activity intolerance will decrease Outcome: Progressing   Problem: Nutrition: Goal: Adequate nutrition will be maintained Outcome: Progressing   Problem: Coping: Goal: Level of anxiety will decrease Outcome: Progressing   Problem: Elimination: Goal: Will not experience complications related to bowel motility Outcome: Progressing Goal: Will not experience complications related to urinary retention Outcome: Progressing   Problem: Pain Managment: Goal: General experience of comfort will improve and/or be controlled Outcome: Progressing   Problem: Clinical Measurements: Goal: Ability to maintain clinical measurements within normal limits will improve Outcome: Not Progressing Goal: Diagnostic test results will improve Outcome: Not Progressing Goal: Cardiovascular complication will be avoided Outcome: Not Progressing   "

## 2024-10-25 ENCOUNTER — Inpatient Hospital Stay (HOSPITAL_COMMUNITY)

## 2024-10-25 ENCOUNTER — Encounter (HOSPITAL_COMMUNITY)

## 2024-10-25 HISTORY — PX: IR TUNNELED CENTRAL VENOUS CATH PLC W IMG: IMG1939

## 2024-10-25 LAB — BASIC METABOLIC PANEL WITH GFR
Anion gap: 10 (ref 5–15)
BUN: 39 mg/dL — ABNORMAL HIGH (ref 8–23)
CO2: 29 mmol/L (ref 22–32)
Calcium: 8.6 mg/dL — ABNORMAL LOW (ref 8.9–10.3)
Chloride: 98 mmol/L (ref 98–111)
Creatinine, Ser: 2.71 mg/dL — ABNORMAL HIGH (ref 0.61–1.24)
GFR, Estimated: 25 mL/min — ABNORMAL LOW
Glucose, Bld: 95 mg/dL (ref 70–99)
Potassium: 3.6 mmol/L (ref 3.5–5.1)
Sodium: 137 mmol/L (ref 135–145)

## 2024-10-25 LAB — CBC
HCT: 30.8 % — ABNORMAL LOW (ref 39.0–52.0)
Hemoglobin: 10.4 g/dL — ABNORMAL LOW (ref 13.0–17.0)
MCH: 31.9 pg (ref 26.0–34.0)
MCHC: 33.8 g/dL (ref 30.0–36.0)
MCV: 94.5 fL (ref 80.0–100.0)
Platelets: 90 K/uL — ABNORMAL LOW (ref 150–400)
RBC: 3.26 MIL/uL — ABNORMAL LOW (ref 4.22–5.81)
RDW: 15.9 % — ABNORMAL HIGH (ref 11.5–15.5)
WBC: 5.5 K/uL (ref 4.0–10.5)
nRBC: 0 % (ref 0.0–0.2)

## 2024-10-25 LAB — COOXEMETRY PANEL
Carboxyhemoglobin: 2.2 % — ABNORMAL HIGH (ref 0.5–1.5)
Methemoglobin: <0.7 % (ref 0.0–1.5)
O2 Saturation: 70.9 %
Total hemoglobin: 11.2 g/dL — ABNORMAL LOW (ref 12.0–16.0)

## 2024-10-25 LAB — LACTIC ACID, PLASMA: Lactic Acid, Venous: 0.8 mmol/L (ref 0.5–1.9)

## 2024-10-25 LAB — MAGNESIUM: Magnesium: 2.2 mg/dL (ref 1.7–2.4)

## 2024-10-25 MED ORDER — POTASSIUM CHLORIDE CRYS ER 20 MEQ PO TBCR
40.0000 meq | EXTENDED_RELEASE_TABLET | Freq: Two times a day (BID) | ORAL | Status: AC
  Administered 2024-10-25 (×2): 40 meq via ORAL
  Filled 2024-10-25 (×2): qty 2

## 2024-10-25 MED ORDER — FUROSEMIDE 10 MG/ML IJ SOLN
80.0000 mg | Freq: Two times a day (BID) | INTRAMUSCULAR | Status: AC
  Administered 2024-10-25 (×2): 80 mg via INTRAVENOUS
  Filled 2024-10-25 (×2): qty 8

## 2024-10-25 MED ORDER — LIDOCAINE-EPINEPHRINE 1 %-1:100000 IJ SOLN
20.0000 mL | Freq: Once | INTRAMUSCULAR | Status: AC
  Administered 2024-10-25: 15 mL via INTRADERMAL

## 2024-10-25 MED ORDER — HEPARIN SOD (PORK) LOCK FLUSH 100 UNIT/ML IV SOLN
INTRAVENOUS | Status: AC
  Filled 2024-10-25: qty 5

## 2024-10-25 MED ORDER — LIDOCAINE-EPINEPHRINE 1 %-1:100000 IJ SOLN
INTRAMUSCULAR | Status: AC
  Filled 2024-10-25: qty 1

## 2024-10-25 MED FILL — Midazolam HCl Inj 2 MG/2ML (Base Equivalent): INTRAMUSCULAR | Qty: 2 | Status: AC

## 2024-10-25 NOTE — Plan of Care (Signed)
   Problem: Health Behavior/Discharge Planning: Goal: Ability to manage health-related needs will improve Outcome: Progressing   Problem: Clinical Measurements: Goal: Ability to maintain clinical measurements within normal limits will improve Outcome: Progressing Goal: Will remain free from infection Outcome: Progressing

## 2024-10-25 NOTE — Progress Notes (Addendum)
 Patient ID: Jonathan Fowler, male   DOB: 10-09-1956, 69 y.o.   MRN: [id number]     Advanced Heart Failure Rounding Note  HF Cardiologist: Dr. Zenaida  Chief Complaint: CHF  Subjective:   RHC 10/20/24 on 0.25 milrinone : RA mean 13, PA 54/21 (34), PCWP mean 27, CO/CI (TD) 5.1/2.3  I/Os 1710 cc net negative.  Creatinine 2.47 => 2.92.    MDT CRT-D device placed this admission.    Failed Milrinone  wean, restarted at 0.25 mcg/kg/min.  No central access for Co-ox/CVP measurement (IV team unable to place PICC). Scr trending back down today, 2.9>>2.7. 1.9L in UOP yesterday. Wt up 3 lb.   Feels ok today but still w/ HA. No resting dyspnea, continues w/ cough but improving.   Objective:   Weight Range: 98.5 kg Body mass index is 30.29 kg/m.   Vital Signs:   Temp:  [98.1 F (36.7 C)-99.2 F (37.3 C)] 98.4 F (36.9 C) (01/12 0300) Pulse Rate:  [72-96] 75 (01/12 0300) Resp:  [17-34] 19 (01/12 0300) BP: (111-134)/(70-102) 111/70 (01/12 0300) SpO2:  [96 %-98 %] 98 % (01/12 0300) Weight:  [98.5 kg] 98.5 kg (01/12 0300) Last BM Date : 10/21/24  Weight change: Filed Weights   10/23/24 0458 10/24/24 0344 10/25/24 0300  Weight: 97.3 kg 98.7 kg 98.5 kg    Intake/Output:   Intake/Output Summary (Last 24 hours) at 10/25/2024 0715 Last data filed at 10/24/2024 2300 Gross per 24 hour  Intake 95.53 ml  Output 1850 ml  Net -1754.47 ml     Physical Exam   GENERAL: NAD Lungs- diminished at bases  CARDIAC:  JVP: 8-9 m          Normal rate with regular rhythm. No MRG. Trace b/l ankle edema ABDOMEN: Soft, non-tender, non-distended.  EXTREMITIES: Warm and well perfused.  NEUROLOGIC: No obvious FND   Telemetry   NSR with BiV pacing, PVCs, 1 8 beat run of NSVT  (personally reviewed)  Labs   CBC Recent Labs    10/24/24 0245 10/25/24 0215  WBC 7.0 5.5  HGB 11.9* 10.4*  HCT 35.1* 30.8*  MCV 94.6 94.5  PLT 109* 90*   Basic Metabolic Panel Recent Labs    98/88/73 0245  10/25/24 0215  NA 136 137  K 4.3 3.6  CL 97* 98  CO2 28 29  GLUCOSE 129* 95  BUN 37* 39*  CREATININE 2.92* 2.71*  CALCIUM  9.4 8.6*  MG 2.3 2.2   Liver Function Tests No results for input(s): AST, ALT, ALKPHOS, BILITOT, PROT, ALBUMIN in the last 72 hours.  No results for input(s): LIPASE, AMYLASE in the last 72 hours.  Cardiac Enzymes No results for input(s): CKTOTAL, CKMB, CKMBINDEX, TROPONINI in the last 72 hours.  BNP: BNP (last 3 results) Recent Labs    06/29/24 1412 08/30/24 1923 09/27/24 0953  BNP 1,746.1* >4,500.0* >4,500.0*    ProBNP (last 3 results) Recent Labs    10/18/24 1439  PROBNP 19,343.0*     D-Dimer No results for input(s): DDIMER in the last 72 hours. Hemoglobin A1C No results for input(s): HGBA1C in the last 72 hours. Fasting Lipid Panel No results for input(s): CHOL, HDL, LDLCALC, TRIG, CHOLHDL, LDLDIRECT in the last 72 hours. Medications:   Scheduled Medications:  amiodarone   200 mg Oral BID   aspirin  EC  81 mg Oral Daily   atorvastatin   10 mg Oral Daily   Chlorhexidine  Gluconate Cloth  6 each Topical Daily   empagliflozin   10 mg Oral Daily  heparin   5,000 Units Subcutaneous Q8H   hydrALAZINE   25 mg Oral Q8H   sodium chloride  flush  10-40 mL Intracatheter Q12H   sodium chloride  flush  3 mL Intravenous Q12H   sodium chloride  flush  3 mL Intravenous Q12H    Infusions:  milrinone  0.25 mcg/kg/min (10/24/24 2330)     PRN Medications: acetaminophen , alum & mag hydroxide-simeth, guaiFENesin -dextromethorphan , melatonin, ondansetron  (ZOFRAN ) IV, sodium chloride  flush, sodium chloride  flush  Assessment/Plan   1. Acute on chronic systolic CHF: Biventricular failure, suspect nearing end stage.  NICM.  Cath 9/25 with nonobstructive CAD and preserved CI.  cMRI 9/25 with LV EF 16%, RV EF 19%, basal septal LGE (nonspecific, seen with dilated CMP).  Echo in 12/25 with EF 18%, severe LV dilation, severe RV  dysfunction, severe biatrial enlargement, IVC dilated. CPX with moderate-severe functional impairment due to HF. NYHA class IV on admission with lactic acid of 2.  RHC 10/20/24 on 0.25 milrinone : RA mean 13, PA 54/21 (34), PCWP mean 27, CO/CI (TD) 5.1/2.3.  - Has wide RBBB (QRS > 200 ms), seen by EP => MDT CRT-D device placed.  - Failed Milrinone  wean, restarted at 0.25 mcg/min. Inotrope dependent, will plan home milrinone . Unable to place UE PICC. Will need IR tunneled picc for home. Place IR consult today  - restart IV Lasix  80 mg bid   - Hold Entresto  and spironolactone  with AKI.  - Continue hydralazine  25 mg tid, had headache with Imdur .  - Can continue Jardiance  for now.  - Continue to hold beta blocker with low output.  - Suspect he has end stage biventricular cardiomyopathy.  Concerned he won't be a very good LVAD candidate with significant RV failure and elevated creatinine.  Heart/kidney transplant would be a consideration but he will have a narrow window for this. Will see how he does post CRT-D.  2. AKI on CKD stage 3b: Creatinine higher at 2.47 => 2.92=>2.7 today.  Suspect cardiorenal syndrome.  - As above, continue milrinone  and diuresis   3. DM2: SSI.   4. HTN: Holding Entresto  and spironolactone  with AKI. Continue hydralazine  25 tid and can titrate up.   5. Iron  deficiency anemia - T sat 17% - Given venofer   6. PVCs/NSVT - May be 2/2 inotrope support - Started po amiodarone  200 BID    Length of Stay: [address], PA-C  10/25/2024, 7:15 AM  Advanced Heart Failure Team Pager [phone] (M-F; 7a - 5p)   Please visit [email or web address]: For overnight coverage please call cardiology fellow first. If fellow not available call Shock/ECMO MD on call.  For ECMO / Mechanical Support (Impella, IABP, LVAD) issues call Shock / ECMO MD on call.    Patient seen with PA, I formulated the plan and agree with the above note.   Unable to place PICC yesterday. However, lactate was 2.1  and decreased to 0.8 after milrinone  0.25 restarted.  Creatinine 2.9 => 2.71.   Feels good, no dyspnea.   General: NAD Neck: JVP 8-9 cm, no thyromegaly or thyroid  nodule.  Lungs: Clear to auscultation bilaterally with normal respiratory effort. CV: Nondisplaced PMI.  Heart regular S1/S2, no S3/S4, no murmur.  1+ ankle edema.   Abdomen: Soft, nontender, no hepatosplenomegaly, no distention.  Skin: Intact without lesions or rashes.  Neurologic: Alert and oriented x 3.  Psych: Normal affect. Extremities: No clubbing or cyanosis.  HEENT: Normal.   Patient failed milrinone  wean with rise in creatinine and elevated lactate (2.1 yesterday).  He was restarted  on milrinone  0.25, creatinine lower at 2.7 today and lactate normal.  Mild volume overload on exam.  - Lasix  80 mg IV bid x 2 doses today.  - Continue milrinone  0.25 mcg/kg/min.  He will go to IR for tunneled PICC today, unable to place PICC yesterday at bedside.  - Continue hydralazine .  Unable to tolerate Imdur .  No Entresto  or spironolactone  for now with AKI.  - He will need to go home with milrinone , discussed workup for heart/kidney transplant. He is not sure he would want this but will consider.   Ezra Shuck 10/25/2024 9:22 AM

## 2024-10-25 NOTE — Procedures (Signed)
 Interventional Radiology Procedure Note  Procedure: Tunneled PICC placement  Complications: None  Estimated Blood Loss: < 10 mL  Findings: Right jugular vein access for placement of a SL PICC line.  Catheter tip at cavoatrial junction.  Cordella DELENA Banner, MD

## 2024-10-25 NOTE — Progress Notes (Signed)
" °  IR BRIEF PROGRESS NOTE:  Patient presents to IR service for tunneled central venous catheter placement for outpatient milrinone  administration. Patient was seen and consented. No barriers to proceeding. Tunneled line is available for immediate use once placed.   Electronically Signed: Carlin DELENA Griffon, PA-C 10/25/2024, 9:35 AM      "

## 2024-10-26 ENCOUNTER — Inpatient Hospital Stay (HOSPITAL_COMMUNITY)

## 2024-10-26 ENCOUNTER — Other Ambulatory Visit (HOSPITAL_COMMUNITY): Payer: Self-pay

## 2024-10-26 HISTORY — PX: IR TUNNELED CENTRAL VENOUS CATH PLC W IMG: IMG1939

## 2024-10-26 LAB — COOXEMETRY PANEL
Carboxyhemoglobin: 2.3 % — ABNORMAL HIGH (ref 0.5–1.5)
Methemoglobin: 0.7 % (ref 0.0–1.5)
O2 Saturation: 72 %
Total hemoglobin: 10.7 g/dL — ABNORMAL LOW (ref 12.0–16.0)

## 2024-10-26 LAB — CBC
HCT: 30.6 % — ABNORMAL LOW (ref 39.0–52.0)
Hemoglobin: 10.3 g/dL — ABNORMAL LOW (ref 13.0–17.0)
MCH: 31.3 pg (ref 26.0–34.0)
MCHC: 33.7 g/dL (ref 30.0–36.0)
MCV: 93 fL (ref 80.0–100.0)
Platelets: 123 K/uL — ABNORMAL LOW (ref 150–400)
RBC: 3.29 MIL/uL — ABNORMAL LOW (ref 4.22–5.81)
RDW: 15.5 % (ref 11.5–15.5)
WBC: 4.6 K/uL (ref 4.0–10.5)
nRBC: 0 % (ref 0.0–0.2)

## 2024-10-26 LAB — BASIC METABOLIC PANEL WITH GFR
Anion gap: 10 (ref 5–15)
BUN: 34 mg/dL — ABNORMAL HIGH (ref 8–23)
CO2: 30 mmol/L (ref 22–32)
Calcium: 9 mg/dL (ref 8.9–10.3)
Chloride: 99 mmol/L (ref 98–111)
Creatinine, Ser: 2.29 mg/dL — ABNORMAL HIGH (ref 0.61–1.24)
GFR, Estimated: 30 mL/min — ABNORMAL LOW
Glucose, Bld: 92 mg/dL (ref 70–99)
Potassium: 3.6 mmol/L (ref 3.5–5.1)
Sodium: 139 mmol/L (ref 135–145)

## 2024-10-26 LAB — MAGNESIUM: Magnesium: 2.3 mg/dL (ref 1.7–2.4)

## 2024-10-26 MED ORDER — LIDOCAINE-EPINEPHRINE 1 %-1:100000 IJ SOLN
10.0000 mL | Freq: Once | INTRAMUSCULAR | Status: AC
  Administered 2024-10-26: 10 mL via INTRADERMAL

## 2024-10-26 MED ORDER — LIDOCAINE-EPINEPHRINE 1 %-1:100000 IJ SOLN
INTRAMUSCULAR | Status: AC
  Filled 2024-10-26: qty 1

## 2024-10-26 MED ORDER — SPIRONOLACTONE 25 MG PO TABS
12.5000 mg | ORAL_TABLET | Freq: Every day | ORAL | 11 refills | Status: DC
  Filled 2024-10-26: qty 15, 30d supply, fill #0

## 2024-10-26 MED ORDER — MILRINONE LACTATE IN DEXTROSE 20-5 MG/100ML-% IV SOLN: 0.2500 ug/kg/min | INTRAVENOUS | Status: AC

## 2024-10-26 MED ORDER — POTASSIUM CHLORIDE CRYS ER 20 MEQ PO TBCR: 40.0000 meq | EXTENDED_RELEASE_TABLET | Freq: Once | ORAL | Status: DC

## 2024-10-26 MED ORDER — AMIODARONE HCL 200 MG PO TABS
200.0000 mg | ORAL_TABLET | Freq: Every day | ORAL | Status: DC
  Administered 2024-10-26: 200 mg via ORAL
  Filled 2024-10-26: qty 1

## 2024-10-26 MED ORDER — HEPARIN SOD (PORK) LOCK FLUSH 100 UNIT/ML IV SOLN
INTRAVENOUS | Status: AC
  Filled 2024-10-26: qty 5

## 2024-10-26 MED ORDER — ASPIRIN 81 MG PO TBEC
81.0000 mg | DELAYED_RELEASE_TABLET | Freq: Every day | ORAL | 12 refills | Status: AC
  Filled 2024-10-26: qty 30, 30d supply, fill #0

## 2024-10-26 MED ORDER — POTASSIUM CHLORIDE CRYS ER 20 MEQ PO TBCR
40.0000 meq | EXTENDED_RELEASE_TABLET | ORAL | Status: AC
  Administered 2024-10-26: 40 meq via ORAL
  Filled 2024-10-26: qty 2

## 2024-10-26 MED ORDER — AMIODARONE HCL 200 MG PO TABS
200.0000 mg | ORAL_TABLET | Freq: Every day | ORAL | 3 refills | Status: DC
  Filled 2024-10-26: qty 90, 90d supply, fill #0

## 2024-10-26 MED ORDER — TORSEMIDE 20 MG PO TABS
40.0000 mg | ORAL_TABLET | Freq: Two times a day (BID) | ORAL | 11 refills | Status: DC
  Filled 2024-10-26: qty 140, 30d supply, fill #0

## 2024-10-26 MED ORDER — SPIRONOLACTONE 12.5 MG HALF TABLET
12.5000 mg | ORAL_TABLET | Freq: Every day | ORAL | Status: DC
  Administered 2024-10-26: 12.5 mg via ORAL
  Filled 2024-10-26: qty 1

## 2024-10-26 MED ORDER — POTASSIUM CHLORIDE CRYS ER 20 MEQ PO TBCR
60.0000 meq | EXTENDED_RELEASE_TABLET | Freq: Once | ORAL | 5 refills | Status: DC
  Filled 2024-10-26: qty 90, 30d supply, fill #0

## 2024-10-26 MED ORDER — HYDRALAZINE HCL 25 MG PO TABS
25.0000 mg | ORAL_TABLET | Freq: Three times a day (TID) | ORAL | 11 refills | Status: DC
  Filled 2024-10-26: qty 90, 30d supply, fill #0

## 2024-10-26 NOTE — Procedures (Signed)
 Vascular and Interventional Radiology Procedure Note  Patient: Jonathan Fowler DOB: 26-Oct-1955 Medical Record Number: [id number] Note Date/Time: 10/26/2024 2:54 PM   Performing Physician: Thom Hall, MD Assistant(s): None  Diagnosis: Poor IV access  Procedure: TUNNELED CENTRAL VENOUS CATHETER PLACEMENT  Anesthesia: Local Anesthetic Complications: None Estimated Blood Loss: Minimal Specimens:  None  Findings:  Successful placement of right-sided, 26 cm (tip-to-cuff), tunneled Powerline CVC with the tip of the catheter in the proximal right atrium.  Plan: Catheter ready for use.  See detailed procedure note with images in PACS. The patient tolerated the procedure well without incident or complication and was returned to Recovery in stable condition.    Thom Hall, MD [facility]   Pager. [phone] Clinic. [phone]

## 2024-10-26 NOTE — TOC Progression Note (Addendum)
 Transition of Care [facility]) - Progression Note    Patient Details  Name: Jonathan Fowler MRN: [id number] Date of Birth: [birth year]  [facility] CM/SW Contact  Waddell Barnie Rama, RN Phone Number: 10/26/2024, 12:46 PM  Clinical Narrative:    Per Holley with [employer], she will be here betwwen 2 and 3 to hook up patient for home milrinone , Pam will have a HHRN with [employer] to do IV Picc care.   His PCP apt has been rescheduled for 1/21 at 1 pm.   Expected Discharge Plan: Home w Home Health Services Barriers to Discharge: Continued Medical Work up               Expected Discharge Plan and Services       Living arrangements for the past 2 months: Single Family Home                                       Social Drivers of Health (SDOH) Interventions SDOH Screenings   Food Insecurity: No Food Insecurity (10/19/2024)  Housing: Low Risk (10/19/2024)  Transportation Needs: No Transportation Needs (10/19/2024)  Utilities: Not At Risk (10/19/2024)  Depression (PHQ2-9): Low Risk (08/02/2024)  Social Connections: Moderately Isolated (10/19/2024)  Tobacco Use: Low Risk (10/18/2024)    Readmission Risk Interventions    07/07/2024   11:26 AM  Readmission Risk Prevention Plan  Post Dischage Appt Complete  Medication Screening Complete  Transportation Screening Complete

## 2024-10-26 NOTE — Progress Notes (Signed)
 RN called to room by another nurse that patient had come out of his room at the door looking for me because his right internal jugular tunneled catheter had come out. I went to room and patient had the line in his hands, and said he was coming out of the bathroom when he noticed it being out. Patient placed backed in bed, he is stable, rounding team and vascular made aware. New orders placed for new CVC, vascular at bedside to access site and sign consent. Patient is schedule to go home with Milrinone  infusion.

## 2024-10-26 NOTE — TOC Progression Note (Addendum)
 Transition of Care [facility]) - Progression Note    Patient Details  Name: AARAV BURGETT MRN: [id number] Date of Birth: [birth year]  Transition [facility]) CM/SW Contact  Arlana JINNY Nicholaus [place] Phone Number: [phone] 10/26/2024, 11:10 AM  Clinical Narrative:   HF CSW reviewed patients chart for discharge readiness, patient not medically stable for d/c. Per medical team during progression rounds the plan is for the patient to dc home with milrinone . ICM will continue to follow.   HF CSW/CM will continue to follow and monitor for dc readiness.       Expected Discharge Plan: Home w Home Health Services Barriers to Discharge: Continued Medical Work up               Expected Discharge Plan and Services       Living arrangements for the past 2 months: Single Family Home                                       Social Drivers of Health (SDOH) Interventions SDOH Screenings   Food Insecurity: No Food Insecurity (10/19/2024)  Housing: Low Risk (10/19/2024)  Transportation Needs: No Transportation Needs (10/19/2024)  Utilities: Not At Risk (10/19/2024)  Depression (PHQ2-9): Low Risk (08/02/2024)  Social Connections: Moderately Isolated (10/19/2024)  Tobacco Use: Low Risk (10/18/2024)    Readmission Risk Interventions    07/07/2024   11:26 AM  Readmission Risk Prevention Plan  Post Dischage Appt Complete  Medication Screening Complete  Transportation Screening Complete

## 2024-10-26 NOTE — Progress Notes (Signed)
 Patient is in IR and when he comes back he will be hooked up IV Meds. The wife is at bedside.

## 2024-10-26 NOTE — Progress Notes (Signed)
 Patient ID: Jonathan Fowler, male   DOB: 1956-06-07, 69 y.o.   MRN: [id number]     Advanced Heart Failure Rounding Note  HF Cardiologist: Dr. Zenaida  Chief Complaint: CHF  Subjective:   RHC 10/20/24 on 0.25 milrinone : RA mean 13, PA 54/21 (34), PCWP mean 27, CO/CI (TD) 5.1/2.3  I/Os 3600 cc net negative.  Creatinine 2.47 => 2.92 => 2.7 => 2.29. CVP 6 today.   MDT CRT-D device placed this admission.    Failed Milrinone  wean with rise in creatinine and lactate, restarted at 0.25 mcg/kg/min. This morning, co-ox 72%.   Feeling better overall, dyspnea and cough have resolved.    Objective:   Weight Range: 94.4 kg Body mass index is 29.04 kg/m.   Vital Signs:   Temp:  [97.9 F (36.6 C)-98.5 F (36.9 C)] 98 F (36.7 C) (01/13 0409) Pulse Rate:  [73-81] 73 (01/13 0409) Resp:  [20] 20 (01/13 0409) BP: (118-135)/(64-85) 129/81 (01/13 0409) SpO2:  [94 %-97 %] 95 % (01/13 0409) Weight:  [94.4 kg] 94.4 kg (01/13 0409) Last BM Date : 10/23/24  Weight change: Filed Weights   10/24/24 0344 10/25/24 0300 10/26/24 0409  Weight: 98.7 kg 98.5 kg 94.4 kg    Intake/Output:   Intake/Output Summary (Last 24 hours) at 10/26/2024 0726 Last data filed at 10/26/2024 0412 Gross per 24 hour  Intake 477 ml  Output 6700 ml  Net -6223 ml     Physical Exam  General: NAD Neck: No JVD, no thyromegaly or thyroid  nodule.  Lungs: Clear to auscultation bilaterally with normal respiratory effort. CV: Nondisplaced PMI.  Heart regular S1/S2, no S3/S4, no murmur.  No peripheral edema.   Abdomen: Soft, nontender, no hepatosplenomegaly, no distention.  Skin: Intact without lesions or rashes.  Neurologic: Alert and oriented x 3.  Psych: Normal affect. Extremities: No clubbing or cyanosis.  HEENT: Normal.    Telemetry   NSR with BiV pacing, occasional PVCs  (personally reviewed)  Labs   CBC Recent Labs    10/25/24 0215 10/26/24 0557  WBC 5.5 4.6  HGB 10.4* 10.3*  HCT 30.8* 30.6*  MCV  94.5 93.0  PLT 90* 123*   Basic Metabolic Panel Recent Labs    98/87/73 0215 10/26/24 0557  NA 137 139  K 3.6 3.6  CL 98 99  CO2 29 30  GLUCOSE 95 92  BUN 39* 34*  CREATININE 2.71* 2.29*  CALCIUM  8.6* 9.0  MG 2.2 2.3   Liver Function Tests No results for input(s): AST, ALT, ALKPHOS, BILITOT, PROT, ALBUMIN in the last 72 hours.  No results for input(s): LIPASE, AMYLASE in the last 72 hours.  Cardiac Enzymes No results for input(s): CKTOTAL, CKMB, CKMBINDEX, TROPONINI in the last 72 hours.  BNP: BNP (last 3 results) Recent Labs    06/29/24 1412 08/30/24 1923 09/27/24 0953  BNP 1,746.1* >4,500.0* >4,500.0*    ProBNP (last 3 results) Recent Labs    10/18/24 1439  PROBNP 19,343.0*     D-Dimer No results for input(s): DDIMER in the last 72 hours. Hemoglobin A1C No results for input(s): HGBA1C in the last 72 hours. Fasting Lipid Panel No results for input(s): CHOL, HDL, LDLCALC, TRIG, CHOLHDL, LDLDIRECT in the last 72 hours. Medications:   Scheduled Medications:  amiodarone   200 mg Oral BID   aspirin  EC  81 mg Oral Daily   atorvastatin   10 mg Oral Daily   Chlorhexidine  Gluconate Cloth  6 each Topical Daily   empagliflozin   10 mg Oral  Daily   heparin   5,000 Units Subcutaneous Q8H   hydrALAZINE   25 mg Oral Q8H   potassium chloride   40 mEq Oral Once   sodium chloride  flush  10-40 mL Intracatheter Q12H   sodium chloride  flush  3 mL Intravenous Q12H   sodium chloride  flush  3 mL Intravenous Q12H   spironolactone   12.5 mg Oral Daily    Infusions:  milrinone  0.25 mcg/kg/min (10/25/24 1809)     PRN Medications: acetaminophen , alum & mag hydroxide-simeth, guaiFENesin -dextromethorphan , melatonin, ondansetron  (ZOFRAN ) IV, sodium chloride  flush, sodium chloride  flush  Assessment/Plan   1. Acute on chronic systolic CHF: Biventricular failure, suspect nearing end stage.  NICM.  Cath 9/25 with nonobstructive CAD and  preserved CI.  cMRI 9/25 with LV EF 16%, RV EF 19%, basal septal LGE (nonspecific, seen with dilated CMP).  Echo in 12/25 with EF 18%, severe LV dilation, severe RV dysfunction, severe biatrial enlargement, IVC dilated. CPX with moderate-severe functional impairment due to HF. NYHA class IV on admission with lactic acid of 2.  RHC 10/20/24 on 0.25 milrinone : RA mean 13, PA 54/21 (34), PCWP mean 27, CO/CI (TD) 5.1/2.3.  - Has wide RBBB (QRS > 200 ms), seen by EP => MDT CRT-D device placed.  - Failed milrinone  wean with rise in creatinine and lactate, restarted at 0.25 mcg/min. Inotrope dependent, will plan home milrinone . Now has IR tunneled picc for home. Co-ox 72% today and creatinine down to 2.29.  - torsemide  40 mg bid for home.    - Holding Entresto  with AKI/ongoing elevated creatinine, can restart spironolactone  12.5 daily.  - Continue hydralazine  25 mg tid, had headache with Imdur .  - Can continue Jardiance .  - Continue to hold beta blocker with low output.  - Suspect he has end stage biventricular cardiomyopathy.  Concerned he won't be a very good LVAD candidate with significant RV failure and elevated creatinine.  Heart/kidney transplant would be a consideration but he will have a narrow window for this. He is considering this as an option, if agreeable can refer to [facility].   2. AKI on CKD stage 3b: Creatinine higher at 2.47 => 2.92=>2.7=>2.29 today.  Suspect cardiorenal syndrome, improved back on milrinone . .  - As above, continue milrinone .   3. DM2: SSI.   4. HTN: MAP controlled today, restarting spironolactone . .   5. Iron  deficiency anemia - T sat 17% - Given venofer   6. PVCs/NSVT - May be 2/2 inotrope support - Continue amiodarone  while on milrinone , decreased to 200 mg daily.   Home today with home milrinone .  Will consider transplant evaluation.  Cardiac meds for home: milrinone  0.25, KCl 40 bid, torsemide  40 bid, spironolactone  12.5 daily, hydralazine  25 tid, Jardiance  10 daily,  ASA 81, amiodarone  200 daily.   Length of Stay: 8  Ezra Shuck, MD  10/26/2024, 7:26 AM  Advanced Heart Failure Team Pager [phone] (M-F; 7a - 5p)   Please visit [email or web address]: For overnight coverage please call cardiology fellow first. If fellow not available call Shock/ECMO MD on call.  For ECMO / Mechanical Support (Impella, IABP, LVAD) issues call Shock / ECMO MD on call.

## 2024-10-26 NOTE — Progress Notes (Signed)
 Assessment performed by Donnice Reilly, RN in the presence of this RN due to ongoing issues with employee registration.

## 2024-10-27 ENCOUNTER — Encounter (HOSPITAL_COMMUNITY)

## 2024-10-27 ENCOUNTER — Ambulatory Visit (HOSPITAL_COMMUNITY): Admitting: Cardiology

## 2024-10-29 ENCOUNTER — Ambulatory Visit (HOSPITAL_COMMUNITY)

## 2024-11-01 ENCOUNTER — Encounter (HOSPITAL_COMMUNITY)

## 2024-11-02 ENCOUNTER — Encounter (HOSPITAL_COMMUNITY): Payer: Self-pay

## 2024-11-02 ENCOUNTER — Telehealth (HOSPITAL_COMMUNITY): Payer: Self-pay

## 2024-11-02 DIAGNOSIS — I5022 Chronic systolic (congestive) heart failure: Secondary | ICD-10-CM

## 2024-11-02 NOTE — Telephone Encounter (Signed)
 Patient called and left a message to call. Called patient back and he would like to be discharged and might get a new referral at a later date. He just had an ICD placed and has a lot of appointments coming up.

## 2024-11-02 NOTE — Progress Notes (Addendum)
 Cardiac Individual Treatment Plan  Patient Details  Name: Jonathan Fowler MRN: [id number] Date of Birth: [birth year] Referring Provider:   Flowsheet Row CARDIAC REHAB PHASE II ORIENTATION from 08/02/2024 in [facility] CARDIAC REHABILITATION  Referring Provider Jonathan Shuck MD    Initial Encounter Date:  Flowsheet Row CARDIAC REHAB PHASE II ORIENTATION from 08/02/2024 in [facility] CARDIAC REHABILITATION  Date 08/02/24    Visit Diagnosis: Chronic systolic CHF (congestive heart failure) (HCC)  Patient's Home Medications on Admission: Current Medications[1]  Past Medical History: Past Medical History:  Diagnosis Date   High cholesterol    Hypertension    Vertigo     Tobacco Use: Tobacco Use History[2]  Labs: Review Flowsheet  More data exists      Latest Ref Rng & Units 10/20/2024 10/21/2024 10/22/2024 10/25/2024 10/26/2024  Labs for ITP Cardiac and Pulmonary Rehab  Bicarbonate 20.0 - 28.0 mmol/L 31.6  33.8  - - - -  TCO2 22 - 32 mmol/L 33  35  - - - -  O2 Saturation % 66  69  58.9  69.8  68.9  81.6  65.9  70.9  72     Details       Multiple values from one day are sorted in reverse-chronological order          Exercise Target Goals: Exercise Program Goal: Individual exercise prescription set using results from initial 6 min walk test and THRR while considering  patients activity barriers and safety.   Exercise Prescription Goal: Initial exercise prescription builds to 30-45 minutes a day of aerobic activity, 2-3 days per week.  Home exercise guidelines will be given to patient during program as part of exercise prescription that the participant will acknowledge.   Education: Aerobic Exercise: - Group verbal and visual presentation on the components of exercise prescription. Introduces F.I.T.T principle from ACSM for exercise prescriptions.  Reviews F.I.T.T. principles of aerobic exercise including progression. Written material provided at class  time.   Education: Resistance Exercise: - Group verbal and visual presentation on the components of exercise prescription. Introduces F.I.T.T principle from ACSM for exercise prescriptions  Reviews F.I.T.T. principles of resistance exercise including progression. Written material provided at class time.    Education: Exercise & Equipment Safety: - Individual verbal instruction and demonstration of equipment use and safety with use of the equipment.   Education: Exercise Physiology & General Exercise Guidelines: - Group verbal and written instruction with models to review the exercise physiology of the cardiovascular system and associated critical values. Provides general exercise guidelines with specific guidelines to those with heart or lung disease. Written material provided at class time.   Education: Flexibility, Balance, Mind/Body Relaxation: - Group verbal and visual presentation with interactive activity on the components of exercise prescription. Introduces F.I.T.T principle from ACSM for exercise prescriptions. Reviews F.I.T.T. principles of flexibility and balance exercise training including progression. Also discusses the mind body connection.  Reviews various relaxation techniques to help reduce and manage stress (i.e. Deep breathing, progressive muscle relaxation, and visualization). Balance handout provided to take home. Written material provided at class time.   Activity Barriers & Risk Stratification:   6 Minute Walk:   Oxygen Initial Assessment:   Oxygen Re-Evaluation:   Oxygen Discharge (Final Oxygen Re-Evaluation):   Initial Exercise Prescription:   Perform Capillary Blood Glucose checks as needed.  Exercise Prescription Changes:   Exercise Prescription Changes     Row Name 09/29/24 1500  Response to Exercise   Blood Pressure (Admit) 128/80       Blood Pressure (Exit) 110/70       Heart Rate (Admit) 74 bpm       Heart Rate (Exercise)  86 bpm       Heart Rate (Exit) 74 bpm       Rating of Perceived Exertion (Exercise) 14       Duration Continue with 30 min of aerobic exercise without signs/symptoms of physical distress.       Intensity THRR unchanged         Progression   Progression Continue to progress workloads to maintain intensity without signs/symptoms of physical distress.         Resistance Training   Weight 4       Reps 10-15         Treadmill   MPH 1.5       Grade 0       Minutes 15       METs 2.15         REL-XR   Level 1       Speed 50       Minutes 15       METs 2.7          Exercise Comments:   Exercise Goals and Review:   Exercise Goals Re-Evaluation :  Exercise Goals Re-Evaluation     Row Name 09/22/24 0829             Exercise Goal Re-Evaluation   Exercise Goals Review Increase Physical Activity;Increase Strength and Stamina;Understanding of Exercise Prescription       Comments Jonathan Fowler is doing well in rehab! He is increasing his levels on the treadmill and XR very nicely. He has an active job, so when he is not here 3 days a week, he is active with his job. Did encourage to incorporate some exercise at home on days he is not here.       Expected Outcomes Short: Continue to attend rehab. Long: Incorporate exercise at home.          Discharge Exercise Prescription (Final Exercise Prescription Changes):  Exercise Prescription Changes - 09/29/24 1500       Response to Exercise   Blood Pressure (Admit) 128/80    Blood Pressure (Exit) 110/70    Heart Rate (Admit) 74 bpm    Heart Rate (Exercise) 86 bpm    Heart Rate (Exit) 74 bpm    Rating of Perceived Exertion (Exercise) 14    Duration Continue with 30 min of aerobic exercise without signs/symptoms of physical distress.    Intensity THRR unchanged      Progression   Progression Continue to progress workloads to maintain intensity without signs/symptoms of physical distress.      Resistance Training   Weight 4    Reps  10-15      Treadmill   MPH 1.5    Grade 0    Minutes 15    METs 2.15      REL-XR   Level 1    Speed 50    Minutes 15    METs 2.7          Nutrition:  Target Goals: Understanding of nutrition guidelines, daily intake of sodium 1500mg , cholesterol 200mg , calories 30% from fat and 7% or less from saturated fats, daily to have 5 or more servings of fruits and vegetables.  Education: Nutrition 1 -Group instruction provided by verbal, written material,  interactive activities, discussions, models, and posters to present general guidelines for heart healthy nutrition including macronutrients, label reading, and promoting whole foods over processed counterparts. Education serves as pensions consultant of discussion of heart healthy eating for all. Written material provided at class time.    Education: Nutrition 2 -Group instruction provided by verbal, written material, interactive activities, discussions, models, and posters to present general guidelines for heart healthy nutrition including sodium, cholesterol, and saturated fat. Providing guidance of habit forming to improve blood pressure, cholesterol, and body weight. Written material provided at class time.     Biometrics:    Nutrition Therapy Plan and Nutrition Goals:   Nutrition Assessments:  MEDIFICTS Score Key: >=70 Need to make dietary changes  40-70 Heart Healthy Diet <= 40 Therapeutic Level Cholesterol Diet  Flowsheet Row CARDIAC REHAB PHASE II ORIENTATION from 08/02/2024 in [facility] CARDIAC REHABILITATION  Picture Your Plate Total Score on Admission 75   Picture Your Plate Scores: <59 Unhealthy dietary pattern with much room for improvement. 41-50 Dietary pattern unlikely to meet recommendations for good health and room for improvement. 51-60 More healthful dietary pattern, with some room for improvement.  >60 Healthy dietary pattern, although there may be some specific behaviors that could be improved.     Nutrition Goals Re-Evaluation:  Nutrition Goals Re-Evaluation     Row Name 09/22/24 [phone]             Goals   Nutrition Goal Healthy eating       Comment Jonathan Fowler states his diet is going well. He drinks enough water , sometimes too much he states. He is incorporating more fresh fruits and veggies.       Expected Outcome Short: Continue to attend rehab. Long: Continue to drink adequate amount of water  but not too much.          Nutrition Goals Discharge (Final Nutrition Goals Re-Evaluation):  Nutrition Goals Re-Evaluation - 09/22/24 0834       Goals   Nutrition Goal Healthy eating    Comment Jonathan Fowler states his diet is going well. He drinks enough water , sometimes too much he states. He is incorporating more fresh fruits and veggies.    Expected Outcome Short: Continue to attend rehab. Long: Continue to drink adequate amount of water  but not too much.          Psychosocial: Target Goals: Acknowledge presence or absence of significant depression and/or stress, maximize coping skills, provide positive support system. Participant is able to verbalize types and ability to use techniques and skills needed for reducing stress and depression.   Education: Stress, Anxiety, and Depression - Group verbal and visual presentation to define topics covered.  Reviews how body is impacted by stress, anxiety, and depression.  Also discusses healthy ways to reduce stress and to treat/manage anxiety and depression. Written material provided at class time.   Education: Sleep Hygiene -Provides group verbal and written instruction about how sleep can affect your health.  Define sleep hygiene, discuss sleep cycles and impact of sleep habits. Review good sleep hygiene tips.   Initial Review & Psychosocial Screening:   Quality of Life Scores:   Scores of 19 and below usually indicate a poorer quality of life in these areas.  A difference of  2-3 points is a clinically meaningful difference.  A  difference of 2-3 points in the total score of the Quality of Life Index has been associated with significant improvement in overall quality of life, self-image, physical symptoms, and general health in  studies assessing change in quality of life.  PHQ-9: Review Flowsheet       08/02/2024  Depression screen PHQ 2/9  Decreased Interest 1  Down, Depressed, Hopeless 0  PHQ - 2 Score 1  Altered sleeping 0  Tired, decreased energy 1  Change in appetite 0  Feeling bad or failure about yourself  0  Trouble concentrating 0  Moving slowly or fidgety/restless 0  Suicidal thoughts 0  PHQ-9 Score 2   Difficult doing work/chores Not difficult at all    Details       Data saved with a previous flowsheet row definition        Interpretation of Total Score  Total Score Depression Severity:  1-4 = Minimal depression, 5-9 = Mild depression, 10-14 = Moderate depression, 15-19 = Moderately severe depression, 20-27 = Severe depression   Psychosocial Evaluation and Intervention:   Psychosocial Re-Evaluation:  Psychosocial Re-Evaluation     Row Name 09/22/24 [phone]             Psychosocial Re-Evaluation   Current issues with Current Sleep Concerns       Comments Jonathan Fowler is doing well in rehab! He states there is no current stressors going on at the moment. He is having some sleep issues, he only sleeps about 4 hours a night. He had a sleep study the other day and he may be getting a new CPAP mask, which he feels would help him sleep better.       Expected Outcomes Short; Continue to attend rehab. Long: Follow MD directions regarding sleep study results.       Interventions Encouraged to attend Cardiac Rehabilitation for the exercise       Continue Psychosocial Services  Follow up required by staff          Psychosocial Discharge (Final Psychosocial Re-Evaluation):  Psychosocial Re-Evaluation - 09/22/24 9167       Psychosocial Re-Evaluation   Current issues with Current Sleep Concerns     Comments Jonathan Fowler is doing well in rehab! He states there is no current stressors going on at the moment. He is having some sleep issues, he only sleeps about 4 hours a night. He had a sleep study the other day and he may be getting a new CPAP mask, which he feels would help him sleep better.    Expected Outcomes Short; Continue to attend rehab. Long: Follow MD directions regarding sleep study results.    Interventions Encouraged to attend Cardiac Rehabilitation for the exercise    Continue Psychosocial Services  Follow up required by staff          Vocational Rehabilitation: Provide vocational rehab assistance to qualifying candidates.   Vocational Rehab Evaluation & Intervention:   Education: Education Goals: Education classes will be provided on a variety of topics geared toward better understanding of heart health and risk factor modification. Participant will state understanding/return demonstration of topics presented as noted by education test scores.  Learning Barriers/Preferences:   General Cardiac Education Topics:  AED/CPR: - Group verbal and written instruction with the use of models to demonstrate the basic use of the AED with the basic ABC's of resuscitation.   Test and Procedures: - Group verbal and visual presentation and models provide information about basic cardiac anatomy and function. Reviews the testing methods done to diagnose heart disease and the outcomes of the test results. Describes the treatment choices: Medical Management, Angioplasty, or Coronary Bypass Surgery for treating various heart conditions including Myocardial Infarction, Angina,  Valve Disease, and Cardiac Arrhythmias. Written material provided at class time.   Medication Safety: - Group verbal and visual instruction to review commonly prescribed medications for heart and lung disease. Reviews the medication, class of the drug, and side effects. Includes the steps to properly store meds and  maintain the prescription regimen. Written material provided at class time. Flowsheet Row CARDIAC REHAB PHASE II EXERCISE from 09/29/2024 in [facility] CARDIAC REHABILITATION  Date 09/15/24  Educator bs  Instruction Review Code 1- Verbalizes Understanding    Intimacy: - Group verbal instruction through game format to discuss how heart and lung disease can affect sexual intimacy. Written material provided at class time.   Know Your Numbers and Heart Failure: - Group verbal and visual instruction to discuss disease risk factors for cardiac and pulmonary disease and treatment options.  Reviews associated critical values for Overweight/Obesity, Hypertension, Cholesterol, and Diabetes.  Discusses basics of heart failure: signs/symptoms and treatments.  Introduces Heart Failure Zone chart for action plan for heart failure. Written material provided at class time.   Infection Prevention: - Provides verbal and written material to individual with discussion of infection control including proper hand washing and proper equipment cleaning during exercise session.   Falls Prevention: - Provides verbal and written material to individual with discussion of falls prevention and safety.   Other: -Provides group and verbal instruction on various topics (see comments)   Knowledge Questionnaire Score:   Core Components/Risk Factors/Patient Goals at Admission:   Education:Diabetes - Individual verbal and written instruction to review signs/symptoms of diabetes, desired ranges of glucose level fasting, after meals and with exercise. Acknowledge that pre and post exercise glucose checks will be done for 3 sessions at entry of program.   Core Components/Risk Factors/Patient Goals Review:   Goals and Risk Factor Review     Row Name 09/22/24 0836             Core Components/Risk Factors/Patient Goals Review   Personal Goals Review Improve shortness of breath with ADL's;Hypertension;Heart  Failure;Lipids       Review Jonathan Fowler is doing well in rehab! Sometimes he has to miss due to work, but he is now coming 3 days a week! He states he checks his BP and weight every morning and has a log to document them in. He also takes all his medicines as prescribed.       Expected Outcomes Short: Continue to attend rehab. Long: Continue checking weight and vitals at home and report any abnormalities to MD.          Core Components/Risk Factors/Patient Goals at Discharge (Final Review):   Goals and Risk Factor Review - 09/22/24 0836       Core Components/Risk Factors/Patient Goals Review   Personal Goals Review Improve shortness of breath with ADL's;Hypertension;Heart Failure;Lipids    Review Jonathan Fowler is doing well in rehab! Sometimes he has to miss due to work, but he is now coming 3 days a week! He states he checks his BP and weight every morning and has a log to document them in. He also takes all his medicines as prescribed.    Expected Outcomes Short: Continue to attend rehab. Long: Continue checking weight and vitals at home and report any abnormalities to MD.          ITP Comments:  ITP Comments     Row Name 09/14/24 0833 10/12/24 1027 10/13/24 0942       ITP Comments 30 day review completed. ITP sent to Dr.  Dorn Ross, Medical Director of Cardiac Rehab. Continue with ITP unless changes are made by physician. Unable to get goals this round due to lack of attendance and admission.  He last attended on 08/25/24. 30 day review completed. ITP sent to Dr. Dorn Ross, Medical Director of Cardiac Rehab. Continue with ITP unless changes are made by physician. Attendance has been intermitten due to medication adjustments. Called patient regarding missed cardiac rehab sessions. He continues to have lower extremity edema and wants to cancel his sessions until he sees Dr. Zenaida 10/28/23. He says he will contact us  after his visit with Dr. Zenaida.        Comments: Discharge ITP      [1]  Current Outpatient Medications:    amiodarone  (PACERONE ) 200 MG tablet, Take 1 tablet (200 mg total) by mouth daily., Disp: 90 tablet, Rfl: 3   aspirin  EC 81 MG tablet, Take 1 tablet (81 mg total) by mouth daily. Swallow whole., Disp: 30 tablet, Rfl: 12   atorvastatin  (LIPITOR) 10 MG tablet, Take 1 tablet (10 mg total) by mouth daily., Disp: 90 tablet, Rfl: 3   Cholecalciferol (VITAMIN D3) 50 MCG (2000 UT) capsule, Take 2,000 Units by mouth daily., Disp: , Rfl:    empagliflozin  (JARDIANCE ) 10 MG TABS tablet, Take 1 tablet (10 mg total) by mouth daily., Disp: 30 tablet, Rfl: 11   glucosamine-chondroitin 500-400 MG tablet, Take 1 tablet by mouth in the morning and at bedtime., Disp: , Rfl:    hydrALAZINE  (APRESOLINE ) 25 MG tablet, Take 1 tablet (25 mg total) by mouth every 8 (eight) hours., Disp: 90 tablet, Rfl: 11   metFORMIN (GLUCOPHAGE) 500 MG tablet, Take 500 mg by mouth in the morning and at bedtime., Disp: , Rfl:    milrinone  (PRIMACOR ) 20 MG/100 ML SOLN infusion, Inject 0.0247 mg/min into the vein continuous. Dosing weight 98 kg, Disp: , Rfl:    Multiple Vitamins-Minerals (CENTRUM SILVER 50+MEN) TABS, Take 1 tablet by mouth daily., Disp: , Rfl:    potassium chloride  SA (KLOR-CON  M) 20 MEQ tablet, Take 3 tablets (60 mEq total) by mouth once., Disp: 90 tablet, Rfl: 5   spironolactone  (ALDACTONE ) 25 MG tablet, Take 0.5 tablets (12.5 mg total) by mouth daily., Disp: 15 tablet, Rfl: 11   torsemide  (DEMADEX ) 20 MG tablet, Take 2 tablets (40 mg total) by mouth 2 (two) times daily. Weigh yourself daily - if you gain three pounds overnight or five pounds in a week, take an extra tablet., Disp: 140 tablet, Rfl: 11 [2]  Social History Tobacco Use  Smoking Status Never  Smokeless Tobacco Never

## 2024-11-02 NOTE — Progress Notes (Addendum)
 Discharge Progress Report  Patient Details  Name: Jonathan Fowler MRN: [id number] Date of Birth: 1956/03/24 Referring Provider:   Flowsheet Row CARDIAC REHAB PHASE II ORIENTATION from 08/02/2024 in [facility] CARDIAC REHABILITATION  Referring Provider Ezra Shuck MD     Number of Visits: 11  Reason for Discharge:  Early Exit:  Personal and ICD placed ask to be discharged   Smoking History:  Tobacco Use History[1]  Diagnosis:  Chronic systolic CHF (congestive heart failure) (HCC)  ADL [facility]:   Initial Exercise Prescription:   Discharge Exercise Prescription (Final Exercise Prescription Changes):  Exercise Prescription Changes - 09/29/24 1500       Response to Exercise   Blood Pressure (Admit) 128/80    Blood Pressure (Exit) 110/70    Heart Rate (Admit) 74 bpm    Heart Rate (Exercise) 86 bpm    Heart Rate (Exit) 74 bpm    Rating of Perceived Exertion (Exercise) 14    Duration Continue with 30 min of aerobic exercise without signs/symptoms of physical distress.    Intensity THRR unchanged      Progression   Progression Continue to progress workloads to maintain intensity without signs/symptoms of physical distress.      Resistance Training   Weight 4    Reps 10-15      Treadmill   MPH 1.5    Grade 0    Minutes 15    METs 2.15      REL-XR   Level 1    Speed 50    Minutes 15    METs 2.7          Functional Capacity:   Psychological, QOL, Others - Outcomes: PHQ 2/9:    08/02/2024    8:21 AM  Depression screen PHQ 2/9  Decreased Interest 1  Down, Depressed, Hopeless 0  PHQ - 2 Score 1  Altered sleeping 0  Tired, decreased energy 1  Change in appetite 0  Feeling bad or failure about yourself  0  Trouble concentrating 0  Moving slowly or fidgety/restless 0  Suicidal thoughts 0  PHQ-9 Score 2   Difficult doing work/chores Not difficult at all     Data saved with a previous flowsheet row definition    Quality of Life:   Personal  Goals: Goals established at orientation with interventions provided to work toward goal.    Personal Goals Discharge:  Goals and Risk Factor Review     Row Name 09/22/24 [phone]             Core Components/Risk Factors/Patient Goals Review   Personal Goals Review Improve shortness of breath with ADL's;Hypertension;Heart Failure;Lipids       Review Jonathan Fowler is doing well in rehab! Sometimes he has to miss due to work, but he is now coming 3 days a week! He states he checks his BP and weight every morning and has a log to document them in. He also takes all his medicines as prescribed.       Expected Outcomes Short: Continue to attend rehab. Long: Continue checking weight and vitals at home and report any abnormalities to MD.          Exercise Goals and Review:   Exercise Goals Re-Evaluation:  Exercise Goals Re-Evaluation     Row Name 09/22/24 0829             Exercise Goal Re-Evaluation   Exercise Goals Review Increase Physical Activity;Increase Strength and Stamina;Understanding of Exercise Prescription  Comments Jonathan Fowler is doing well in rehab! He is increasing his levels on the treadmill and XR very nicely. He has an active job, so when he is not here 3 days a week, he is active with his job. Did encourage to incorporate some exercise at home on days he is not here.       Expected Outcomes Short: Continue to attend rehab. Long: Incorporate exercise at home.          Nutrition & Weight - Outcomes:    Nutrition:   Nutrition Discharge:   Education Questionnaire Score:   Goals reviewed with patient; copy given to patient.     [1]  Social History Tobacco Use  Smoking Status Never  Smokeless Tobacco Never

## 2024-11-03 ENCOUNTER — Encounter (HOSPITAL_COMMUNITY)

## 2024-11-04 ENCOUNTER — Ambulatory Visit: Attending: Cardiology

## 2024-11-04 ENCOUNTER — Telehealth (HOSPITAL_COMMUNITY): Payer: Self-pay

## 2024-11-04 DIAGNOSIS — I5023 Acute on chronic systolic (congestive) heart failure: Secondary | ICD-10-CM

## 2024-11-04 NOTE — Patient Instructions (Addendum)
" ° °  After Your ICD (Implantable Cardiac Defibrillator)    Monitor your defibrillator site for redness, swelling, and drainage. Call the device clinic at [phone] if you experience these symptoms or fever/chills.  Your incision was closed with Steri-strips or staples:  You may shower 7 days after your procedure and wash your incision with soap and water . Avoid lotions, ointments, or perfumes over your incision until it is well-healed.  You may use a hot tub or a pool after your wound check appointment if the incision is completely closed.  Do not lift, push or pull greater than 10 pounds with the affected arm until 6 weeks after your procedure December 04, 2024. There are no other restrictions in arm movement after your wound check appointment.  Your ICD is designed to protect you from life threatening heart rhythms. Because of this, you may receive a shock.   1 shock with no symptoms:  Call the office during business hours. 1 shock with symptoms (chest pain, chest pressure, dizziness, lightheadedness, shortness of breath, overall feeling unwell):  Call 911. If you experience 2 or more shocks in 24 hours:  Call 911. If you receive a shock, you should not drive.  [place] DMV - no driving for 6 months if you receive appropriate therapy from your ICD.   ICD Alerts:  Some alerts are vibratory and others beep. These are NOT emergencies. Please call our office to let us  know. If this occurs at night or on weekends, it can wait until the next business day. Send a remote transmission.  If your device is capable of reading fluid status (for heart failure), you will be offered monthly monitoring to review this with you.   Remote monitoring is used to monitor your ICD from home. This monitoring is scheduled every 91 days by our office. It allows us  to keep an eye on the functioning of your device to ensure it is working properly. You will routinely see your Electrophysiologist annually (more often if  necessary).  "

## 2024-11-04 NOTE — Progress Notes (Signed)
 Normal CRT-D chamber ICD wound check. Wound well healed. Presenting rhythm: AS/VS 83. Routine testing performed. Thresholds, sensing, and impedance consistent with implant measurements with 3.5V safety margin/auto capture until 3 month visit. No treated arrhythmias. Reviewed arm restrictions to continue for 6 weeks total post op. Reviewed shock plan.  Pt enrolled in remote follow-up.  1 false VT event logged. Appears noise on RA lead. Unable to reproduce with isometrics or pocket manipulation. During date and of noise, patient was in procedure in hospital for central line placement.

## 2024-11-04 NOTE — Telephone Encounter (Signed)
 Called to confirm/remind patient of their appointment at the [facility] on 11/04/24 8:30.   Appointment:   [x] Confirmed  [] Left mess   [] No answer/No voice mail  [] VM Full/unable to leave message  [] Phone not in service  Patient reminded to bring all medications and/or complete list.  Confirmed patient has transportation. Gave directions, instructed to utilize valet parking.

## 2024-11-05 ENCOUNTER — Ambulatory Visit (HOSPITAL_COMMUNITY)
Admission: RE | Admit: 2024-11-05 | Discharge: 2024-11-05 | Disposition: A | Source: Ambulatory Visit | Attending: Cardiology

## 2024-11-05 ENCOUNTER — Ambulatory Visit (HOSPITAL_COMMUNITY): Payer: Self-pay | Admitting: Cardiology

## 2024-11-05 ENCOUNTER — Telehealth (HOSPITAL_COMMUNITY): Payer: Self-pay

## 2024-11-05 ENCOUNTER — Encounter (HOSPITAL_COMMUNITY): Payer: Self-pay

## 2024-11-05 ENCOUNTER — Ambulatory Visit: Payer: Self-pay | Admitting: Cardiology

## 2024-11-05 ENCOUNTER — Ambulatory Visit (HOSPITAL_COMMUNITY)

## 2024-11-05 VITALS — BP 154/82 | HR 87 | Wt 199.8 lb

## 2024-11-05 DIAGNOSIS — I428 Other cardiomyopathies: Secondary | ICD-10-CM | POA: Insufficient documentation

## 2024-11-05 DIAGNOSIS — I493 Ventricular premature depolarization: Secondary | ICD-10-CM | POA: Diagnosis not present

## 2024-11-05 DIAGNOSIS — I5022 Chronic systolic (congestive) heart failure: Secondary | ICD-10-CM | POA: Diagnosis present

## 2024-11-05 DIAGNOSIS — N1832 Chronic kidney disease, stage 3b: Secondary | ICD-10-CM | POA: Diagnosis not present

## 2024-11-05 DIAGNOSIS — E1122 Type 2 diabetes mellitus with diabetic chronic kidney disease: Secondary | ICD-10-CM | POA: Diagnosis not present

## 2024-11-05 DIAGNOSIS — I2510 Atherosclerotic heart disease of native coronary artery without angina pectoris: Secondary | ICD-10-CM | POA: Diagnosis not present

## 2024-11-05 DIAGNOSIS — I5082 Biventricular heart failure: Secondary | ICD-10-CM | POA: Insufficient documentation

## 2024-11-05 DIAGNOSIS — Z7984 Long term (current) use of oral hypoglycemic drugs: Secondary | ICD-10-CM | POA: Diagnosis not present

## 2024-11-05 DIAGNOSIS — Z79899 Other long term (current) drug therapy: Secondary | ICD-10-CM | POA: Insufficient documentation

## 2024-11-05 DIAGNOSIS — I5084 End stage heart failure: Secondary | ICD-10-CM | POA: Insufficient documentation

## 2024-11-05 DIAGNOSIS — I130 Hypertensive heart and chronic kidney disease with heart failure and stage 1 through stage 4 chronic kidney disease, or unspecified chronic kidney disease: Secondary | ICD-10-CM | POA: Insufficient documentation

## 2024-11-05 DIAGNOSIS — Z7982 Long term (current) use of aspirin: Secondary | ICD-10-CM | POA: Insufficient documentation

## 2024-11-05 LAB — CUP PACEART INCLINIC DEVICE CHECK
Date Time Interrogation Session: 20260122163050
Implantable Lead Connection Status: 753985
Implantable Lead Connection Status: 753985
Implantable Lead Connection Status: 753985
Implantable Lead Implant Date: 20260109
Implantable Lead Implant Date: 20260109
Implantable Lead Implant Date: 20260109
Implantable Lead Location: 753858
Implantable Lead Location: 753859
Implantable Lead Location: 753860
Implantable Lead Model: 4598
Implantable Lead Model: 5076
Implantable Pulse Generator Implant Date: 20260109

## 2024-11-05 LAB — BASIC METABOLIC PANEL WITH GFR
Anion gap: 15 (ref 5–15)
BUN: 47 mg/dL — ABNORMAL HIGH (ref 8–23)
CO2: 28 mmol/L (ref 22–32)
Calcium: 10 mg/dL (ref 8.9–10.3)
Chloride: 97 mmol/L — ABNORMAL LOW (ref 98–111)
Creatinine, Ser: 2.3 mg/dL — ABNORMAL HIGH (ref 0.61–1.24)
GFR, Estimated: 30 mL/min — ABNORMAL LOW
Glucose, Bld: 80 mg/dL (ref 70–99)
Potassium: 3.8 mmol/L (ref 3.5–5.1)
Sodium: 140 mmol/L (ref 135–145)

## 2024-11-05 MED ORDER — HYDRALAZINE HCL 25 MG PO TABS: 25.0000 mg | ORAL_TABLET | Freq: Three times a day (TID) | ORAL | 11 refills | Status: DC

## 2024-11-05 MED ORDER — ATORVASTATIN CALCIUM 10 MG PO TABS: 10.0000 mg | ORAL_TABLET | Freq: Every day | ORAL | 3 refills | Status: AC

## 2024-11-05 MED ORDER — EMPAGLIFLOZIN 10 MG PO TABS: 10.0000 mg | ORAL_TABLET | Freq: Every day | ORAL | 11 refills | Status: AC

## 2024-11-05 MED ORDER — POTASSIUM CHLORIDE CRYS ER 20 MEQ PO TBCR: 60.0000 meq | EXTENDED_RELEASE_TABLET | Freq: Once | ORAL | 5 refills | Status: DC

## 2024-11-05 MED ORDER — SPIRONOLACTONE 25 MG PO TABS: 12.5000 mg | ORAL_TABLET | Freq: Every day | ORAL | 11 refills | Status: AC

## 2024-11-05 MED ORDER — TORSEMIDE 20 MG PO TABS: 40.0000 mg | ORAL_TABLET | Freq: Two times a day (BID) | ORAL | 11 refills | Status: DC

## 2024-11-05 MED ORDER — AMIODARONE HCL 200 MG PO TABS: 200.0000 mg | ORAL_TABLET | Freq: Every day | ORAL | 3 refills | Status: AC

## 2024-11-05 NOTE — Progress Notes (Signed)
 "  [facility] Note   Referring Physician: PCP: Regino Slater, MD PCP-Cardiologist: Dr. Zenaida   Chief Complaint: Hospital f/u for systolic heart failure, inotrope dependent  HPI:  Jonathan Fowler is a 69 y.o. male with history of acute on chronic biventricular heart failure, HTN and CKD III.   He was first diagnosed with heart failure during admission in 09/25. Echo showed EF <20%, AHF consulted.  RHC 07/02/2024 with severely elevated biventricular filling pressures and reduced cardiac index. cMRI with NiCM BiV HF, LVEF 15%. As renal function improved, underwent [facility] 07/05/24 with nonobs CAD, elevated filling pressures and preserved CO. Required further diuresis and GDMT titrated. He was discharged home, weight 213 lbs.    Repeat echo 12/25 again showed EF <20%, biplan 18.4%, G3DD, severely reduced RV function, BAE.     He was seen in AHF clinic 10/19/23 with volume overload and NYHA IV symptoms. He was sent to the ED and admitted for inotrope assisted diuresis. RHC on 0.25 milrinone  with elevated filling pressures and preserved cardiac output. Once diuresed and GDMT optimized, milrinone  was attempted to be weaned. Seen by EP given very wide QRS and underwent CRT-D implant.    Unfortunately he failed milrinone  wean with lactic acidosis and rise in SCr. Milrinone  restarted and decision made to proceed with home milrinone . Symptomatically felt better on milrinone . Tunneled PICC placed. It was noted that he would need to be considered for possible heart/kidney transplant. Felt to be poor VAD candidate given significant RV failure and elevated SCr.   He presents today for post hospital f/u. Here w/ his wife. Feeling much better w/ milrinone  w/ improved functional status, now NYHA Class II. More active at home. Wt down 6 lb since d/c. No LEE. Tolerating milrinone  and all other meds w/o side effects. Denies palpitations. Had wound check in EP clinic yesterday, felt to be doing ok.    SBPs at home low 130s systolic.    Review of Systems: [y] = yes, [ ]  = no   General: Weight gain [ ] ; Weight loss [ ] ; Anorexia [ ] ; Fatigue [ ] ; Fever [ ] ; Chills [ ] ; Weakness [ ]   Cardiac: Chest pain/pressure [ ] ; Resting SOB [ ] ; Exertional SOB [ ] ; Orthopnea [ ] ; Pedal Edema [ ] ; Palpitations [ ] ; Syncope [ ] ; Presyncope [ ] ; Paroxysmal nocturnal dyspnea[ ]   Pulmonary: Cough [ ] ; Wheezing[ ] ; Hemoptysis[ ] ; Sputum [ ] ; Snoring [ ]   GI: Vomiting[ ] ; Dysphagia[ ] ; Melena[ ] ; Hematochezia [ ] ; Heartburn[ ] ; Abdominal pain [ ] ; Constipation [ ] ; Diarrhea [ ] ; BRBPR [ ]   GU: Hematuria[ ] ; Dysuria [ ] ; Nocturia[ ]   Vascular: Pain in legs with walking [ ] ; Pain in feet with lying flat [ ] ; Non-healing sores [ ] ; Stroke [ ] ; TIA [ ] ; Slurred speech [ ] ;  Neuro: Headaches[ ] ; Vertigo[ ] ; Seizures[ ] ; Paresthesias[ ] ;Blurred vision [ ] ; Diplopia [ ] ; Vision changes [ ]   Ortho/Skin: Arthritis [ ] ; Joint pain [ ] ; Muscle pain [ ] ; Joint swelling [ ] ; Back Pain [ ] ; Rash [ ]   Psych: Depression[ ] ; Anxiety[ ]   Heme: Bleeding problems [ ] ; Clotting disorders [ ] ; Anemia [ ]   Endocrine: Diabetes [ ] ; Thyroid  dysfunction[ ]    Past Medical History:  Diagnosis Date   High cholesterol    Hypertension    Vertigo     Current Outpatient Medications  Medication Sig Dispense Refill   amiodarone  (PACERONE ) 200 MG tablet Take 1  tablet (200 mg total) by mouth daily. 90 tablet 3   aspirin  EC 81 MG tablet Take 1 tablet (81 mg total) by mouth daily. Swallow whole. 30 tablet 12   atorvastatin  (LIPITOR) 10 MG tablet Take 1 tablet (10 mg total) by mouth daily. 90 tablet 3   Cholecalciferol (VITAMIN D3) 50 MCG (2000 UT) capsule Take 2,000 Units by mouth daily.     empagliflozin  (JARDIANCE ) 10 MG TABS tablet Take 1 tablet (10 mg total) by mouth daily. 30 tablet 11   glucosamine-chondroitin 500-400 MG tablet Take 1 tablet by mouth in the morning and at bedtime.     hydrALAZINE  (APRESOLINE ) 25 MG tablet Take 1  tablet (25 mg total) by mouth every 8 (eight) hours. 90 tablet 11   metFORMIN (GLUCOPHAGE) 500 MG tablet Take 500 mg by mouth in the morning and at bedtime.     milrinone  (PRIMACOR ) 20 MG/100 ML SOLN infusion Inject 0.0247 mg/min into the vein continuous. Dosing weight 98 kg     Multiple Vitamins-Minerals (CENTRUM SILVER 50+MEN) TABS Take 1 tablet by mouth daily.     potassium chloride  SA (KLOR-CON  M) 20 MEQ tablet Take 3 tablets (60 mEq total) by mouth once. 90 tablet 5   spironolactone  (ALDACTONE ) 25 MG tablet Take 0.5 tablets (12.5 mg total) by mouth daily. 15 tablet 11   torsemide  (DEMADEX ) 20 MG tablet Take 2 tablets (40 mg total) by mouth 2 (two) times daily. Weigh yourself daily - if you gain three pounds overnight or five pounds in a week, take an extra tablet. 140 tablet 11   No current facility-administered medications for this encounter.    Allergies[1]    Social History   Socioeconomic History   Marital status: Married    Spouse name: Not on file   Number of children: Not on file   Years of education: Not on file   Highest education level: Not on file  Occupational History   Not on file  Tobacco Use   Smoking status: Never   Smokeless tobacco: Never  Substance and Sexual Activity   Alcohol use: Not Currently    Comment: socially   Drug use: No   Sexual activity: Not on file  Other Topics Concern   Not on file  Social History Narrative   Not on file   Social Drivers of Health   Tobacco Use: Low Risk (11/05/2024)   Patient History    Smoking Tobacco Use: Never    Smokeless Tobacco Use: Never    Passive Exposure: Not on file  Financial Resource Strain: Not on file  Food Insecurity: No Food Insecurity (10/19/2024)   Epic    Worried About Programme Researcher, Broadcasting/film/video in the Last Year: Never true    Ran Out of Food in the Last Year: Never true  Transportation Needs: No Transportation Needs (10/19/2024)   Epic    Lack of Transportation (Medical): No    Lack of  Transportation (Non-Medical): No  Physical Activity: Not on file  Stress: Not on file  Social Connections: Moderately Isolated (10/19/2024)   Social Connection and Isolation Panel    Frequency of Communication with Friends and Family: More than three times a week    Frequency of Social Gatherings with Friends and Family: Never    Attends Religious Services: Never    Database Administrator or Organizations: No    Attends Banker Meetings: Never    Marital Status: Married  Catering Manager Violence: Not At Risk (10/19/2024)  Epic    Fear of Current or Ex-Partner: No    Emotionally Abused: No    Physically Abused: No    Sexually Abused: No  Depression (PHQ2-9): Low Risk (08/02/2024)   Depression (PHQ2-9)    PHQ-2 Score: 2  Alcohol Screen: Not on file  Housing: Low Risk (10/19/2024)   Epic    Unable to Pay for Housing in the Last Year: No    Number of Times Moved in the Last Year: 0    Homeless in the Last Year: No  Utilities: Not At Risk (10/19/2024)   Epic    Threatened with loss of utilities: No  Health Literacy: Not on file     History reviewed. No pertinent family history.  Vitals:   11/05/24 0823  BP: (!) 154/82  Pulse: 87  SpO2: 97%  Weight: 90.6 kg (199 lb 12.8 oz)     PHYSICAL EXAM: General:  Well appearing. No respiratory difficulty HEENT: normal Neck: supple. no JVD 6 cm  Cor: PMI nondisplaced. Regular rate & rhythm. No rubs, gallops or murmurs. Lungs: clear Abdomen: soft, nontender, nondistended. No hepatosplenomegaly. No bruits or masses. Good bowel sounds. Extremities: no cyanosis, clubbing, rash, edema Neuro: alert & oriented x 3, cranial nerves grossly intact. moves all 4 extremities w/o difficulty. Affect pleasant.  ECG: not performed    ASSESSMENT & PLAN:  1. Stage D Chronic Systolic CHF: Biventricular failure. NICM. Cath 9/25 with nonobstructive CAD and preserved CI.  cMRI 9/25 with LV EF 16%, RV EF 19%, basal septal LGE (nonspecific,  seen with dilated CMP).  Echo in 12/25 with EF 18%, severe LV dilation, severe RV dysfunction, severe biatrial enlargement, IVC dilated. CPX with moderate-severe functional impairment due to HF. Recent admit w/ NYHA Class IV symptoms and low output, initial acid of 2.  RHC 10/20/24 on 0.25 milrinone : RA mean 13, PA 54/21 (34), PCWP mean 27, CO/CI (TD) 5.1/2.3. Given wide LBBB (QRS > 200 ms) MDT CRT-D device placed. Failed milrinone  wean with rise in creatinine and lactate, deemed inotrope dependent, now on home milrinone  0.25 mcg/kg/min.  - Today, he reports feeling great. Improved functional status, now NYHA II w/ milrinone . Euvolemic on exam  - Continue torsemide  40 mg bid. Check BMP today   - Continue Jardiance  10 mg daily  - Off Entresto  w/ elevated SCr - Continue Spironolactone  12.5 mg daily  - Continue hydralazine  25 mg tid given home SBPs low 130s and need for renal perfusion. If SCr ok, can carefully trial increase next visit  - Off Imdur  d/t HA  - Off ? blocker w/ low output and inotrope requirement  - Suspect he has end stage biventricular cardiomyopathy.  Concerned he won't be a very good LVAD candidate with significant RV failure and elevated creatinine.  Heart/kidney transplant would be a consideration but he will have a narrow window for this. He is still unsure if he would want this but he is open to considering this as an option and would like to discuss more w/ transplant team. Will refer to [facility].     2. CKD stage 3b: Suspect cardiorenal syndrome, now on home milrinone   - most recent SCr 2.0-2.7 - check BMP today    3. DM2: controlled on current regimen, Hgb A1c 6.0  - continue Metformin and Jardiance    4. HTN: controlled on current regimen - GDMT per above   5. PVCs/NSVT - May be 2/2 inotrope support - He denies palpitations, now w/ CRT-D  - Continue amiodarone  while  on milrinone , 200 mg daily.    F/u w/ Dr. Zenaida in 2-3 wks.    Caffie Shed, PA-C 11/05/24       [1]  Allergies Allergen Reactions   Loratadine     Headache    Lisinopril Cough   Mobic [Meloxicam] Nausea And Vomiting   Penicillins Nausea Only and Rash    Has patient had a PCN reaction causing immediate rash, facial/tongue/throat swelling, SOB or lightheadedness with hypotension: No Has patient had a PCN reaction causing severe rash involving mucus membranes or skin necrosis: No Has patient had a PCN reaction that required hospitalization: No Has patient had a PCN reaction occurring within the last 10 years: No If all of the above answers are NO, then may proceed with Cephalosporin use.   "

## 2024-11-05 NOTE — Patient Instructions (Signed)
 Medication Changes:  No Changes In Medications at this time.   Lab Work:  Labs done today, your results will be available in MyChart, we will contact you for abnormal readings.  REFERRAL WILL BE SENT TO [facility] FOR HEART AND KIDNEY TRANSPLANT EVALUATION   Follow-Up in: as scheduled with Dr. Zenaida   At the [facility], you and your health needs are our priority. We have a designated team specialized in the treatment of Heart Failure. This Care Team includes your primary Heart Failure Specialized Cardiologist (physician), Advanced Practice Providers (APPs- Physician Assistants and Nurse Practitioners), and Pharmacist who all work together to provide you with the care you need, when you need it.   You may see any of the following providers on your designated Care Team at your next follow up:  Dr. Toribio Fuel Dr. Ezra Shuck Dr. Odis Zenaida Greig Mosses, NP Caffie Shed, [place] [facility] [place], [place] Beckey Coe, NP Jordan Lee, NP Tinnie Redman, PharmD   Please be sure to bring in all your medications bottles to every appointment.   Need to Contact Us :  If you have any questions or concerns before your next appointment please send us  a message through [employer] or call our office at [phone].    TO LEAVE A MESSAGE FOR THE NURSE SELECT OPTION 2, PLEASE LEAVE A MESSAGE INCLUDING: YOUR NAME DATE OF BIRTH CALL BACK NUMBER REASON FOR CALL**this is important as we prioritize the call backs  YOU WILL RECEIVE A CALL BACK THE SAME DAY AS LONG AS YOU CALL BEFORE 4:00 PM

## 2024-11-05 NOTE — Telephone Encounter (Signed)
 Referral and all information printed and faxed/scanned to [facility] for transplant evaluation.

## 2024-11-08 ENCOUNTER — Encounter (HOSPITAL_COMMUNITY)

## 2024-11-09 ENCOUNTER — Encounter (HOSPITAL_COMMUNITY): Payer: Self-pay | Admitting: *Deleted

## 2024-11-09 NOTE — Progress Notes (Signed)
 Cardiac Individual Treatment Plan  Patient Details  Name: Jonathan Fowler MRN: [id number] Date of Birth: [birth year] Referring Provider:   Flowsheet Row CARDIAC REHAB PHASE II ORIENTATION from 08/02/2024 in [facility] CARDIAC REHABILITATION  Referring Provider Ezra Shuck MD    Initial Encounter Date:  Flowsheet Row CARDIAC REHAB PHASE II ORIENTATION from 08/02/2024 in [place] [facility] CARDIAC REHABILITATION  Date 08/02/24    Visit Diagnosis: No diagnosis found.  Patient's Home Medications on Admission: Current Medications[1]  Past Medical History: Past Medical History:  Diagnosis Date   High cholesterol    Hypertension    Vertigo     Tobacco Use: Tobacco Use History[2]  Labs: Review Flowsheet  More data exists      Latest Ref Rng & Units 10/20/2024 10/21/2024 10/22/2024 10/25/2024 10/26/2024  Labs for ITP Cardiac and Pulmonary Rehab  Bicarbonate 20.0 - 28.0 mmol/L 31.6  33.8  - - - -  TCO2 22 - 32 mmol/L 33  35  - - - -  O2 Saturation % 66  69  58.9  69.8  68.9  81.6  65.9  70.9  72     Details       Multiple values from one day are sorted in reverse-chronological order         Capillary Blood Glucose: Lab Results  Component Value Date   GLUCAP 106 (H) 07/03/2024   GLUCAP 120 (H) 07/02/2024   GLUCAP 105 (H) 07/02/2024   GLUCAP 147 (H) 07/02/2024   GLUCAP 92 07/02/2024     Exercise Target Goals: Exercise Program Goal: Individual exercise prescription set using results from initial 6 min walk test and THRR while considering  patients activity barriers and safety.   Exercise Prescription Goal: Starting with aerobic activity 30 plus minutes a day, 3 days per week for initial exercise prescription. Provide home exercise prescription and guidelines that participant acknowledges understanding prior to discharge.  Activity Barriers & Risk Stratification:   6 Minute Walk:   Oxygen Initial Assessment:   Oxygen Re-Evaluation:   Oxygen Discharge (Final  Oxygen Re-Evaluation):   Initial Exercise Prescription:   Perform Capillary Blood Glucose checks as needed.  Exercise Prescription Changes:   Exercise Prescription Changes     Row Name 09/29/24 1500             Response to Exercise   Blood Pressure (Admit) 128/80       Blood Pressure (Exit) 110/70       Heart Rate (Admit) 74 bpm       Heart Rate (Exercise) 86 bpm       Heart Rate (Exit) 74 bpm       Rating of Perceived Exertion (Exercise) 14       Duration Continue with 30 min of aerobic exercise without signs/symptoms of physical distress.       Intensity THRR unchanged         Progression   Progression Continue to progress workloads to maintain intensity without signs/symptoms of physical distress.         Resistance Training   Weight 4       Reps 10-15         Treadmill   MPH 1.5       Grade 0       Minutes 15       METs 2.15         REL-XR   Level 1       Speed 50  Minutes 15       METs 2.7          Exercise Comments:   Exercise Goals and Review:   Exercise Goals Re-Evaluation :  Exercise Goals Re-Evaluation     Row Name 09/22/24 0829             Exercise Goal Re-Evaluation   Exercise Goals Review Increase Physical Activity;Increase Strength and Stamina;Understanding of Exercise Prescription       Comments Jonathan Fowler is doing well in rehab! He is increasing his levels on the treadmill and XR very nicely. He has an active job, so when he is not here 3 days a week, he is active with his job. Did encourage to incorporate some exercise at home on days he is not here.       Expected Outcomes Short: Continue to attend rehab. Long: Incorporate exercise at home.           Discharge Exercise Prescription (Final Exercise Prescription Changes):  Exercise Prescription Changes - 09/29/24 1500       Response to Exercise   Blood Pressure (Admit) 128/80    Blood Pressure (Exit) 110/70    Heart Rate (Admit) 74 bpm    Heart Rate (Exercise) 86 bpm     Heart Rate (Exit) 74 bpm    Rating of Perceived Exertion (Exercise) 14    Duration Continue with 30 min of aerobic exercise without signs/symptoms of physical distress.    Intensity THRR unchanged      Progression   Progression Continue to progress workloads to maintain intensity without signs/symptoms of physical distress.      Resistance Training   Weight 4    Reps 10-15      Treadmill   MPH 1.5    Grade 0    Minutes 15    METs 2.15      REL-XR   Level 1    Speed 50    Minutes 15    METs 2.7          Nutrition:  Target Goals: Understanding of nutrition guidelines, daily intake of sodium 1500mg , cholesterol 200mg , calories 30% from fat and 7% or less from saturated fats, daily to have 5 or more servings of fruits and vegetables.  Biometrics:    Nutrition Therapy Plan and Nutrition Goals:   Nutrition Assessments:  MEDIFICTS Score Key: >=70 Need to make dietary changes  40-70 Heart Healthy Diet <= 40 Therapeutic Level Cholesterol Diet  Flowsheet Row CARDIAC REHAB PHASE II ORIENTATION from 08/02/2024 in [facility] CARDIAC RE[facility]ILITATION  Picture Your Plate Total Score on Admission 75   Picture Your Plate Scores: <59 Unhealthy dietary pattern with much room for improvement. 41-50 Dietary pattern unlikely to meet recommendations for good health and room for improvement. 51-60 More healthful dietary pattern, with some room for improvement.  >60 Healthy dietary pattern, although there may be some specific behaviors that could be improved.    Nutrition Goals Re-Evaluation:  Nutrition Goals Re-Evaluation     Row Name 09/22/24 [phone]             Goals   Nutrition Goal Healthy eating       Comment Jonathan Fowler states his diet is going well. He drinks enough water , sometimes too much he states. He is incorporating more fresh fruits and veggies.       Expected Outcome Short: Continue to attend rehab. Long: Continue to drink adequate amount of water  but not too much.  Nutrition Goals Discharge (Final Nutrition Goals Re-Evaluation):  Nutrition Goals Re-Evaluation - 09/22/24 0834       Goals   Nutrition Goal Healthy eating    Comment Jonathan Fowler states his diet is going well. He drinks enough water , sometimes too much he states. He is incorporating more fresh fruits and veggies.    Expected Outcome Short: Continue to attend rehab. Long: Continue to drink adequate amount of water  but not too much.          Psychosocial: Target Goals: Acknowledge presence or absence of significant depression and/or stress, maximize coping skills, provide positive support system. Participant is able to verbalize types and ability to use techniques and skills needed for reducing stress and depression.  Initial Review & Psychosocial Screening:   Quality of Life Scores:  Scores of 19 and below usually indicate a poorer quality of life in these areas.  A difference of  2-3 points is a clinically meaningful difference.  A difference of 2-3 points in the total score of the Quality of Life Index has been associated with significant improvement in overall quality of life, self-image, physical symptoms, and general health in studies assessing change in quality of life.  PHQ-9: Review Flowsheet       08/02/2024  Depression screen PHQ 2/9  Decreased Interest 1  Down, Depressed, Hopeless 0  PHQ - 2 Score 1  Altered sleeping 0  Tired, decreased energy 1  Change in appetite 0  Feeling bad or failure about yourself  0  Trouble concentrating 0  Moving slowly or fidgety/restless 0  Suicidal thoughts 0  PHQ-9 Score 2   Difficult doing work/chores Not difficult at all    Details       Data saved with a previous flowsheet row definition        Interpretation of Total Score  Total Score Depression Severity:  1-4 = Minimal depression, 5-9 = Mild depression, 10-14 = Moderate depression, 15-19 = Moderately severe depression, 20-27 = Severe depression   Psychosocial  Evaluation and Intervention:   Psychosocial Re-Evaluation:  Psychosocial Re-Evaluation     Row Name 09/22/24 [phone]             Psychosocial Re-Evaluation   Current issues with Current Sleep Concerns       Comments Jonathan Fowler is doing well in rehab! He states there is no current stressors going on at the moment. He is having some sleep issues, he only sleeps about 4 hours a night. He had a sleep study the other day and he may be getting a new CPAP mask, which he feels would help him sleep better.       Expected Outcomes Short; Continue to attend rehab. Long: Follow MD directions regarding sleep study results.       Interventions Encouraged to attend Cardiac Rehabilitation for the exercise       Continue Psychosocial Services  Follow up required by staff          Psychosocial Discharge (Final Psychosocial Re-Evaluation):  Psychosocial Re-Evaluation - 09/22/24 9167       Psychosocial Re-Evaluation   Current issues with Current Sleep Concerns    Comments Jonathan Fowler is doing well in rehab! He states there is no current stressors going on at the moment. He is having some sleep issues, he only sleeps about 4 hours a night. He had a sleep study the other day and he may be getting a new CPAP mask, which he feels would help him sleep better.  Expected Outcomes Short; Continue to attend rehab. Long: Follow MD directions regarding sleep study results.    Interventions Encouraged to attend Cardiac Rehabilitation for the exercise    Continue Psychosocial Services  Follow up required by staff          Vocational Rehabilitation: Provide vocational rehab assistance to qualifying candidates.   Vocational Rehab Evaluation & Intervention:   Education: Education Goals: Education classes will be provided on a weekly basis, covering required topics. Participant will state understanding/return demonstration of topics presented.  Learning Barriers/Preferences:   Education Topics: Hypertension,  Hypertension Reduction -Define heart disease and high blood pressure. Discus how high blood pressure affects the body and ways to reduce high blood pressure.   Exercise and Your Heart -Discuss why it is important to exercise, the FITT principles of exercise, normal and abnormal responses to exercise, and how to exercise safely.   Angina -Discuss definition of angina, causes of angina, treatment of angina, and how to decrease risk of having angina.   Cardiac Medications -Review what the following cardiac medications are used for, how they affect the body, and side effects that may occur when taking the medications.  Medications include Aspirin , Beta blockers, calcium  channel blockers, ACE Inhibitors, angiotensin receptor blockers, diuretics, digoxin , and antihyperlipidemics.   Congestive Heart Failure -Discuss the definition of CHF, how to live with CHF, the signs and symptoms of CHF, and how keep track of weight and sodium intake.   Heart Disease and Intimacy -Discus the effect sexual activity has on the heart, how changes occur during intimacy as we age, and safety during sexual activity.   Smoking Cessation / COPD -Discuss different methods to quit smoking, the health benefits of quitting smoking, and the definition of COPD.   Nutrition I: Fats -Discuss the types of cholesterol, what cholesterol does to the heart, and how cholesterol levels can be controlled.   Nutrition II: Labels -Discuss the different components of food labels and how to read food label   Heart Parts/Heart Disease and PAD -Discuss the anatomy of the heart, the pathway of blood circulation through the heart, and these are affected by heart disease.   Stress I: Signs and Symptoms -Discuss the causes of stress, how stress may lead to anxiety and depression, and ways to limit stress.   Stress II: Relaxation -Discuss different types of relaxation techniques to limit stress.   Warning Signs of Stroke /  TIA -Discuss definition of a stroke, what the signs and symptoms are of a stroke, and how to identify when someone is having stroke.   Knowledge Questionnaire Score:   Core Components/Risk Factors/Patient Goals at Admission:   Core Components/Risk Factors/Patient Goals Review:   Goals and Risk Factor Review     Row Name 09/22/24 0836             Core Components/Risk Factors/Patient Goals Review   Personal Goals Review Improve shortness of breath with ADL's;Hypertension;Heart Failure;Lipids       Review Jonathan Fowler is doing well in rehab! Sometimes he has to miss due to work, but he is now coming 3 days a week! He states he checks his BP and weight every morning and has a log to document them in. He also takes all his medicines as prescribed.       Expected Outcomes Short: Continue to attend rehab. Long: Continue checking weight and vitals at home and report any abnormalities to MD.          Core Components/Risk Factors/Patient Goals at  Discharge (Final Review):   Goals and Risk Factor Review - 09/22/24 0836       Core Components/Risk Factors/Patient Goals Review   Personal Goals Review Improve shortness of breath with ADL's;Hypertension;Heart Failure;Lipids    Review Jonathan Fowler is doing well in rehab! Sometimes he has to miss due to work, but he is now coming 3 days a week! He states he checks his BP and weight every morning and has a log to document them in. He also takes all his medicines as prescribed.    Expected Outcomes Short: Continue to attend rehab. Long: Continue checking weight and vitals at home and report any abnormalities to MD.          ITP Comments:  ITP Comments     Row Name 09/14/24 0833 10/12/24 1027 10/13/24 0942       ITP Comments 30 day review completed. ITP sent to Dr. Dorn Ross, Medical Director of Cardiac Rehab. Continue with ITP unless changes are made by physician. Unable to get goals this round due to lack of attendance and admission.  He last  attended on 08/25/24. 30 day review completed. ITP sent to Dr. Dorn Ross, Medical Director of Cardiac Rehab. Continue with ITP unless changes are made by physician. Attendance has been intermitten due to medication adjustments. Called patient regarding missed cardiac rehab sessions. He continues to have lower extremity edema and wants to cancel his sessions until he sees Dr. Zenaida 10/28/23. He says he will contact us  after his visit with Dr. Zenaida.        Comments: Discharge ITP    [1]  Current Outpatient Medications:    amiodarone  (PACERONE ) 200 MG tablet, Take 1 tablet (200 mg total) by mouth daily., Disp: 90 tablet, Rfl: 3   aspirin  EC 81 MG tablet, Take 1 tablet (81 mg total) by mouth daily. Swallow whole., Disp: 30 tablet, Rfl: 12   atorvastatin  (LIPITOR) 10 MG tablet, Take 1 tablet (10 mg total) by mouth daily., Disp: 90 tablet, Rfl: 3   Cholecalciferol (VITAMIN D3) 50 MCG (2000 UT) capsule, Take 2,000 Units by mouth daily., Disp: , Rfl:    empagliflozin  (JARDIANCE ) 10 MG TABS tablet, Take 1 tablet (10 mg total) by mouth daily., Disp: 30 tablet, Rfl: 11   glucosamine-chondroitin 500-400 MG tablet, Take 1 tablet by mouth in the morning and at bedtime., Disp: , Rfl:    hydrALAZINE  (APRESOLINE ) 25 MG tablet, Take 1 tablet (25 mg total) by mouth every 8 (eight) hours., Disp: 90 tablet, Rfl: 11   metFORMIN (GLUCOPHAGE) 500 MG tablet, Take 500 mg by mouth in the morning and at bedtime., Disp: , Rfl:    milrinone  (PRIMACOR ) 20 MG/100 ML SOLN infusion, Inject 0.0247 mg/min into the vein continuous. Dosing weight 98 kg, Disp: , Rfl:    Multiple Vitamins-Minerals (CENTRUM SILVER 50+MEN) TABS, Take 1 tablet by mouth daily., Disp: , Rfl:    potassium chloride  SA (KLOR-CON  M) 20 MEQ tablet, Take 3 tablets (60 mEq total) by mouth once., Disp: 90 tablet, Rfl: 5   spironolactone  (ALDACTONE ) 25 MG tablet, Take 0.5 tablets (12.5 mg total) by mouth daily., Disp: 15 tablet, Rfl: 11   torsemide   (DEMADEX ) 20 MG tablet, Take 2 tablets (40 mg total) by mouth 2 (two) times daily. Weigh yourself daily - if you gain three pounds overnight or five pounds in a week, take an extra tablet., Disp: 120 tablet, Rfl: 11 [2]  Social History Tobacco Use  Smoking Status Never  Smokeless Tobacco Never

## 2024-11-10 ENCOUNTER — Encounter (HOSPITAL_COMMUNITY)

## 2024-11-12 ENCOUNTER — Telehealth (HOSPITAL_COMMUNITY): Payer: Self-pay | Admitting: Cardiology

## 2024-11-12 ENCOUNTER — Ambulatory Visit (HOSPITAL_COMMUNITY)

## 2024-11-12 DIAGNOSIS — I5022 Chronic systolic (congestive) heart failure: Secondary | ICD-10-CM

## 2024-11-12 NOTE — Telephone Encounter (Signed)
 Patient called to check the status of referrals placed at last OV  Educated on referral process to [facility] Given number to check the status directly with [facility]  Patient reports palliative referral was to be placed -advised OV with provider does not state this however will confirm with a provider  Per Jordan Lee,PA Ok to place palliative referral

## 2024-11-15 ENCOUNTER — Encounter (HOSPITAL_COMMUNITY)

## 2024-11-17 ENCOUNTER — Ambulatory Visit (HOSPITAL_COMMUNITY)
Admission: RE | Admit: 2024-11-17 | Discharge: 2024-11-17 | Disposition: A | Source: Ambulatory Visit | Attending: Cardiology | Admitting: Cardiology

## 2024-11-17 ENCOUNTER — Other Ambulatory Visit (HOSPITAL_COMMUNITY): Payer: Self-pay

## 2024-11-17 ENCOUNTER — Encounter (HOSPITAL_COMMUNITY): Payer: Self-pay | Admitting: Cardiology

## 2024-11-17 ENCOUNTER — Encounter (HOSPITAL_COMMUNITY)

## 2024-11-17 VITALS — BP 170/90 | HR 78 | Wt 201.6 lb

## 2024-11-17 DIAGNOSIS — I5022 Chronic systolic (congestive) heart failure: Secondary | ICD-10-CM

## 2024-11-17 MED ORDER — TORSEMIDE 20 MG PO TABS: 40.0000 mg | ORAL_TABLET | Freq: Every day | ORAL | Status: AC

## 2024-11-17 MED ORDER — POTASSIUM CHLORIDE CRYS ER 20 MEQ PO TBCR: 40.0000 meq | EXTENDED_RELEASE_TABLET | Freq: Every day | ORAL | Status: AC

## 2024-11-17 MED ORDER — SACUBITRIL-VALSARTAN 24-26 MG PO TABS: 1.0000 | ORAL_TABLET | Freq: Two times a day (BID) | ORAL | 3 refills | Status: AC

## 2024-11-17 NOTE — Patient Instructions (Signed)
 START Entresto  24/26 mg Twice daily  CHANGE Torsemide  to 40 mg daily.  CHANGE Potassium to 40 mEq Daily.  STOP Hydralazine .  Please follow up with our heart failure pharmacist in 2 weeks.  Your physician recommends that you schedule a follow-up appointment in: 2 months.  If you have any questions or concerns before your next appointment please send us  a message through [place] or call our office at [phone].    TO LEAVE A MESSAGE FOR THE NURSE SELECT OPTION 2, PLEASE LEAVE A MESSAGE INCLUDING: YOUR NAME DATE OF BIRTH CALL BACK NUMBER REASON FOR CALL**this is important as we prioritize the call backs  YOU WILL RECEIVE A CALL BACK THE SAME DAY AS LONG AS YOU CALL BEFORE 4:00 PM  At the [facility], you and your health needs are our priority. As part of our continuing mission to provide you with exceptional heart care, we have created designated Provider Care Teams. These Care Teams include your primary Cardiologist (physician) and Advanced Practice Providers (APPs- Physician Assistants and Nurse Practitioners) who all work together to provide you with the care you need, when you need it.   You may see any of the following providers on your designated Care Team at your next follow up: Dr Toribio Fuel Dr Ezra Shuck Dr. Morene Brownie Greig Mosses, NP Caffie Shed, [place] [facility] [place], [place] Beckey Coe, NP Jordan Lee, NP Ellouise Class, NP Tinnie Redman, PharmD Jaun Bash, PharmD   Please be sure to bring in all your medications bottles to every appointment.    Thank you for choosing [facility]

## 2024-11-18 ENCOUNTER — Ambulatory Visit (HOSPITAL_COMMUNITY): Admitting: Cardiology

## 2024-11-18 ENCOUNTER — Telehealth (HOSPITAL_COMMUNITY): Payer: Self-pay

## 2024-11-18 NOTE — Telephone Encounter (Signed)
 Advanced Heart Failure Patient Advocate Encounter  Test billing for this patient's current coverage ([employer]) returns a $30 copay for 90 day supply of Sacubitril -Valsartan . Brand Entresto  not covered.  This test claim was processed through [facility]- copay amounts may vary at other pharmacies due to pharmacy/plan contracts, or as the patient moves through the different stages of their insurance plan.  Rachel DEL, CPhT Rx Patient Advocate Phone: [phone]

## 2024-11-19 ENCOUNTER — Ambulatory Visit (HOSPITAL_COMMUNITY)

## 2024-11-19 NOTE — Progress Notes (Signed)
 "  ADVANCED HEART FAILURE FOLLOW UP CLINIC NOTE  Referring Physician: Regino Slater, MD  Primary Care: Regino Slater, MD Primary Cardiologist:  HPI: Jonathan Fowler is a 69 y.o. male who presents for follow up of chronic systolic heart failure.      Admitted 9/25 with acute systolic heart failure. Took BP meds at home PTA and reported full compliance with them. Echo showed EF <20%, AHF consulted.  Underwent RHC 07/02/2024 with severely elevated biventricular filling pressures and reduced cardiac index, LHC deferred with elevated renal function. Diuresed well with IV lasix , renal function improved. cMRI with NiCM BiV HF, LVEF 15%.As renal function improved, underwent [facility] 07/05/24 with nonobs CAD, elevated filling pressures and preserved CO. Required further diuresis and GDMT titrated. He was discharged home, weight 213 lbs.   Patient readmitted with worsening fatigue, shortness of breath, and volume overload. RHC showed significantly elevated filling pressures but preserved cardiac index on 0.25 of milrinone . Plan was made for CRT-D placement inpatient after inotrope wean. Unfortunately worsened after inotropes weaned, placed back on milrinone  at discharge.      SUBJECTIVE:   Patient with significant improvement in his symptoms of shortness of breath and fatigue since discharge. He has been taking his medications as scheduled, though his BP has not been as high as it was in clinic today. He states that he feels much more active and his weight is the best it has been in years. He reports no issues with the tunneled line or ICD. Interrogation reviewed, no significant issues or treated arrhythmias, good pacing burden.   Discussed benefit of palliative care referral as well as [facility] txp referral. Hopeful that between medical therapy, CRT-D, and inotrope use he may improve enough to not require advanced therapies, but given age is reasonable to establish care there now.   PMH, current  medications, allergies, social history, and family history reviewed in epic.  PHYSICAL EXAM: Vitals:   11/17/24 1050  BP: (!) 170/90  Pulse: 78  SpO2: 98%   GENERAL: NAD, well appearing PULM:  Normal work of breathing, CTAB CARDIAC:  JVP: flat         Normal rate with regular rhythm. No murmurs, rubs or gallops.  No edema. Warm and well perfused extremities. ABDOMEN: Soft, non-tender, non-distended. NEUROLOGIC: Patient is oriented x3 with no focal or lateralizing neurologic deficits.   DATA REVIEW  ECG: Sinus rhythm, first-degree AV block, right bundle branch block with QRS duration 174  ECHO: 06/2024: LVEF less than 20%, grade 2 diastolic dysfunction, RV moderately reduced 09/06/2024: Severe biventricular failure, LVEF less than 20%, severely reduced RV systolic function, no significant valvular disease  CATH: 07/05/2024: Nonobstructive CAD, RA 11, PA 67/31 (42), PCWP 21, compensated assumed Fick CO/CI 10/22/2024: RA 13, PA 54/24 (34), PCWP 24 with v waves to 34, TD CO/CI 5.14/2.34 on 0.25 of milrinone   CMR: 06/2024: LVEF 16%, RVEF 29%, no evidence of infiltrative disease, nonischemic cardiomyopathy  ASSESSMENT & PLAN:  Chronic systolic heart failure: Severely reduced biventricular function, ACC/AHA stage D cardiomyopathy now on home inotropes. Now NYHA class II, significantly improved after inotropes and diuresis. Weight now around 200-205.   - Best I have seen him look, doing very well with home inotropes - Given improving volume status and stable creatinine at discharge, restart entresto  24/26mg  BID - Stop hydralazine  - Continue jardiance  10mg  daily - follow up with pharmacy in 2 weeks for labs and titration if needed - Continue milrinone  0.25 currently - Did not tolerate digoxin  -  With weight and improving symptoms reduce torsemide  back to 40mg  daily - Reduce Kcl to 40mEq daily - Continue spironolactone  12.5mg  daily - Repeat echo after BP  controlled  Hypertension: -Significantly elevated today, titration as above  CKD IIIa - Followed by Dr Tobie.  - Baseline Scr previously 1.4-1.7, has been in the low 2s more recently with diuresis and low output HF - Repeat at next visit  Follow up in 2 weeks with pharmacy  I spent 43 minutes caring for this patient today including face to face time, ordering and reviewing labs, reviewing records from last hospital stay, device interrogation, discussing [facility] Txp referral, seeing the patient, documenting in the record, and arranging follow ups.    Morene Brownie, MD Advanced Heart Failure Mechanical Circulatory Support 11/19/24 "

## 2024-11-22 ENCOUNTER — Encounter (HOSPITAL_COMMUNITY)

## 2024-11-24 ENCOUNTER — Encounter (HOSPITAL_COMMUNITY)

## 2024-11-29 ENCOUNTER — Encounter (HOSPITAL_COMMUNITY)

## 2024-11-30 ENCOUNTER — Ambulatory Visit (HOSPITAL_COMMUNITY)

## 2024-12-01 ENCOUNTER — Encounter (HOSPITAL_COMMUNITY)

## 2024-12-03 ENCOUNTER — Ambulatory Visit

## 2024-12-06 ENCOUNTER — Encounter (HOSPITAL_COMMUNITY)

## 2024-12-08 ENCOUNTER — Encounter (HOSPITAL_COMMUNITY)

## 2025-02-02 ENCOUNTER — Ambulatory Visit (HOSPITAL_COMMUNITY): Admitting: Cardiology

## 2025-02-07 ENCOUNTER — Ambulatory Visit: Admitting: Pulmonary Disease

## 2025-03-04 ENCOUNTER — Ambulatory Visit

## 2025-06-03 ENCOUNTER — Ambulatory Visit

## 2025-09-02 ENCOUNTER — Ambulatory Visit

## 2025-12-02 ENCOUNTER — Ambulatory Visit
